# Patient Record
Sex: Male | Born: 1950 | Race: White | Hispanic: No | Smoking: Former smoker
Health system: Southern US, Community
[De-identification: ages and names within clinical notes are randomized; demographics above are authoritative.]

## PROBLEM LIST (undated history)

## (undated) DIAGNOSIS — D494 Neoplasm of unspecified behavior of bladder: Secondary | ICD-10-CM

## (undated) DIAGNOSIS — K219 Gastro-esophageal reflux disease without esophagitis: Secondary | ICD-10-CM

## (undated) DIAGNOSIS — Z974 Presence of external hearing-aid: Secondary | ICD-10-CM

## (undated) DIAGNOSIS — K824 Cholesterolosis of gallbladder: Secondary | ICD-10-CM

## (undated) DIAGNOSIS — R001 Bradycardia, unspecified: Secondary | ICD-10-CM

## (undated) DIAGNOSIS — Z972 Presence of dental prosthetic device (complete) (partial): Secondary | ICD-10-CM

## (undated) DIAGNOSIS — F1911 Other psychoactive substance abuse, in remission: Secondary | ICD-10-CM

## (undated) DIAGNOSIS — M1990 Unspecified osteoarthritis, unspecified site: Secondary | ICD-10-CM

## (undated) DIAGNOSIS — J449 Chronic obstructive pulmonary disease, unspecified: Secondary | ICD-10-CM

## (undated) DIAGNOSIS — I340 Nonrheumatic mitral (valve) insufficiency: Secondary | ICD-10-CM

## (undated) DIAGNOSIS — Z5189 Encounter for other specified aftercare: Secondary | ICD-10-CM

## (undated) DIAGNOSIS — R319 Hematuria, unspecified: Secondary | ICD-10-CM

## (undated) DIAGNOSIS — D649 Anemia, unspecified: Secondary | ICD-10-CM

## (undated) DIAGNOSIS — R011 Cardiac murmur, unspecified: Secondary | ICD-10-CM

## (undated) DIAGNOSIS — Z7901 Long term (current) use of anticoagulants: Secondary | ICD-10-CM

## (undated) DIAGNOSIS — I499 Cardiac arrhythmia, unspecified: Secondary | ICD-10-CM

## (undated) DIAGNOSIS — M810 Age-related osteoporosis without current pathological fracture: Secondary | ICD-10-CM

## (undated) HISTORY — PX: BROW LIFT AND BLEPHAROPLASTY: SHX1271

## (undated) HISTORY — PX: CATARACT EXTRACTION: SUR2

## (undated) HISTORY — PX: HERNIA REPAIR: SHX51

## (undated) HISTORY — PX: INGUINAL HERNIA REPAIR: SUR1180

## (undated) HISTORY — PX: MANDIBLE RECONSTRUCTION: SHX431

## (undated) HISTORY — PX: APPENDECTOMY: SHX54

## (undated) HISTORY — PX: MIDDLE EAR SURGERY: SHX713

## (undated) HISTORY — PX: HEMORROIDECTOMY: SUR656

## (undated) HISTORY — PX: UMBILICAL HERNIA REPAIR: SHX196

---

## 2002-05-16 ENCOUNTER — Emergency Department (HOSPITAL_COMMUNITY): Admission: EM | Admit: 2002-05-16 | Discharge: 2002-05-16 | Payer: Self-pay | Admitting: *Deleted

## 2007-10-10 HISTORY — PX: HEMORROIDECTOMY: SUR656

## 2008-08-19 ENCOUNTER — Ambulatory Visit: Payer: Self-pay | Admitting: Cardiology

## 2008-11-11 ENCOUNTER — Encounter (INDEPENDENT_AMBULATORY_CARE_PROVIDER_SITE_OTHER): Payer: Self-pay | Admitting: General Surgery

## 2008-11-11 ENCOUNTER — Observation Stay (HOSPITAL_COMMUNITY): Admission: RE | Admit: 2008-11-11 | Discharge: 2008-11-12 | Payer: Self-pay | Admitting: General Surgery

## 2010-07-28 ENCOUNTER — Emergency Department (HOSPITAL_COMMUNITY): Admission: EM | Admit: 2010-07-28 | Discharge: 2010-07-28 | Payer: Self-pay | Admitting: Emergency Medicine

## 2011-01-24 LAB — CBC
HCT: 42.2 % (ref 39.0–52.0)
Hemoglobin: 13.8 g/dL (ref 13.0–17.0)
MCHC: 32.7 g/dL (ref 30.0–36.0)
RDW: 21.9 % — ABNORMAL HIGH (ref 11.5–15.5)

## 2011-01-24 LAB — BASIC METABOLIC PANEL
CO2: 32 mEq/L (ref 19–32)
Calcium: 9.4 mg/dL (ref 8.4–10.5)
Glucose, Bld: 98 mg/dL (ref 70–99)
Potassium: 4.1 mEq/L (ref 3.5–5.1)
Sodium: 141 mEq/L (ref 135–145)

## 2011-01-24 LAB — DIFFERENTIAL
Basophils Absolute: 0 10*3/uL (ref 0.0–0.1)
Basophils Relative: 1 % (ref 0–1)
Eosinophils Relative: 4 % (ref 0–5)
Monocytes Absolute: 0.5 10*3/uL (ref 0.1–1.0)

## 2011-02-21 NOTE — Op Note (Signed)
NAMEJAYDIEN, Kenneth [name]                 ACCOUNT NO.:  [id number]   MEDICAL RECORD NO.:  [id number]          PATIENT TYPE:  OIB   LOCATION:  A309                          FACILITY:  [facility]   PHYSICIAN:  Barbaraann Barthel, M.D. DATE OF BIRTH:  July 31, 1951   DATE OF PROCEDURE:  DATE OF DISCHARGE:                               OPERATIVE REPORT   SURGEON:  Barbaraann Barthel, MD   PREOPERATIVE DIAGNOSIS:  Prolapsed internal hemorrhoids and external  hemorrhoids new (left lateral and right posterior bundles).   POSTOPERATIVE DIAGNOSIS:  Prolapsed internal hemorrhoids and external  hemorrhoids new (left lateral and right posterior bundles).   PROCEDURE:  Excision of prolapsed internal and external hemorrhoids  (left lateral and right posterior bundles).   SPECIMEN:  Hemorrhoids.   WOUND CLASSIFICATION:  Clean contaminated.   NOTE:  This is a 60 year old white male who [facility] because of recurring prolapsing and bleeding internal  hemorrhoids.  We had planned for elective surgery with him discussing  the complications not limited to but including bleeding, infection, and  recurrence.  We did discuss the possibility of some minor anal stricture  which hopefully will be avoided with proper maneuvers after surgery.  Informed consent was obtained.  It should be stated that this patient  had undergone a colonoscopy in November 2009, so we did not perform any  rigid proctoscopy.   GROSS OPERATIVE FINDINGS:  Those consistent with prolapsed internal and  external hemorrhoids located the left lateral and the right posterior  bundle area.   TECHNIQUE:  The patient was placed in the jackknife prone position after  the adequate administration of spinal anesthesia.  His perianal area was  prepped with Betadine solution and draped in the usual manner after the  buttocks were separated somewhat with wide tape separating them.  We  then excised the area around the left lateral  bundle and elevated this  and put a Buie clamp across this and removed the bundle with the cautery  device and sharply with the scalpel.  This was oversewn over the Buie  clamp from internal to external and then the clamp was removed and then  this was oversewn with a locking suture from external to the internal  direction.  After suturing and checking for hemostasis, attention was  then turned to the right posterior bundle where exactly the same  procedure was taken care of where the bundle was removed in likewise  manner, oversewing this over a oblique lamp after removing it in a  double fashion.  We checked for hemostasis.  The wound was irrigated  with saline solution and dilute Betadine  solution.  I used a perianal block with 10 mL of 0.5% Sensorcaine for  postoperative comfort and then placed a viscous Xylocaine dressing over  the wound.  The patient will be admitted for observation.  Estimated  blood loss was minimal.      Barbaraann Barthel, M.D.  Electronically Signed     WB/MEDQ  D:  11/11/2008  T:  11/11/2008  Job:  [id number]   cc:  [facility]

## 2011-08-30 ENCOUNTER — Encounter (HOSPITAL_COMMUNITY): Payer: Self-pay | Admitting: Pharmacy Technician

## 2011-09-05 NOTE — Consult Note (Signed)
NAMEVIAN, Kenneth [name] NO.:  [id number]  MEDICAL RECORD NO.:  [id number]  LOCATION:                                 FACILITY:  PHYSICIAN:  Barbaraann Barthel, M.D. DATE OF BIRTH:  1950/10/10  DATE OF CONSULTATION:  09/04/2011 DATE OF DISCHARGE:                                CONSULTATION   DIAGNOSIS:  Right inguinal hernia, recurrent.  HISTORY OF PRESENT MEDICAL ILLNESS:  The patient stated that he had repair previously in [facility] by Dr. Cleotis Nipper in the 1980s. Whether or not this was done by with a mesh prosthesis,  we are not certain and currently trying to obtain old medical records.  At any rate, the patient recently underwent some straining while moving some heavy material and noticed a lump in his groin and clinically, he has a mass in his right groin, consistent with a recurrence.  He was seen first at the [facility] who referred him to [place].  PHYSICAL EXAMINATION:  VITAL SIGNS:  He is 6 feet 2 inches, weighs 170 pounds.  His temperature is 97.6, his pulse rate is 46 and regular, respirations 12, blood pressure 130/82. HEENT:  Head is normocephalic.  Eyes, extraocular movements were intact. Pupils were round and reactive to light and accommodation.  There was no conjunctival pallor or scleral injection.  The sclerae has a normal tincture.  The patient has dental prosthesis and he has had a past history of a jaw reconstruction with bone grafts.  He has no cervical adenopathy and no thyromegaly and as stated, no bruits are auscultated. CHEST:  Clear to both anterior and posterior auscultation. HEART:  Regular rhythm. ABDOMEN:  Soft.  The patient has a recurrent right inguinal hernia. RECTAL:  The stool is guaiac negative.  There is a hard prostate on palpation. EXTREMITIES:  Within normal limits.  REVIEW OF SYSTEMS:  NEUROLOGIC:  No migraines, no seizures.  ENDOCRINE: No history of diabetes or thyroid disease.  CARDIOPULMONARY:   No history of chest pain or cardiac problems.  MUSCULOSKELETAL:  History of previous jaw reconstruction and he had previously broke both arms.  GI: No history of hepatitis, constipation, diarrhea, bright red rectal bleeding, or black tarry stools.  No history of inflammatory bowel disease or irritable bowel syndrome.  No history of unexplained weight loss.  He had a colonoscopy done in 2009.  GU:  No history of frequency or dysuria.  No history of kidney stones and as stated, he has rather firm, nodular prostate on examination.  We are obtaining PSA level on this.  PAST SURGICAL HISTORY:  Includes right inguinal hernia repair in 1980s, an umbilical hernia repair in 1983, jaw reconstruction in 2009, hemorrhoidectomy as well in 2009, and he had an appendectomy in 1964.  MEDICATIONS:  He is currently on no medications.  ALLERGIES:  He has no known allergies.  LABORATORY DATA:  Pending.  We will plan for surgery on him on elective basis and after which, he has returned to the [facility] for his medical followup after I finished following him perioperatively.     Barbaraann Barthel, M.D.     WB/MEDQ  D:  09/04/2011  T:  09/04/2011  Job:  [id number]  cc:   [facility]

## 2011-09-08 ENCOUNTER — Encounter (HOSPITAL_COMMUNITY): Payer: Self-pay

## 2011-09-08 ENCOUNTER — Encounter (HOSPITAL_COMMUNITY)
Admission: RE | Admit: 2011-09-08 | Discharge: 2011-09-08 | Disposition: A | Discharge status: Home / Self Care | Payer: Self-pay | Source: Ambulatory Visit | Attending: General Surgery | Admitting: General Surgery

## 2011-09-08 ENCOUNTER — Other Ambulatory Visit: Payer: Self-pay

## 2011-09-08 MED ORDER — CHLORHEXIDINE GLUCONATE 4 % EX LIQD: Freq: Once | CUTANEOUS | Status: DC

## 2011-09-08 NOTE — Patient Instructions (Signed)
20 Kenneth [name]  09/08/2011  Your procedure is scheduled on:  09/11/11  Report to [facility] at 0920 AM.  Call this number if you have problems the morning of surgery: [phone]   Remember:   Do not eat food:After Midnight.  May have clear liquids:until Midnight .  Clear liquids include soda, tea, black coffee, apple or grape juice, broth.  Take these medicines the morning of surgery with A SIP OF WATER: none   Do not wear jewelry, make-up or nail polish.  Do not wear lotions, powders, or perfumes. You may wear deodorant.  Do not shave 48 hours prior to surgery.  Do not bring valuables to the hospital.  Contacts, dentures or bridgework may not be worn into surgery.  Leave suitcase in the car. After surgery it may be brought to your room.  For patients admitted to the hospital, checkout time is 11:00 AM the day of discharge.   Patients discharged the day of surgery will not be allowed to drive home.  Name and phone number of your driver: driver  Special Instructions: CHG Shower Use Special Wash: 1/2 bottle night before surgery and 1/2 bottle morning of surgery.   Please read over the following fact sheets that you were given: MRSA Information, Surgical Site Infection Prevention, Anesthesia Post-op Instructions and Care and Recovery After Surgery   Inguinal Hernia, Adult  Care After Refer to this sheet in the next few weeks. These discharge instructions provide you with general information on caring for yourself after you leave the hospital. Your caregiver may also give you specific instructions. Your treatment has been planned according to the most current medical practices available, but unavoidable complications sometimes occur. If you have any problems or questions after discharge, please call your caregiver. HOME CARE INSTRUCTIONS  Put ice on the operative site.   Put ice in a plastic bag.   Place a towel between your skin and the bag.   Leave the ice on for 15 to 20 minutes  at a time, 3 to 4 times a day while awake.   Change bandages (dressings) as directed.   Keep the wound dry and clean. The wound may be washed gently with soap and water. Gently blot or dab the wound dry. It is okay to take showers 24 to 48 hours after surgery. Do not take baths, use swimming pools, or use hot tubs for 10 days, or as directed by your caregiver.   Only take over-the-counter or prescription medicines for pain, discomfort, or fever as directed by your caregiver.   Continue your normal diet as directed.   Do not lift anything more than 10 pounds or play contact sports for 3 weeks, or as directed.  SEEK MEDICAL CARE IF:  There is redness, swelling, or increasing pain in the wound.   There is fluid (pus) coming from the wound.   There is drainage from a wound lasting longer than 1 day.   You have an oral temperature above 102 F (38.9 C).   You notice a bad smell coming from the wound or dressing.   The wound breaks open after the stitches (sutures) have been removed.   You notice increasing pain in the shoulders (shoulder strap areas).   You develop dizzy episodes or fainting while standing.   You feel sick to your stomach (nauseous) or throw up (vomit).  SEEK IMMEDIATE MEDICAL CARE IF:  You develop a rash.   You have difficulty breathing.   You develop a  reaction or have side effects to medicines you were given.  MAKE SURE YOU:   Understand these instructions.   Will watch your condition.   Will get help right away if you are not doing well or get worse.  Document Released: 10/26/2006 Document Revised: 06/07/2011 Document Reviewed: 08/25/2009 [facility] Patient Information 2012 [place], [place].

## 2011-09-11 ENCOUNTER — Ambulatory Visit (HOSPITAL_COMMUNITY): Payer: Self-pay | Admitting: Anesthesiology

## 2011-09-11 ENCOUNTER — Other Ambulatory Visit: Payer: Self-pay | Admitting: General Surgery

## 2011-09-11 ENCOUNTER — Encounter (HOSPITAL_COMMUNITY): Payer: Self-pay | Admitting: *Deleted

## 2011-09-11 ENCOUNTER — Observation Stay (HOSPITAL_COMMUNITY)
Admission: RE | Admit: 2011-09-11 | Discharge: 2011-09-12 | Disposition: A | Discharge status: Home / Self Care | Payer: Self-pay | Source: Ambulatory Visit | Attending: General Surgery | Admitting: General Surgery

## 2011-09-11 ENCOUNTER — Encounter (HOSPITAL_COMMUNITY)
Admission: RE | Disposition: A | Discharge status: Home / Self Care | Payer: Self-pay | Source: Ambulatory Visit | Attending: General Surgery

## 2011-09-11 ENCOUNTER — Encounter (HOSPITAL_COMMUNITY): Payer: Self-pay | Admitting: Anesthesiology

## 2011-09-11 DIAGNOSIS — I498 Other specified cardiac arrhythmias: Secondary | ICD-10-CM | POA: Insufficient documentation

## 2011-09-11 DIAGNOSIS — K4090 Unilateral inguinal hernia, without obstruction or gangrene, not specified as recurrent: Secondary | ICD-10-CM

## 2011-09-11 DIAGNOSIS — R001 Bradycardia, unspecified: Secondary | ICD-10-CM

## 2011-09-11 DIAGNOSIS — K4091 Unilateral inguinal hernia, without obstruction or gangrene, recurrent: Principal | ICD-10-CM | POA: Insufficient documentation

## 2011-09-11 HISTORY — DX: Cardiac arrhythmia, unspecified: I49.9

## 2011-09-11 HISTORY — DX: Encounter for other specified aftercare: Z51.89

## 2011-09-11 HISTORY — DX: Anemia, unspecified: D64.9

## 2011-09-11 HISTORY — PX: INGUINAL HERNIA REPAIR: SHX194

## 2011-09-11 SURGERY — REPAIR, HERNIA, INGUINAL, ADULT
Anesthesia: Spinal | Site: Abdomen | Laterality: Right | Wound class: Clean

## 2011-09-11 MED ORDER — MIDAZOLAM HCL 2 MG/2ML IJ SOLN
INTRAMUSCULAR | Status: AC
  Filled 2011-09-11: qty 2

## 2011-09-11 MED ORDER — SODIUM CHLORIDE 0.9 % IR SOLN
Status: DC | PRN
  Administered 2011-09-11: 1000 mL

## 2011-09-11 MED ORDER — BUPIVACAINE HCL (PF) 0.5 % IJ SOLN
INTRAMUSCULAR | Status: AC
  Filled 2011-09-11: qty 30

## 2011-09-11 MED ORDER — BUPIVACAINE HCL 0.75 % IJ SOLN
INTRAMUSCULAR | Status: DC | PRN
  Administered 2011-09-11: 11 mg via INTRATHECAL

## 2011-09-11 MED ORDER — MIDAZOLAM HCL 2 MG/2ML IJ SOLN
1.0000 mg | INTRAMUSCULAR | Status: DC | PRN
  Administered 2011-09-11 (×2): 2 mg via INTRAVENOUS

## 2011-09-11 MED ORDER — ACETAMINOPHEN 325 MG PO TABS: 650.0000 mg | ORAL_TABLET | Freq: Four times a day (QID) | ORAL | Status: DC | PRN

## 2011-09-11 MED ORDER — ONDANSETRON HCL 4 MG/2ML IJ SOLN: 4.0000 mg | Freq: Once | INTRAMUSCULAR | Status: DC | PRN

## 2011-09-11 MED ORDER — ACETAMINOPHEN 650 MG RE SUPP: 650.0000 mg | Freq: Four times a day (QID) | RECTAL | Status: DC | PRN

## 2011-09-11 MED ORDER — EPHEDRINE SULFATE 50 MG/ML IJ SOLN
INTRAMUSCULAR | Status: DC | PRN
  Administered 2011-09-11: 10 mg via INTRAVENOUS

## 2011-09-11 MED ORDER — CEFAZOLIN SODIUM 1-5 GM-% IV SOLN: 1.0000 g | INTRAVENOUS | Status: DC

## 2011-09-11 MED ORDER — MORPHINE SULFATE 4 MG/ML IJ SOLN
1.0000 mg | INTRAMUSCULAR | Status: DC | PRN
  Administered 2011-09-11: 1 mg via INTRAVENOUS
  Filled 2011-09-11: qty 1

## 2011-09-11 MED ORDER — BUPIVACAINE HCL (PF) 0.5 % IJ SOLN
INTRAMUSCULAR | Status: DC | PRN
  Administered 2011-09-11: 10 mL

## 2011-09-11 MED ORDER — CEFAZOLIN SODIUM 1-5 GM-% IV SOLN
INTRAVENOUS | Status: AC
  Filled 2011-09-11: qty 50

## 2011-09-11 MED ORDER — FENTANYL CITRATE 0.05 MG/ML IJ SOLN
INTRAMUSCULAR | Status: AC
  Filled 2011-09-11: qty 2

## 2011-09-11 MED ORDER — EPHEDRINE SULFATE 50 MG/ML IJ SOLN
INTRAMUSCULAR | Status: AC
  Filled 2011-09-11: qty 1

## 2011-09-11 MED ORDER — MIDAZOLAM HCL 5 MG/5ML IJ SOLN
INTRAMUSCULAR | Status: DC | PRN
  Administered 2011-09-11: 2 mg via INTRAVENOUS

## 2011-09-11 MED ORDER — DOCUSATE SODIUM 100 MG PO CAPS
100.0000 mg | ORAL_CAPSULE | Freq: Two times a day (BID) | ORAL | Status: DC
  Administered 2011-09-11 – 2011-09-12 (×2): 100 mg via ORAL
  Filled 2011-09-11 (×2): qty 1

## 2011-09-11 MED ORDER — CEFAZOLIN SODIUM 1-5 GM-% IV SOLN
INTRAVENOUS | Status: DC | PRN
  Administered 2011-09-11: 1 g via INTRAVENOUS

## 2011-09-11 MED ORDER — POTASSIUM CHLORIDE IN NACL 20-0.9 MEQ/L-% IV SOLN
INTRAVENOUS | Status: DC
  Administered 2011-09-11 – 2011-09-12 (×2): via INTRAVENOUS

## 2011-09-11 MED ORDER — FENTANYL CITRATE 0.05 MG/ML IJ SOLN: 25.0000 ug | INTRAMUSCULAR | Status: DC | PRN

## 2011-09-11 MED ORDER — LACTATED RINGERS IV SOLN
INTRAVENOUS | Status: DC
  Administered 2011-09-11: 10:00:00 via INTRAVENOUS

## 2011-09-11 MED ORDER — FENTANYL CITRATE 0.05 MG/ML IJ SOLN
INTRAMUSCULAR | Status: DC | PRN
  Administered 2011-09-11: 20 ug via INTRATHECAL
  Administered 2011-09-11: 50 ug via INTRAVENOUS

## 2011-09-11 MED ORDER — GLYCOPYRROLATE 0.2 MG/ML IJ SOLN
INTRAMUSCULAR | Status: DC | PRN
  Administered 2011-09-11: 0.2 mg via INTRAVENOUS

## 2011-09-11 MED ORDER — STERILE WATER FOR IRRIGATION IR SOLN
Status: DC | PRN
  Administered 2011-09-11: 2000 mL

## 2011-09-11 MED ORDER — BACITRACIN 50000 UNITS IM SOLR
INTRAMUSCULAR | Status: AC
  Filled 2011-09-11: qty 50000

## 2011-09-11 MED ORDER — PROPOFOL 10 MG/ML IV EMUL
INTRAVENOUS | Status: DC | PRN
  Administered 2011-09-11: 25 ug/kg/min via INTRAVENOUS

## 2011-09-11 MED ORDER — GLYCOPYRROLATE 0.2 MG/ML IJ SOLN
INTRAMUSCULAR | Status: AC
  Filled 2011-09-11: qty 1

## 2011-09-11 MED ORDER — BUPIVACAINE IN DEXTROSE 0.75-8.25 % IT SOLN
INTRATHECAL | Status: AC
  Filled 2011-09-11: qty 2

## 2011-09-11 MED ORDER — ONDANSETRON HCL 4 MG/2ML IJ SOLN: 4.0000 mg | Freq: Four times a day (QID) | INTRAMUSCULAR | Status: DC | PRN

## 2011-09-11 SURGICAL SUPPLY — 48 items
ATTRACTOMAT 16X20 MAGNETIC DRP (DRAPES) ×2 IMPLANT
BAG HAMPER (MISCELLANEOUS) ×2 IMPLANT
CLOTH BEACON ORANGE TIMEOUT ST (SAFETY) ×2 IMPLANT
COVER LIGHT HANDLE STERIS (MISCELLANEOUS) ×4 IMPLANT
DECANTER SPIKE VIAL GLASS SM (MISCELLANEOUS) ×2 IMPLANT
DRAIN PENROSE 12X.25 LTX STRL (MISCELLANEOUS) ×2 IMPLANT
DRESSING ALLEVYN BORDER HEEL (GAUZE/BANDAGES/DRESSINGS) ×2 IMPLANT
DRSG TEGADERM 2-3/8X2-3/4 SM (GAUZE/BANDAGES/DRESSINGS) ×2 IMPLANT
DRSG TEGADERM 4X4.75 (GAUZE/BANDAGES/DRESSINGS) ×2 IMPLANT
ELECT REM PT RETURN 9FT ADLT (ELECTROSURGICAL) ×2
ELECTRODE REM PT RTRN 9FT ADLT (ELECTROSURGICAL) ×1 IMPLANT
FORMALIN 10 PREFIL 120ML (MISCELLANEOUS) ×2 IMPLANT
GLOVE ECLIPSE 6.5 STRL STRAW (GLOVE) ×2 IMPLANT
GLOVE ECLIPSE 7.0 STRL STRAW (GLOVE) ×2 IMPLANT
GLOVE EXAM NITRILE MD LF STRL (GLOVE) ×2 IMPLANT
GLOVE INDICATOR 7.0 STRL GRN (GLOVE) ×2 IMPLANT
GLOVE INDICATOR 7.5 STRL GRN (GLOVE) ×2 IMPLANT
GLOVE SKINSENSE NS SZ7.0 (GLOVE) ×1
GLOVE SKINSENSE STRL SZ7.0 (GLOVE) ×1 IMPLANT
GOWN STRL REIN XL XLG (GOWN DISPOSABLE) ×6 IMPLANT
INST SET MINOR GENERAL (KITS) ×2 IMPLANT
KIT ROOM TURNOVER APOR (KITS) ×2 IMPLANT
MANIFOLD NEPTUNE II (INSTRUMENTS) ×2 IMPLANT
NEEDLE HYPO 25X1 1.5 SAFETY (NEEDLE) ×2 IMPLANT
NS IRRIG 1000ML POUR BTL (IV SOLUTION) ×2 IMPLANT
PACK MINOR (CUSTOM PROCEDURE TRAY) ×2 IMPLANT
PAD ARMBOARD 7.5X6 YLW CONV (MISCELLANEOUS) ×2 IMPLANT
PENCIL HANDSWITCHING (ELECTRODE) ×2 IMPLANT
SET BASIN LINEN APH (SET/KITS/TRAYS/PACK) ×2 IMPLANT
SOL PREP PROV IODINE SCRUB 4OZ (MISCELLANEOUS) ×2 IMPLANT
SPONGE GAUZE 4X4 12PLY (GAUZE/BANDAGES/DRESSINGS) ×2 IMPLANT
SPONGE INTESTINAL PEANUT (DISPOSABLE) ×2 IMPLANT
SPONGE LAP 18X18 X RAY DECT (DISPOSABLE) ×2 IMPLANT
STAPLER VISISTAT 35W (STAPLE) ×2 IMPLANT
SUT NOVA NAB GS-22 2 2-0 T-19 (SUTURE) IMPLANT
SUT NUROLON NAB CT 2 2-0 18IN (SUTURE) IMPLANT
SUT PROLENE 0 CT 1 CR/8 (SUTURE) IMPLANT
SUT SILK 2 0 (SUTURE) ×2
SUT SILK 2-0 18XBRD TIE 12 (SUTURE) ×1 IMPLANT
SUT VIC AB 0 CT1 27 (SUTURE) ×2
SUT VIC AB 0 CT1 27XBRD ANTBC (SUTURE) ×1 IMPLANT
SUT VIC AB 3-0 SH 27 (SUTURE) ×2
SUT VIC AB 3-0 SH 27X BRD (SUTURE) ×1 IMPLANT
SUT VICRYL AB 3 0 TIES (SUTURE) ×2 IMPLANT
SYR BULB IRRIGATION 50ML (SYRINGE) ×2 IMPLANT
SYR CONTROL 10ML LL (SYRINGE) ×2 IMPLANT
TRAY FOLEY CATH 14FR (SET/KITS/TRAYS/PACK) ×2 IMPLANT
WATER STERILE IRR 1000ML POUR (IV SOLUTION) ×4 IMPLANT

## 2011-09-11 NOTE — Anesthesia Preprocedure Evaluation (Signed)
Anesthesia Evaluation  Patient identified by MRN, date of birth, ID band Patient awake    Reviewed: Allergy & Precautions, H&P , NPO status , Patient's Chart, lab work & pertinent test results  History of Anesthesia Complications Negative for: history of anesthetic complications  Airway Mallampati: I      Dental  (+) Edentulous Upper and Partial Lower   Pulmonary neg pulmonary ROS, former smoker clear to auscultation        Cardiovascular neg cardio ROS Regular Bradycardia    Neuro/Psych    GI/Hepatic (+)     substance abuse (none for many years.)  alcohol use,   Endo/Other    Renal/GU      Musculoskeletal   Abdominal   Peds  Hematology   Anesthesia Other Findings   Reproductive/Obstetrics                           Anesthesia Physical Anesthesia Plan  ASA: II  Anesthesia Plan: Spinal   Post-op Pain Management:    Induction:   Airway Management Planned: Nasal Cannula  Additional Equipment:   Intra-op Plan:   Post-operative Plan:   Informed Consent: I have reviewed the patients History and Physical, chart, labs and discussed the procedure including the risks, benefits and alternatives for the proposed anesthesia with the patient or authorized representative who has indicated his/her understanding and acceptance.     Plan Discussed with:   Anesthesia Plan Comments:         Anesthesia Quick Evaluation

## 2011-09-11 NOTE — Anesthesia Procedure Notes (Signed)
Spinal  Patient location during procedure: OR Start time: 09/11/2011 12:03 PM Staffing CRNA/Resident: Glynn Octave Preanesthetic Checklist Completed: patient identified, site marked, surgical consent, pre-op evaluation, timeout performed, IV checked, risks and benefits discussed and monitors and equipment checked Spinal Block Patient position: sitting Prep: Betadine Patient monitoring: heart rate, cardiac monitor, continuous pulse ox and blood pressure Approach: midline Location: L3-4 Injection technique: single-shot Needle Needle type: Spinocan  Needle gauge: 22 G Needle length: 9 cm Assessment Sensory level: T8 Additional Notes 1203:  Marcaine .75%, 11mg , with fentanyl injected.  CSF aspiration pre and post injection.   Tray #[id number],  Exp. Date 05/2012

## 2011-09-11 NOTE — Progress Notes (Signed)
Pt continues to demonstrate sinus bradycardia with rate of 32-38. Dr. Malvin Johns notified. Order to be call per MD for cardiac consult.

## 2011-09-11 NOTE — Transfer of Care (Signed)
Immediate Anesthesia Transfer of Care Note  Patient: Kenneth [name]  Procedure(s) Performed:  HERNIA REPAIR INGUINAL ADULT - Recurrent Right Inguinal Hernia Repair  Patient Location: PACU  Anesthesia Type: Spinal  Level of Consciousness: awake, alert  and oriented  Airway & Oxygen Therapy: Patient Spontanous Breathing  Post-op Assessment: Report given to PACU RN  Post vital signs: Reviewed and stable  Complications: No apparent anesthesia complications

## 2011-09-11 NOTE — Op Note (Signed)
NAMEJOACHIM, CARTON NO.:  [id number]  MEDICAL RECORD NO.:  [id number]  LOCATION:  A337                          FACILITY:  [facility]  PHYSICIAN:  Barbaraann Barthel, M.D. DATE OF BIRTH:  Aug 25, 1951  DATE OF PROCEDURE: DATE OF DISCHARGE:                              OPERATIVE REPORT   PREOPERATIVE DIAGNOSIS:  Recurrent right inguinal hernia.  POSTOPERATIVE DIAGNOSIS:  Recurrent right inguinal hernia.  PROCEDURE:  Right inguinal herniorrhaphy without the use of mesh.  NOTE:  This is a 60 year old white male who had a right inguinal hernia repair by Dr. Cleotis Nipper in the 1980s.  He felt a mass in his right groin increased in size and become more discomforting to him.  He was referred by the [facility].  We discussed complications of repair including bleeding, infection, and recurrence.  Informed consent was obtained.  SPECIMEN:  Right inguinal hernia sac.  WOUND CLASSIFICATION:  Clean.  GROSS OPERATIVE FINDINGS:  The patient had an indirect inguinal hernia with a nonincarcerated defect noted.  TECHNIQUE:  The patient was placed in a supine position after the adequate administration of spinal anesthesia.  He was prepped with Betadine solution and draped in usual manner.  Prior to this, a Foley catheter was aseptically inserted.  After prepping and draping, an incision was carried out over the previous incision through skin, subcutaneous tissue down to the external oblique which was opened.  We found the hernia without any problem. There did not appear to be any direct defect in the abdomen and the floor appeared intact.  We then performed a high ligation with 2-0 Bralon sutures of the hernia sac and removed the redundant portion.  I repaired the external oblique over the hernia sac.  The cord structures were left intact.  There was some rather spermatic veins, but these were left intact without any injury and after the high ligation, this was sent as a  specimen.  The wound was then irrigated with normal saline solution and the subcutaneous layer was approximated with 3-0 Polysorb and the skin was approximated with stapling device.  Just after closing the fascia layer, about 10 mL of 0.5% Sensorcaine were used for postoperative comfort.  After closing the wound with a stapling device, a sterile OpSite dressing was applied and the Foley was left intact until he is ambulating.  There were no complications.  The patient received approximately 800 mL of crystalloids intraoperatively and no drains were placed.     Barbaraann Barthel, M.D.     WB/MEDQ  D:  09/11/2011  T:  09/11/2011  Job:  [id number]  cc:   Dr. Emelda Fear

## 2011-09-11 NOTE — Anesthesia Postprocedure Evaluation (Signed)
  Anesthesia Post-op Note  Patient: Kenneth [name]  Procedure(s) Performed:  HERNIA REPAIR INGUINAL ADULT - Recurrent Right Inguinal Hernia Repair  Patient Location: PACU  Anesthesia Type: Spinal  Level of Consciousness: awake, alert  and oriented  Airway and Oxygen Therapy: Patient Spontanous Breathing  Post-op Pain: none  Post-op Assessment: Post-op Vital signs reviewed, Patient's Cardiovascular Status Stable, Respiratory Function Stable and No signs of Nausea or vomiting  Post-op Vital Signs: Reviewed and stable  Complications: No apparent anesthesia complications

## 2011-09-11 NOTE — Progress Notes (Signed)
Dr Orson Gear office was notified of new consult.  Dr.  Malvin Johns also said he attempted to call.  Dr. Malvin Johns attempted after hours but there was not an oncall number available.  I told the patient that the Dr. Eulah Citizen most likely see him on tomorrow.

## 2011-09-11 NOTE — Progress Notes (Signed)
Pt has some concerns about seeing the Cardiologist.  He is worried about making a new bill.  He states that he [facility] the [facility] and they said they would pay for what they sent him to the hospital for, and that Dr. Malvin Johns was nice enough to do the surgery but he can not afford to see the Cardiologist.  He says he would rather wait until he can call the [facility] in the am.  He says if the [facility] will not cover in the hospital, then he would not like to see them while admitted, but maybe follow up with them if the [facility] will pay.   I have paged Dr. Malvin Johns and will pass on to the next nurse the patients concerns.  I will ask her to have the am nurse notify Dr. Malvin Johns in the am. Also I will place a sticky note.

## 2011-09-11 NOTE — Progress Notes (Signed)
Dr. Jayme Cloud aware of the Bradycardia with rate of 32-38 sinus. BP stable. Pt asymptomic Dr. Malvin Johns aware Telemetry ordered.

## 2011-09-11 NOTE — Progress Notes (Signed)
Post OP Check  Pt feels good, no chest pain but has persistent bradycardia.  I have asked cardiology to see.  Pre op he had rates around 50's.  Wound clean and dry.   No headache from spinal.  Will D/C foley in AM.   Filed Vitals:   09/11/11 1620  BP: 160/90  Pulse: 34  Temp: 97.1 F (36.2 C)  Resp: 24

## 2011-09-11 NOTE — Brief Op Note (Signed)
09/11/2011  1:12 PM  PATIENT:  Kenneth [name]  60 y.o. male  PRE-OPERATIVE DIAGNOSIS:  Recurrent Right inguinal hernia  POST-OPERATIVE DIAGNOSIS:  Recurrent Right inguinal hernia  PROCEDURE:  Procedure(s): HERNIA REPAIR INGUINAL ADULT  SURGEON:  Surgeon(s): Marlane Hatcher  PHYSICIAN ASSISTANT:   ASSISTANTS: none   ANESTHESIA:   spinal  EBL:  Total I/O In: 900 [I.V.:900] Out: 310 [Urine:300; Blood:10]  BLOOD ADMINISTERED:none  DRAINS: none   LOCAL MEDICATIONS USED:  MARCAINE 10CC  SPECIMEN:  Source of Specimen:  Right Ing. Hernia sac  DISPOSITION OF SPECIMEN:  PATHOLOGY  COUNTS:  YES  TOURNIQUET:  * No tourniquets in log *  DICTATION: .Other Dictation: Dictation Number OR dict. # [id number]  PLAN OF CARE: Admit for overnight observation  PATIENT DISPOSITION:  PACU - hemodynamically stable.   Delay start of Pharmacological VTE agent (>24hrs) due to surgical blood loss or risk of bleeding:  {YES/NO/NOT APPLICABLE:20182

## 2011-09-11 NOTE — Progress Notes (Signed)
49 yr. Old W. Male with recurrent RIH for elective repair.  Procedure and risks explained and informed consent obtained.  Labs reviewed and operative site marked.  Filed Vitals:   09/11/11 0939  BP: 149/85  Pulse: 49  Temp: 98.1 F (36.7 C)  Resp: 24   H&P dict.# [id number].  No clinical changes since H&P.

## 2011-09-12 ENCOUNTER — Encounter: Payer: Self-pay | Admitting: Adult Health

## 2011-09-12 ENCOUNTER — Encounter (HOSPITAL_COMMUNITY): Payer: Self-pay | Admitting: Adult Health

## 2011-09-12 DIAGNOSIS — R001 Bradycardia, unspecified: Secondary | ICD-10-CM

## 2011-09-12 DIAGNOSIS — I498 Other specified cardiac arrhythmias: Secondary | ICD-10-CM

## 2011-09-12 DIAGNOSIS — K4090 Unilateral inguinal hernia, without obstruction or gangrene, not specified as recurrent: Secondary | ICD-10-CM

## 2011-09-12 MED ORDER — DSS 100 MG PO CAPS: 100.0000 mg | ORAL_CAPSULE | Freq: Two times a day (BID) | ORAL | Status: AC

## 2011-09-12 NOTE — Progress Notes (Signed)
CARE MANAGEMENT NOTE 09/12/2011  Patient:  Kenneth [name],Kenneth [name]   Account Number:  [id number]  Date Initiated:  09/12/2011  Documentation initiated by:  Damoney Julia  Subjective/Objective Assessment:   Pt admitted after R Inguinal hernia repair. PTA lived at home with family.  After surgery pt became bradycardia and cardiac consult ordered. Pt is self pay and requested assistance from the [facility].     Action/Plan:   Per Kristi at [facility], patient is 100% [facility] and [place] usually does these consults. Pt advised, RN advised   Anticipated DC Date:  09/12/2011   Anticipated DC Plan:  HOME/SELF CARE  In-house referral  Financial Counselor      DC Planning Services  CM consult      Choice offered to / List presented to:             Status of service:  Completed, signed off Medicare Important Message given?   (If response is "NO", the following Medicare IM given date fields will be blank) Date Medicare IM given:   Date Additional Medicare IM given:    Discharge Disposition:  HOME/SELF CARE  Per UR Regulation:    Comments:  09/12/11 09:30 Makoa Satz RN BSN CM [facility] discharged with no HH needs identified.  

## 2011-09-12 NOTE — Addendum Note (Signed)
Addendum  created 09/12/11 7829 by Despina Hidden   Modules edited:Notes Section

## 2011-09-12 NOTE — Progress Notes (Addendum)
POD 1  Wound clean and redressed.  Min. Discomfort.  Voiding well without foley.  Pt. Has seen cardiology about bradycardia and they will follow up.  Filed Vitals:   09/12/11 1033  BP: 165/89  Pulse: 57  Temp: 98.1 F (36.7 C)  Resp: 20   Discharge and follow up arranged.  Home today after cardiac clearance.  Discharge dict. # [id number]

## 2011-09-12 NOTE — Progress Notes (Signed)
CARE MANAGEMENT NOTE 09/12/2011  Patient:  Kenneth [name], Kenneth [name]   Account Number:  [id number]  Date Initiated:  09/12/2011  Documentation initiated by:  Rosemary Holms  Subjective/Objective Assessment:   Pt admitted after R Inguinal hernia repair. PTA lived at home with family.  After surgery pt became bradycardia and cardiac consult ordered. Pt is self pay and requested assistance from the [facility]     Action/Plan:   Per Silva Bandy at [facility], patient is 100% [facility] and [place] usually does these consults. Pt advised, RN advised   Anticipated DC Date:  09/12/2011   Anticipated DC Plan:  HOME/SELF CARE  In-house referral  Financial Counselor      DC Planning Services  CM consult      Choice offered to / List presented to:             Status of service:  In process, will continue to follow Medicare Important Message given?   (If response is "NO", the following Medicare IM given date fields will be blank) Date Medicare IM given:   Date Additional Medicare IM given:    Discharge Disposition:    Per UR Regulation:    Comments:  09/12/11 09:30 Kanaya Gunnarson Le[facility]ord [facility] RN BSN CM
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
## 2011-09-12 NOTE — Anesthesia Postprocedure Evaluation (Signed)
  Anesthesia Post-op Note  Patient: Kenneth [name]  Procedure(s) Performed:  HERNIA REPAIR INGUINAL ADULT - Recurrent Right Inguinal Hernia Repair  Patient Location: room 337  Anesthesia Type: Spinal  Level of Consciousness: awake, alert , oriented and patient cooperative  Airway and Oxygen Therapy: Patient Spontanous Breathing  Post-op Pain: none  Post-op Assessment: Post-op Vital signs reviewed, Patient's Cardiovascular Status Stable, Respiratory Function Stable, Patent Airway, No signs of Nausea or vomiting, Adequate PO intake and Pain level controlled  Post-op Vital Signs: Reviewed and stable  Complications: No apparent anesthesia complications

## 2011-09-12 NOTE — Progress Notes (Signed)
CSW received referral regarding pt's request to be seen at the [facility].  CM notified.  No other CSW needs reported.  CSW to sign off.  Kenneth [name]

## 2011-09-12 NOTE — Progress Notes (Signed)
D/c instructions reviewed with patient.  Verbalized understanding. Pt dc'd to home with friend.  Sch[place]witz, Candelaria Stagers 09/12/2011

## 2011-09-12 NOTE — Progress Notes (Signed)
UR Chart Review Completed  

## 2011-09-13 NOTE — Discharge Summary (Signed)
Kenneth [name], Kenneth [name] NO.:  [id number]  MEDICAL RECORD NO.:  [id number]  LOCATION:  A337                          FACILITY:  [facility]  PHYSICIAN:  Barbaraann Barthel, M.D. DATE OF BIRTH:  1951/06/29  DATE OF ADMISSION:  09/11/2011 DATE OF DISCHARGE:  12/04/2012LH                              DISCHARGE SUMMARY   DIAGNOSIS:  Right recurrent inguinal hernia.  PROCEDURE:  On September 11, 2011, repair of right inguinal recurrent hernia with high ligation and without the use of mesh.  SECONDARY DIAGNOSIS:  Bradycardia, asymptomatic.  CONSULTATIONS:  [facility].  NOTE:  This is a 60 year old white male who had recurrent right inguinal hernia.  This was repaired by Dr. Cleotis Nipper in the 1980s.  I obtained old records and he did not have any repair with mesh at that time.  He was taken to surgery after being cleared by the clinic and we did notice that he had heart rates in the mid 50s in my office.  However electrocardiogram was normal and he did not have any episodes of chest pain or any cardiac symptoms.  He was taken to surgery via the outpatient department and his recurrent right inguinal hernia was repaired without problem.  His hospital course was completely uneventful.  He was discharged on the 1st postoperative day.  I did want him to be seen by Cardiology because he did have episodes of his heart rate going into the 30s, however he remained asymptomatic.  Dr. Dietrich Pates saw him and arrangements will be made to follow up with this issue as an outpatient as per their choice. He will be seen by the Cardiology Clinic on the 20th of December at 11:40 a.m. and other plans are being made for an abdominal sonogram.  For his laboratory data, please consult chart.  He is discharged, feeling much better.  At the time of discharge, his wound was clean. His dressings were changed.  He had no sign of inflammation.  He was voiding without dysuria.  He had no leg pain or  shortness of breath and as stated, had no symptoms for his bradycardia.  At the time of discharge, his blood pressure was 165/89, his heart rate was 57, his respirations were 20 and regular and he was afebrile at 98.1 degrees.  We will follow up with him perioperatively after which he is to return to the [facility].     Barbaraann Barthel, M.D.     WB/MEDQ  D:  09/12/2011  T:  09/12/2011  Job:  [id number]  cc:   Gerrit Friends. Dietrich Pates, MD, [facility] [address] [place], [place] [address]  [facility]

## 2011-09-14 ENCOUNTER — Encounter (HOSPITAL_COMMUNITY): Payer: Self-pay | Admitting: General Surgery

## 2011-09-19 ENCOUNTER — Other Ambulatory Visit: Payer: Self-pay | Admitting: Obstetrics and Gynecology

## 2011-09-19 DIAGNOSIS — K469 Unspecified abdominal hernia without obstruction or gangrene: Secondary | ICD-10-CM

## 2011-09-25 ENCOUNTER — Ambulatory Visit (HOSPITAL_COMMUNITY)
Admission: RE | Admit: 2011-09-25 | Discharge: 2011-09-25 | Disposition: A | Discharge status: Home / Self Care | Payer: Self-pay | Source: Ambulatory Visit | Attending: Obstetrics and Gynecology | Admitting: Obstetrics and Gynecology

## 2011-09-25 DIAGNOSIS — K469 Unspecified abdominal hernia without obstruction or gangrene: Secondary | ICD-10-CM | POA: Insufficient documentation

## 2011-09-25 DIAGNOSIS — K824 Cholesterolosis of gallbladder: Secondary | ICD-10-CM | POA: Insufficient documentation

## 2011-09-28 ENCOUNTER — Ambulatory Visit (INDEPENDENT_AMBULATORY_CARE_PROVIDER_SITE_OTHER): Payer: Self-pay | Admitting: Adult Health

## 2011-09-28 ENCOUNTER — Encounter: Payer: Self-pay | Admitting: Adult Health

## 2011-09-28 VITALS — BP 129/75 | HR 43 | Ht 71.5 in | Wt 185.0 lb

## 2011-09-28 DIAGNOSIS — I498 Other specified cardiac arrhythmias: Secondary | ICD-10-CM

## 2011-09-28 DIAGNOSIS — R001 Bradycardia, unspecified: Secondary | ICD-10-CM

## 2011-09-28 NOTE — Assessment & Plan Note (Signed)
He is essentially asymptomatic but is complaining now of more fatigue.  TSH was checked last month and found to be 1.077. He is not on any rate reducing medications, in fact, he is not on any medications.  Abdominal ultrasound is reassuring for AAA, as this was not found with family history of same.  Will place a 21 day heart monitor to evaluate for pauses, and significant bradycardia which may require pacemaker. He had a stress test at [facility] in 2009 which was found to be normal. Can consider repeating this should he become symptomatic.
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
## 2011-09-28 NOTE — Patient Instructions (Signed)
Your physician has recommended that you wear an event monitor. Event monitors are medical devices that record the heart's electrical activity. Doctors most often [place] these monitors to diagnose arrhythmias. Arrhythmias are problems with the speed or rhythm of the heartbeat. The monitor is a small, portable device. You can wear one while you do your normal daily activities. This is usually used to diagnose what is causing palpitations/syncope (passing out).  Your physician recommends that you schedule a follow-up appointment in: 1 month

## 2011-10-17 ENCOUNTER — Telehealth: Payer: Self-pay | Admitting: Adult Health

## 2011-10-17 NOTE — Telephone Encounter (Signed)
**Note De-Identified Kadin Bera Obfuscation** Dan with [employer] states that they have been trying to reach pt. without success and asked that we have pt. call [phone] so they can send out event monitor. Pt. advised, he stated he would call them today./LV

## 2011-10-17 NOTE — Telephone Encounter (Signed)
PT STATES THAT WE ORDERED A EVENT MONITOR FOR HIM AT LAST OFFICE VISIT ABOUT THREE WEEKS AGO AND HE HAS NOT RECEIVED IT YET.

## 2011-10-19 ENCOUNTER — Telehealth: Payer: Self-pay

## 2011-10-19 ENCOUNTER — Telehealth: Payer: Self-pay | Admitting: Adult Health

## 2011-10-19 NOTE — Telephone Encounter (Signed)
Dawn, [employer] representative, states that they have an assistance program that helps people without insurance and that they will contact pt. With details. No answer and no way to leave message on pt's home phone. Neysa Bonito is advised and states that she will try to notify pt.

## 2011-10-19 NOTE — Telephone Encounter (Signed)
Kenneth [name] wanted to know if there was any other tests that we could do on patient due to [employer] costing $800.  Patient does not have insurance. / tg

## 2011-10-30 ENCOUNTER — Ambulatory Visit (HOSPITAL_COMMUNITY)
Admission: RE | Admit: 2011-10-30 | Discharge: 2011-10-30 | Disposition: A | Discharge status: Home / Self Care | Payer: Self-pay | Source: Ambulatory Visit | Attending: Adult Health | Admitting: Adult Health

## 2011-10-30 ENCOUNTER — Encounter: Payer: Self-pay | Admitting: Adult Health

## 2011-10-30 ENCOUNTER — Other Ambulatory Visit: Payer: Self-pay

## 2011-10-30 DIAGNOSIS — I498 Other specified cardiac arrhythmias: Secondary | ICD-10-CM | POA: Insufficient documentation

## 2011-10-30 DIAGNOSIS — R001 Bradycardia, unspecified: Secondary | ICD-10-CM

## 2011-10-30 NOTE — Progress Notes (Signed)
*  PRELIMINARY RESULTS* Echocardiogram 48H Holter monitor has been performed.  Kenneth [name] 10/30/2011, 1:31 PM

## 2011-11-06 ENCOUNTER — Ambulatory Visit (INDEPENDENT_AMBULATORY_CARE_PROVIDER_SITE_OTHER): Payer: Self-pay | Admitting: Adult Health

## 2011-11-06 ENCOUNTER — Encounter: Payer: Self-pay | Admitting: Adult Health

## 2011-11-06 VITALS — BP 122/70 | HR 52 | Ht 71.5 in | Wt 181.0 lb

## 2011-11-06 DIAGNOSIS — I498 Other specified cardiac arrhythmias: Secondary | ICD-10-CM

## 2011-11-06 DIAGNOSIS — R001 Bradycardia, unspecified: Secondary | ICD-10-CM

## 2011-11-06 NOTE — Patient Instructions (Signed)
Your physician recommends that you schedule a follow-up appointment in: As needed  

## 2011-11-06 NOTE — Assessment & Plan Note (Signed)
Holter monitor read by Dr. Dietrich Pates "NSR SB, ST. Max 133 (sinus), Frequent PVC's, occasional PAC's. 10 beat SVT. No symptoms"  There was no indication for Pacemaker placement. We will watch and wait. He will be seen on PRN basis.
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
## 2011-11-07 NOTE — Procedures (Signed)
[name], Kenneth                 ACCOUNT NO.:  [id number]  MEDICAL RECORD NO.:  [id number]  LOCATION:  CARDIOPU                      FACILITY:  [facility]  PHYSICIAN:  Gerrit Friends. Dietrich Pates, MD, FACCDATE OF BIRTH:  [birth year]  DATE OF PROCEDURE:  10/30/2011 DATE OF DISCHARGE:  10/30/2011                               HOLTER MONITOR   CLINICAL DATA:  A 61 year old gentleman with bradycardia. 1. Continuous electrocardiographic recording was maintained for 47     hours and 37 minutes during which the predominant rhythm was normal     sinus and sinus bradycardia with some sinus tachycardia.  An IVCD     was present throughout.  Peak heart rate was 133, while the minimal     heart rate recorded was 39 BPM. 2. Frequent ventricular ectopics were recorded, occurring at an     average rate of 150 per hour.  Occasional PVC pairs were also     present. 3. Much less frequent premature supraventricular ectopic activity was     identified, occurring at an average rate of 13 per hour.  A single     episode of supraventricular tachycardia was identified lasting 13     beats and with a cycle length of 440 msec. 4. A complete diary of activity was returned, but no symptoms were     reported. 5. No significant ST-segment elevation or depression was seen.     Gerrit Friends. Dietrich Pates, MD, [facility]     RMR/MEDQ  D:  11/06/2011  T:  11/07/2011  Job:  [id number]

## 2013-01-28 NOTE — Progress Notes (Signed)
Patient ID: Kenneth [name], male   DOB: March 25, 1951, 62 y.o.   MRN: [id number] Cancelled

## 2014-05-15 ENCOUNTER — Emergency Department (HOSPITAL_COMMUNITY)
Admission: EM | Admit: 2014-05-15 | Discharge: 2014-05-15 | Disposition: A | Discharge status: Home / Self Care | Payer: Self-pay | Attending: Emergency Medicine | Admitting: Emergency Medicine

## 2014-05-15 ENCOUNTER — Encounter (HOSPITAL_COMMUNITY): Payer: Self-pay | Admitting: Emergency Medicine

## 2014-05-15 DIAGNOSIS — T1490XA Injury, unspecified, initial encounter: Secondary | ICD-10-CM

## 2014-05-15 DIAGNOSIS — Z8679 Personal history of other diseases of the circulatory system: Secondary | ICD-10-CM | POA: Insufficient documentation

## 2014-05-15 DIAGNOSIS — S4980XA Other specified injuries of shoulder and upper arm, unspecified arm, initial encounter: Secondary | ICD-10-CM | POA: Insufficient documentation

## 2014-05-15 DIAGNOSIS — Z87891 Personal history of nicotine dependence: Secondary | ICD-10-CM | POA: Insufficient documentation

## 2014-05-15 DIAGNOSIS — X500XXA Overexertion from strenuous movement or load, initial encounter: Secondary | ICD-10-CM | POA: Insufficient documentation

## 2014-05-15 DIAGNOSIS — Z792 Long term (current) use of antibiotics: Secondary | ICD-10-CM | POA: Insufficient documentation

## 2014-05-15 DIAGNOSIS — Z862 Personal history of diseases of the blood and blood-forming organs and certain disorders involving the immune mechanism: Secondary | ICD-10-CM | POA: Insufficient documentation

## 2014-05-15 DIAGNOSIS — S40022A Contusion of left upper arm, initial encounter: Secondary | ICD-10-CM

## 2014-05-15 DIAGNOSIS — S46909A Unspecified injury of unspecified muscle, fascia and tendon at shoulder and upper arm level, unspecified arm, initial encounter: Secondary | ICD-10-CM | POA: Insufficient documentation

## 2014-05-15 DIAGNOSIS — Y9389 Activity, other specified: Secondary | ICD-10-CM | POA: Insufficient documentation

## 2014-05-15 DIAGNOSIS — S40029A Contusion of unspecified upper arm, initial encounter: Secondary | ICD-10-CM | POA: Insufficient documentation

## 2014-05-15 DIAGNOSIS — Z791 Long term (current) use of non-steroidal anti-inflammatories (NSAID): Secondary | ICD-10-CM | POA: Insufficient documentation

## 2014-05-15 DIAGNOSIS — Y929 Unspecified place or not applicable: Secondary | ICD-10-CM | POA: Insufficient documentation

## 2014-05-15 HISTORY — DX: Bradycardia, unspecified: R00.1

## 2014-05-15 NOTE — Discharge Instructions (Signed)
The bruise will gradually resolve over the next 2-3 weeks. See, your doctor, or return here if needed for problems

## 2014-05-15 NOTE — ED Notes (Signed)
[facility]  Arm.  Received antibiotics IM and po Last checked on 8/5 Pt concerned because of discoloration of lt  Upper arm.  Pt says decreased strength of lt arm since 8/3  .
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
## 2015-01-21 ENCOUNTER — Other Ambulatory Visit (INDEPENDENT_AMBULATORY_CARE_PROVIDER_SITE_OTHER): Payer: Self-pay | Admitting: Otolaryngology

## 2015-01-21 DIAGNOSIS — H9193 Unspecified hearing loss, bilateral: Secondary | ICD-10-CM

## 2015-01-26 ENCOUNTER — Ambulatory Visit (HOSPITAL_COMMUNITY)
Admission: RE | Admit: 2015-01-26 | Discharge: 2015-01-26 | Disposition: A | Discharge status: Home / Self Care | Payer: Medicare Other | Source: Ambulatory Visit | Attending: Otolaryngology | Admitting: Otolaryngology

## 2015-01-26 DIAGNOSIS — R42 Dizziness and giddiness: Secondary | ICD-10-CM | POA: Insufficient documentation

## 2015-01-26 DIAGNOSIS — H9193 Unspecified hearing loss, bilateral: Secondary | ICD-10-CM

## 2015-01-26 DIAGNOSIS — H9201 Otalgia, right ear: Secondary | ICD-10-CM | POA: Diagnosis not present

## 2015-01-26 LAB — POCT I-STAT CREATININE: CREATININE: 1 mg/dL (ref 0.50–1.35)

## 2015-01-26 MED ORDER — IOHEXOL 300 MG/ML  SOLN
75.0000 mL | Freq: Once | INTRAMUSCULAR | Status: AC | PRN
Start: 2015-01-26 — End: 2015-01-26
  Administered 2015-01-26: 75 mL via INTRAVENOUS

## 2015-02-19 ENCOUNTER — Other Ambulatory Visit (INDEPENDENT_AMBULATORY_CARE_PROVIDER_SITE_OTHER): Payer: Self-pay | Admitting: Otolaryngology

## 2015-02-19 HISTORY — PX: MIDDLE EAR SURGERY: SHX713

## 2015-03-11 ENCOUNTER — Ambulatory Visit (INDEPENDENT_AMBULATORY_CARE_PROVIDER_SITE_OTHER): Payer: Medicare Other | Admitting: Otolaryngology

## 2015-04-01 ENCOUNTER — Ambulatory Visit (INDEPENDENT_AMBULATORY_CARE_PROVIDER_SITE_OTHER): Payer: Medicare Other | Admitting: Otolaryngology

## 2015-06-10 ENCOUNTER — Ambulatory Visit (INDEPENDENT_AMBULATORY_CARE_PROVIDER_SITE_OTHER): Payer: Medicare Other | Admitting: Otolaryngology

## 2015-06-10 DIAGNOSIS — H7011 Chronic mastoiditis, right ear: Secondary | ICD-10-CM

## 2015-06-24 ENCOUNTER — Ambulatory Visit (INDEPENDENT_AMBULATORY_CARE_PROVIDER_SITE_OTHER): Payer: Medicare Other | Admitting: Otolaryngology

## 2015-06-24 DIAGNOSIS — H7011 Chronic mastoiditis, right ear: Secondary | ICD-10-CM

## 2015-09-16 ENCOUNTER — Ambulatory Visit (INDEPENDENT_AMBULATORY_CARE_PROVIDER_SITE_OTHER): Payer: Medicare Other | Admitting: Otolaryngology

## 2015-09-16 DIAGNOSIS — H95121 Granulation of postmastoidectomy cavity, right ear: Secondary | ICD-10-CM | POA: Diagnosis not present

## 2015-09-30 ENCOUNTER — Ambulatory Visit (INDEPENDENT_AMBULATORY_CARE_PROVIDER_SITE_OTHER): Payer: Medicare Other | Admitting: Otolaryngology

## 2015-09-30 DIAGNOSIS — H95121 Granulation of postmastoidectomy cavity, right ear: Secondary | ICD-10-CM | POA: Diagnosis not present

## 2015-10-10 HISTORY — PX: UMBILICAL HERNIA REPAIR: SHX196

## 2015-10-27 DIAGNOSIS — H903 Sensorineural hearing loss, bilateral: Secondary | ICD-10-CM | POA: Diagnosis not present

## 2015-10-27 DIAGNOSIS — H95121 Granulation of postmastoidectomy cavity, right ear: Secondary | ICD-10-CM | POA: Diagnosis not present

## 2015-10-27 DIAGNOSIS — R42 Dizziness and giddiness: Secondary | ICD-10-CM | POA: Diagnosis not present

## 2015-11-29 DIAGNOSIS — M25512 Pain in left shoulder: Secondary | ICD-10-CM | POA: Diagnosis not present

## 2015-12-02 DIAGNOSIS — M25512 Pain in left shoulder: Secondary | ICD-10-CM | POA: Diagnosis not present

## 2015-12-02 DIAGNOSIS — S46112A Strain of muscle, fascia and tendon of long head of biceps, left arm, initial encounter: Secondary | ICD-10-CM | POA: Diagnosis not present

## 2015-12-16 ENCOUNTER — Ambulatory Visit (INDEPENDENT_AMBULATORY_CARE_PROVIDER_SITE_OTHER): Payer: Medicare Other | Admitting: Otolaryngology

## 2015-12-16 DIAGNOSIS — H95123 Granulation of postmastoidectomy cavity, bilateral ears: Secondary | ICD-10-CM

## 2015-12-16 DIAGNOSIS — R42 Dizziness and giddiness: Secondary | ICD-10-CM

## 2016-01-07 DIAGNOSIS — Z299 Encounter for prophylactic measures, unspecified: Secondary | ICD-10-CM | POA: Diagnosis not present

## 2016-01-07 DIAGNOSIS — R5383 Other fatigue: Secondary | ICD-10-CM | POA: Diagnosis not present

## 2016-01-07 DIAGNOSIS — Z0000 Encounter for general adult medical examination without abnormal findings: Secondary | ICD-10-CM | POA: Diagnosis not present

## 2016-01-07 DIAGNOSIS — Z7189 Other specified counseling: Secondary | ICD-10-CM | POA: Diagnosis not present

## 2016-01-07 DIAGNOSIS — Z1389 Encounter for screening for other disorder: Secondary | ICD-10-CM | POA: Diagnosis not present

## 2016-01-07 DIAGNOSIS — E7800 Pure hypercholesterolemia, unspecified: Secondary | ICD-10-CM | POA: Diagnosis not present

## 2016-01-07 DIAGNOSIS — Z79899 Other long term (current) drug therapy: Secondary | ICD-10-CM | POA: Diagnosis not present

## 2016-01-07 DIAGNOSIS — Z1211 Encounter for screening for malignant neoplasm of colon: Secondary | ICD-10-CM | POA: Diagnosis not present

## 2016-01-07 DIAGNOSIS — Z125 Encounter for screening for malignant neoplasm of prostate: Secondary | ICD-10-CM | POA: Diagnosis not present

## 2016-01-07 DIAGNOSIS — Z6825 Body mass index (BMI) 25.0-25.9, adult: Secondary | ICD-10-CM | POA: Diagnosis not present

## 2016-01-18 DIAGNOSIS — M7542 Impingement syndrome of left shoulder: Secondary | ICD-10-CM | POA: Diagnosis not present

## 2016-01-18 DIAGNOSIS — M25512 Pain in left shoulder: Secondary | ICD-10-CM | POA: Diagnosis not present

## 2016-01-18 DIAGNOSIS — S46112A Strain of muscle, fascia and tendon of long head of biceps, left arm, initial encounter: Secondary | ICD-10-CM | POA: Diagnosis not present

## 2016-03-08 DIAGNOSIS — M75102 Unspecified rotator cuff tear or rupture of left shoulder, not specified as traumatic: Secondary | ICD-10-CM | POA: Diagnosis not present

## 2016-03-14 DIAGNOSIS — S46112A Strain of muscle, fascia and tendon of long head of biceps, left arm, initial encounter: Secondary | ICD-10-CM | POA: Diagnosis not present

## 2016-03-14 DIAGNOSIS — M19012 Primary osteoarthritis, left shoulder: Secondary | ICD-10-CM | POA: Diagnosis not present

## 2016-03-14 DIAGNOSIS — M75122 Complete rotator cuff tear or rupture of left shoulder, not specified as traumatic: Secondary | ICD-10-CM | POA: Diagnosis not present

## 2016-03-14 DIAGNOSIS — M75102 Unspecified rotator cuff tear or rupture of left shoulder, not specified as traumatic: Secondary | ICD-10-CM | POA: Diagnosis not present

## 2016-03-16 ENCOUNTER — Ambulatory Visit (INDEPENDENT_AMBULATORY_CARE_PROVIDER_SITE_OTHER): Payer: Medicare Other | Admitting: Otolaryngology

## 2016-03-16 DIAGNOSIS — H9071 Mixed conductive and sensorineural hearing loss, unilateral, right ear, with unrestricted hearing on the contralateral side: Secondary | ICD-10-CM | POA: Diagnosis not present

## 2016-03-16 DIAGNOSIS — H95121 Granulation of postmastoidectomy cavity, right ear: Secondary | ICD-10-CM | POA: Diagnosis not present

## 2016-03-16 DIAGNOSIS — H7201 Central perforation of tympanic membrane, right ear: Secondary | ICD-10-CM

## 2016-03-16 DIAGNOSIS — M75102 Unspecified rotator cuff tear or rupture of left shoulder, not specified as traumatic: Secondary | ICD-10-CM | POA: Diagnosis not present

## 2016-03-16 DIAGNOSIS — S46112A Strain of muscle, fascia and tendon of long head of biceps, left arm, initial encounter: Secondary | ICD-10-CM | POA: Diagnosis not present

## 2016-03-16 DIAGNOSIS — M25512 Pain in left shoulder: Secondary | ICD-10-CM | POA: Diagnosis not present

## 2016-03-17 ENCOUNTER — Other Ambulatory Visit: Payer: Self-pay | Admitting: Otolaryngology

## 2016-03-31 DIAGNOSIS — S46212A Strain of muscle, fascia and tendon of other parts of biceps, left arm, initial encounter: Secondary | ICD-10-CM | POA: Diagnosis not present

## 2016-03-31 DIAGNOSIS — M7542 Impingement syndrome of left shoulder: Secondary | ICD-10-CM | POA: Diagnosis not present

## 2016-03-31 DIAGNOSIS — M19012 Primary osteoarthritis, left shoulder: Secondary | ICD-10-CM | POA: Diagnosis not present

## 2016-03-31 DIAGNOSIS — X58XXXA Exposure to other specified factors, initial encounter: Secondary | ICD-10-CM | POA: Diagnosis not present

## 2016-03-31 DIAGNOSIS — Z79899 Other long term (current) drug therapy: Secondary | ICD-10-CM | POA: Diagnosis not present

## 2016-03-31 DIAGNOSIS — Z87891 Personal history of nicotine dependence: Secondary | ICD-10-CM | POA: Diagnosis not present

## 2016-03-31 DIAGNOSIS — G8918 Other acute postprocedural pain: Secondary | ICD-10-CM | POA: Diagnosis not present

## 2016-03-31 DIAGNOSIS — M75122 Complete rotator cuff tear or rupture of left shoulder, not specified as traumatic: Secondary | ICD-10-CM | POA: Diagnosis not present

## 2016-03-31 DIAGNOSIS — Z809 Family history of malignant neoplasm, unspecified: Secondary | ICD-10-CM | POA: Diagnosis not present

## 2016-03-31 DIAGNOSIS — M75102 Unspecified rotator cuff tear or rupture of left shoulder, not specified as traumatic: Secondary | ICD-10-CM | POA: Diagnosis not present

## 2016-03-31 DIAGNOSIS — K219 Gastro-esophageal reflux disease without esophagitis: Secondary | ICD-10-CM | POA: Diagnosis not present

## 2016-03-31 HISTORY — PX: ROTATOR CUFF REPAIR: SHX139

## 2016-04-03 DIAGNOSIS — Z809 Family history of malignant neoplasm, unspecified: Secondary | ICD-10-CM | POA: Diagnosis not present

## 2016-04-03 DIAGNOSIS — M75102 Unspecified rotator cuff tear or rupture of left shoulder, not specified as traumatic: Secondary | ICD-10-CM | POA: Diagnosis not present

## 2016-04-03 DIAGNOSIS — M19012 Primary osteoarthritis, left shoulder: Secondary | ICD-10-CM | POA: Diagnosis not present

## 2016-04-03 DIAGNOSIS — S46212A Strain of muscle, fascia and tendon of other parts of biceps, left arm, initial encounter: Secondary | ICD-10-CM | POA: Diagnosis not present

## 2016-04-03 DIAGNOSIS — Z79899 Other long term (current) drug therapy: Secondary | ICD-10-CM | POA: Diagnosis not present

## 2016-04-03 DIAGNOSIS — M7542 Impingement syndrome of left shoulder: Secondary | ICD-10-CM | POA: Diagnosis not present

## 2016-04-10 DIAGNOSIS — M25512 Pain in left shoulder: Secondary | ICD-10-CM | POA: Diagnosis not present

## 2016-04-10 DIAGNOSIS — Z299 Encounter for prophylactic measures, unspecified: Secondary | ICD-10-CM | POA: Diagnosis not present

## 2016-04-10 DIAGNOSIS — Z9889 Other specified postprocedural states: Secondary | ICD-10-CM | POA: Diagnosis not present

## 2016-04-10 DIAGNOSIS — M75102 Unspecified rotator cuff tear or rupture of left shoulder, not specified as traumatic: Secondary | ICD-10-CM | POA: Diagnosis not present

## 2016-04-10 DIAGNOSIS — M810 Age-related osteoporosis without current pathological fracture: Secondary | ICD-10-CM | POA: Diagnosis not present

## 2016-04-14 DIAGNOSIS — Z9889 Other specified postprocedural states: Secondary | ICD-10-CM | POA: Diagnosis not present

## 2016-04-14 DIAGNOSIS — M25512 Pain in left shoulder: Secondary | ICD-10-CM | POA: Diagnosis not present

## 2016-04-17 DIAGNOSIS — Z9889 Other specified postprocedural states: Secondary | ICD-10-CM | POA: Diagnosis not present

## 2016-04-17 DIAGNOSIS — M25512 Pain in left shoulder: Secondary | ICD-10-CM | POA: Diagnosis not present

## 2016-04-18 ENCOUNTER — Encounter (HOSPITAL_BASED_OUTPATIENT_CLINIC_OR_DEPARTMENT_OTHER): Payer: Self-pay | Admitting: *Deleted

## 2016-04-19 DIAGNOSIS — Z9889 Other specified postprocedural states: Secondary | ICD-10-CM | POA: Diagnosis not present

## 2016-04-19 DIAGNOSIS — M25512 Pain in left shoulder: Secondary | ICD-10-CM | POA: Diagnosis not present

## 2016-04-21 DIAGNOSIS — M25512 Pain in left shoulder: Secondary | ICD-10-CM | POA: Diagnosis not present

## 2016-04-21 DIAGNOSIS — Z9889 Other specified postprocedural states: Secondary | ICD-10-CM | POA: Diagnosis not present

## 2016-04-24 DIAGNOSIS — Z9889 Other specified postprocedural states: Secondary | ICD-10-CM | POA: Diagnosis not present

## 2016-04-24 DIAGNOSIS — M25512 Pain in left shoulder: Secondary | ICD-10-CM | POA: Diagnosis not present

## 2016-04-26 DIAGNOSIS — M25512 Pain in left shoulder: Secondary | ICD-10-CM | POA: Diagnosis not present

## 2016-04-26 DIAGNOSIS — Z9889 Other specified postprocedural states: Secondary | ICD-10-CM | POA: Diagnosis not present

## 2016-04-28 DIAGNOSIS — Z9889 Other specified postprocedural states: Secondary | ICD-10-CM | POA: Diagnosis not present

## 2016-04-28 DIAGNOSIS — M25512 Pain in left shoulder: Secondary | ICD-10-CM | POA: Diagnosis not present

## 2016-05-01 DIAGNOSIS — M25512 Pain in left shoulder: Secondary | ICD-10-CM | POA: Diagnosis not present

## 2016-05-01 DIAGNOSIS — Z9889 Other specified postprocedural states: Secondary | ICD-10-CM | POA: Diagnosis not present

## 2016-05-02 ENCOUNTER — Encounter (HOSPITAL_BASED_OUTPATIENT_CLINIC_OR_DEPARTMENT_OTHER)
Admission: RE | Disposition: A | Discharge status: Home / Self Care | Payer: Self-pay | Source: Ambulatory Visit | Attending: Otolaryngology

## 2016-05-02 ENCOUNTER — Ambulatory Visit (HOSPITAL_BASED_OUTPATIENT_CLINIC_OR_DEPARTMENT_OTHER): Payer: Medicare Other | Admitting: Anesthesiology

## 2016-05-02 ENCOUNTER — Ambulatory Visit (HOSPITAL_BASED_OUTPATIENT_CLINIC_OR_DEPARTMENT_OTHER)
Admission: RE | Admit: 2016-05-02 | Discharge: 2016-05-02 | Disposition: A | Discharge status: Home / Self Care | Payer: Medicare Other | Source: Ambulatory Visit | Attending: Otolaryngology | Admitting: Otolaryngology

## 2016-05-02 ENCOUNTER — Encounter (HOSPITAL_BASED_OUTPATIENT_CLINIC_OR_DEPARTMENT_OTHER): Payer: Self-pay | Admitting: *Deleted

## 2016-05-02 DIAGNOSIS — Z87891 Personal history of nicotine dependence: Secondary | ICD-10-CM | POA: Insufficient documentation

## 2016-05-02 DIAGNOSIS — H9011 Conductive hearing loss, unilateral, right ear, with unrestricted hearing on the contralateral side: Secondary | ICD-10-CM | POA: Diagnosis not present

## 2016-05-02 DIAGNOSIS — K219 Gastro-esophageal reflux disease without esophagitis: Secondary | ICD-10-CM | POA: Insufficient documentation

## 2016-05-02 DIAGNOSIS — H908 Mixed conductive and sensorineural hearing loss, unspecified: Secondary | ICD-10-CM | POA: Diagnosis not present

## 2016-05-02 DIAGNOSIS — H7291 Unspecified perforation of tympanic membrane, right ear: Secondary | ICD-10-CM | POA: Insufficient documentation

## 2016-05-02 DIAGNOSIS — H7201 Central perforation of tympanic membrane, right ear: Secondary | ICD-10-CM | POA: Diagnosis not present

## 2016-05-02 HISTORY — DX: Gastro-esophageal reflux disease without esophagitis: K21.9

## 2016-05-02 HISTORY — DX: Unspecified osteoarthritis, unspecified site: M19.90

## 2016-05-02 HISTORY — PX: TYMPANOPLASTY: SHX33

## 2016-05-02 SURGERY — TYMPANOPLASTY
Anesthesia: General | Site: Ear | Laterality: Right

## 2016-05-02 MED ORDER — ONDANSETRON HCL 4 MG/2ML IJ SOLN
INTRAMUSCULAR | Status: DC | PRN
  Administered 2016-05-02: 4 mg via INTRAVENOUS

## 2016-05-02 MED ORDER — OXYCODONE HCL 5 MG/5ML PO SOLN: 5.0000 mg | Freq: Once | ORAL | Status: DC | PRN

## 2016-05-02 MED ORDER — SCOPOLAMINE 1 MG/3DAYS TD PT72: 1.0000 | MEDICATED_PATCH | Freq: Once | TRANSDERMAL | Status: DC | PRN

## 2016-05-02 MED ORDER — LIDOCAINE-EPINEPHRINE 1 %-1:100000 IJ SOLN
INTRAMUSCULAR | Status: AC
  Filled 2016-05-02: qty 1

## 2016-05-02 MED ORDER — CIPROFLOXACIN-FLUOCINOLONE PF 0.3-0.025 % OT SOLN
OTIC | Status: AC
  Filled 2016-05-02: qty 0.25

## 2016-05-02 MED ORDER — OXYCODONE HCL 5 MG PO TABS: 5.0000 mg | ORAL_TABLET | Freq: Once | ORAL | Status: DC | PRN

## 2016-05-02 MED ORDER — LIDOCAINE HCL (CARDIAC) 20 MG/ML IV SOLN
INTRAVENOUS | Status: DC | PRN
  Administered 2016-05-02: 100 mg via INTRAVENOUS

## 2016-05-02 MED ORDER — SUCCINYLCHOLINE CHLORIDE 200 MG/10ML IV SOSY
PREFILLED_SYRINGE | INTRAVENOUS | Status: AC
  Filled 2016-05-02: qty 10

## 2016-05-02 MED ORDER — MIDAZOLAM HCL 2 MG/2ML IJ SOLN
1.0000 mg | INTRAMUSCULAR | Status: DC | PRN
  Administered 2016-05-02: 2 mg via INTRAVENOUS

## 2016-05-02 MED ORDER — DEXAMETHASONE SODIUM PHOSPHATE 4 MG/ML IJ SOLN
INTRAMUSCULAR | Status: DC | PRN
  Administered 2016-05-02: 10 mg via INTRAVENOUS

## 2016-05-02 MED ORDER — HYDROMORPHONE HCL 1 MG/ML IJ SOLN
INTRAMUSCULAR | Status: AC
  Filled 2016-05-02: qty 1

## 2016-05-02 MED ORDER — GLYCOPYRROLATE 0.2 MG/ML IJ SOLN: 0.2000 mg | Freq: Once | INTRAMUSCULAR | Status: DC | PRN

## 2016-05-02 MED ORDER — ONDANSETRON HCL 4 MG/2ML IJ SOLN
INTRAMUSCULAR | Status: AC
  Filled 2016-05-02: qty 2

## 2016-05-02 MED ORDER — BACITRACIN ZINC 500 UNIT/GM EX OINT
TOPICAL_OINTMENT | CUTANEOUS | Status: AC
  Filled 2016-05-02: qty 0.9

## 2016-05-02 MED ORDER — PROPOFOL 10 MG/ML IV BOLUS
INTRAVENOUS | Status: DC | PRN
  Administered 2016-05-02: 200 mg via INTRAVENOUS

## 2016-05-02 MED ORDER — EPINEPHRINE HCL 1 MG/ML IJ SOLN
INTRAMUSCULAR | Status: DC | PRN
Start: 2016-05-02 — End: 2016-05-02
  Administered 2016-05-02: .3 mL

## 2016-05-02 MED ORDER — CIPROFLOXACIN-FLUOCINOLONE PF 0.3-0.025 % OT SOLN
OTIC | Status: DC | PRN
  Administered 2016-05-02: .5 mL via OTIC

## 2016-05-02 MED ORDER — OXYCODONE-ACETAMINOPHEN 5-325 MG PO TABS
1.0000 | ORAL_TABLET | ORAL | 0 refills | Status: DC | PRN
Start: 2016-05-02 — End: 2017-02-22

## 2016-05-02 MED ORDER — FENTANYL CITRATE (PF) 100 MCG/2ML IJ SOLN
INTRAMUSCULAR | Status: AC
  Filled 2016-05-02: qty 2

## 2016-05-02 MED ORDER — HYDROMORPHONE HCL 1 MG/ML IJ SOLN
0.2500 mg | INTRAMUSCULAR | Status: DC | PRN
  Administered 2016-05-02 (×2): 0.5 mg via INTRAVENOUS

## 2016-05-02 MED ORDER — LACTATED RINGERS IV SOLN
INTRAVENOUS | Status: DC
  Administered 2016-05-02: 10 mL/h via INTRAVENOUS

## 2016-05-02 MED ORDER — MIDAZOLAM HCL 2 MG/2ML IJ SOLN
INTRAMUSCULAR | Status: AC
  Filled 2016-05-02: qty 2

## 2016-05-02 MED ORDER — LIDOCAINE-EPINEPHRINE 1 %-1:100000 IJ SOLN
INTRAMUSCULAR | Status: DC | PRN
  Administered 2016-05-02: 2 mL

## 2016-05-02 MED ORDER — FENTANYL CITRATE (PF) 100 MCG/2ML IJ SOLN
50.0000 ug | INTRAMUSCULAR | Status: DC | PRN
  Administered 2016-05-02: 100 ug via INTRAVENOUS

## 2016-05-02 MED ORDER — MEPERIDINE HCL 25 MG/ML IJ SOLN: 6.2500 mg | INTRAMUSCULAR | Status: DC | PRN

## 2016-05-02 MED ORDER — LIDOCAINE 2% (20 MG/ML) 5 ML SYRINGE
INTRAMUSCULAR | Status: AC
  Filled 2016-05-02: qty 5

## 2016-05-02 MED ORDER — CEFAZOLIN SODIUM-DEXTROSE 2-3 GM-% IV SOLR
INTRAVENOUS | Status: DC | PRN
  Administered 2016-05-02: 2 g via INTRAVENOUS

## 2016-05-02 MED ORDER — BACITRACIN ZINC 500 UNIT/GM EX OINT
TOPICAL_OINTMENT | CUTANEOUS | Status: DC | PRN
  Administered 2016-05-02: 1 via TOPICAL

## 2016-05-02 MED ORDER — DEXAMETHASONE SODIUM PHOSPHATE 10 MG/ML IJ SOLN
INTRAMUSCULAR | Status: AC
Start: 2016-05-02 — End: 2016-05-02
  Filled 2016-05-02: qty 1

## 2016-05-02 MED ORDER — OXYMETAZOLINE HCL 0.05 % NA SOLN
NASAL | Status: AC
  Filled 2016-05-02: qty 15

## 2016-05-02 SURGICAL SUPPLY — 48 items
BALL CTTN LRG ABS STRL LF (GAUZE/BANDAGES/DRESSINGS) ×1
BLADE CLIPPER SURG (BLADE) IMPLANT
BLADE NEEDLE 3 SS STRL (BLADE) IMPLANT
BLADE NEEDLE 3MM SS STRL (BLADE)
CANISTER SUCT 1200ML W/VALVE (MISCELLANEOUS) ×3 IMPLANT
CORDS BIPOLAR (ELECTRODE) IMPLANT
COTTONBALL LRG STERILE PKG (GAUZE/BANDAGES/DRESSINGS) ×3 IMPLANT
DECANTER SPIKE VIAL GLASS SM (MISCELLANEOUS) IMPLANT
DEPRESSOR TONGUE BLADE STERILE (MISCELLANEOUS) ×3 IMPLANT
DRAPE MICROSCOPE WILD 40.5X102 (DRAPES) ×3 IMPLANT
DRAPE SURG 17X23 STRL (DRAPES) ×3 IMPLANT
DRSG GLASSCOCK MASTOID ADT (GAUZE/BANDAGES/DRESSINGS) IMPLANT
DRSG GLASSCOCK MASTOID PED (GAUZE/BANDAGES/DRESSINGS) IMPLANT
ELECT COATED BLADE 2.86 ST (ELECTRODE) ×3 IMPLANT
ELECT REM PT RETURN 9FT ADLT (ELECTROSURGICAL) ×3
ELECTRODE REM PT RTRN 9FT ADLT (ELECTROSURGICAL) ×1 IMPLANT
GAUZE SPONGE 4X4 12PLY STRL (GAUZE/BANDAGES/DRESSINGS) IMPLANT
GLOVE BIO SURGEON STRL SZ7.5 (GLOVE) ×3 IMPLANT
GLOVE EXAM NITRILE EXT CUFF MD (GLOVE) ×3 IMPLANT
GLOVE SURG SS PI 7.0 STRL IVOR (GLOVE) ×3 IMPLANT
GOWN STRL REUS W/ TWL LRG LVL3 (GOWN DISPOSABLE) ×2 IMPLANT
GOWN STRL REUS W/TWL LRG LVL3 (GOWN DISPOSABLE) ×6
IV CATH AUTO 14GX1.75 SAFE ORG (IV SOLUTION) ×3 IMPLANT
IV NS 500ML (IV SOLUTION)
IV NS 500ML BAXH (IV SOLUTION) IMPLANT
IV SET EXT 30 76VOL 4 MALE LL (IV SETS) ×3 IMPLANT
LIQUID BAND (GAUZE/BANDAGES/DRESSINGS) ×3 IMPLANT
NDL SAFETY ECLIPSE 18X1.5 (NEEDLE) ×1 IMPLANT
NEEDLE HYPO 18GX1.5 SHARP (NEEDLE) ×3
NEEDLE HYPO 25X1 1.5 SAFETY (NEEDLE) ×3 IMPLANT
NS IRRIG 1000ML POUR BTL (IV SOLUTION) ×3 IMPLANT
PACK BASIN DAY SURGERY FS (CUSTOM PROCEDURE TRAY) ×3 IMPLANT
PACK ENT DAY SURGERY (CUSTOM PROCEDURE TRAY) ×3 IMPLANT
PENCIL BUTTON HOLSTER BLD 10FT (ELECTRODE) ×3 IMPLANT
SLEEVE SCD COMPRESS KNEE MED (MISCELLANEOUS) ×3 IMPLANT
SPONGE GAUZE 4X4 12PLY STER LF (GAUZE/BANDAGES/DRESSINGS) IMPLANT
SPONGE SURGIFOAM ABS GEL 12-7 (HEMOSTASIS) ×3 IMPLANT
SUT VIC AB 3-0 SH 27 (SUTURE)
SUT VIC AB 3-0 SH 27X BRD (SUTURE) IMPLANT
SUT VIC AB 4-0 P-3 18XBRD (SUTURE) IMPLANT
SUT VIC AB 4-0 P3 18 (SUTURE)
SUT VICRYL 4-0 PS2 18IN ABS (SUTURE) ×3 IMPLANT
SYR 3ML 18GX1 1/2 (SYRINGE) ×3 IMPLANT
SYR 5ML LL (SYRINGE) ×2 IMPLANT
SYR BULB 3OZ (MISCELLANEOUS) IMPLANT
TOWEL OR 17X24 6PK STRL BLUE (TOWEL DISPOSABLE) ×3 IMPLANT
TRAY DSU PREP LF (CUSTOM PROCEDURE TRAY) ×3 IMPLANT
TUBING IRRIGATION (MISCELLANEOUS) IMPLANT

## 2016-05-02 NOTE — H&P (Signed)
Cc: Right ear hearing loss, ossicular erosion  HPI: The patient is a 65 year old male who returns today complaining of difficulty with his right ear hearing.  The patient has a history of right ear cholesteatoma, status post right canal wall down tympanomastoidectomy surgery in 02/2015.  His ossicles were noted to be eroded intraoperatively.  Since the surgery, he has been experiencing significant right ear conductive hearing loss.  The patient was last seen 3 months ago.  At that time, his right mastoid cavity was debrided.  No recurrent cholesteatoma was noted. The patient returns today complaining of clogging sensation in his right ear.  He is interested in possible revision surgery with reconstruction of his middle ear ossicles.  Currently he denies any significant otalgia or otorrhea.  No other ENT, GI, or respiratory issue noted since the last visit.   Exam General: Communicates without difficulty, well nourished, no acute distress. Head: Normocephalic, no evidence injury, no tenderness, facial buttresses intact without stepoff. Eyes: PERRL, EOMI. No scleral icterus, conjunctivae clear. Neuro: CN II exam reveals vision grossly intact. No nystagmus at any point of gaze. Ears: Auricles well formed without lesions. A moderate amount of debris is noted within the right mastoid cavity. There is no evidence of recurrent cholesteatoma or acute infection. Under the operating microscope, the cavity is debrided. Nose: External evaluation reveals normal support and skin without lesions. Dorsum is intact. Anterior rhinoscopy reveals healthy pink mucosa over anterior aspect of inferior turbinates and intact septum. No purulence noted. Oral:  Oral cavity and oropharynx are intact, symmetric, without erythema or edema. Mucosa is moist without lesions. Neck: Full range of motion without pain. There is no significant lymphadenopathy. No masses palpable. Thyroid bed within normal limits to palpation. Parotid glands and  submandibular glands equal bilaterally without mass. Trachea is midline. Neuro:  CN 2-12 grossly intact. Gait normal.   Procedure: Debridement of the right mastoid cavity.  Anesthesia: None.  Description: The patient is placed supine on the operating table. Under the operating microscope, the right ear is examined. A large mastoid bowl is noted. A moderate amount of squamous debris is noted within the mastoid bowl. Under the operating microscope, the squamous debris and granulation tissue are extensively and carefully removed with a combination of suction catheters, cerumen curette, and alligator forceps. After the debridement procedure, the mastoid bowl is noted to be healthy with no evidence of infection or cholesteatoma. The patient tolerated the procedure well.  AUDIOMETRIC TESTING:  Shows bilateral high-frequency sensorineural hearing loss, with conductive hearing loss noted on the right side. The speech reception threshold is 70dB AD and 30dB AS. The discrimination score is 72% AD and 84% AS.   Assessment 1.  A moderate amount of debris is noted within the right mastoid cavity.  No acute infection or recurrent cholesteatoma is noted today.  2.  Stable right ear mixed hearing loss, with significant conductive component.   Plan  1.  Otomicroscopy with debridement of his right mastoid cavity.  2.  The physical exam findings and the hearing test results are reviewed with the patient.  3.  The patient is a candidate to undergo revision tympanoplasty with ossicular chain reconstruction, in hopes of improving his right ear hearing.  The risks, benefits, alternatives and details of the procedure are reviewed with the patient.   4.  The patient would like to proceed with the procedure.

## 2016-05-02 NOTE — Anesthesia Postprocedure Evaluation (Signed)
Anesthesia Post Note  Patient: Kenneth [name]  Procedure(s) Performed: Procedure(s) (LRB): TYMPANOPLASTY (Right)  Patient location during evaluation: PACU Anesthesia Type: General Level of consciousness: awake and alert Pain management: pain level controlled Vital Signs Assessment: post-procedure vital signs reviewed and stable Respiratory status: spontaneous breathing, nonlabored ventilation and respiratory function stable Cardiovascular status: blood pressure returned to baseline and stable Postop Assessment: no signs of nausea or vomiting Anesthetic complications: no    Last Vitals:  Vitals:   05/02/16 1230 05/02/16 1245  BP: (!) 152/98 (!) 154/118  Pulse: 85 75  Resp: 11 13  Temp:      Last Pain:  Vitals:   05/02/16 1230  TempSrc:   PainSc: 4                  Karn Derk A

## 2016-05-02 NOTE — Transfer of Care (Signed)
Immediate Anesthesia Transfer of Care Note  Patient: Kenneth [name]  Procedure(s) Performed: Procedure(s): TYMPANOPLASTY OSSICULAR CHAIN RECONSTRUCTION RIGHT EAR (Right)  Patient Location: PACU  Anesthesia Type:General  Level of Consciousness: awake and alert   Airway & Oxygen Therapy: Patient Spontanous Breathing and Patient connected to face mask oxygen  Post-op Assessment: Report given to RN and Post -op Vital signs reviewed and stable  Post vital signs: Reviewed and stable  Last Vitals:  Vitals:   05/02/16 0836 05/02/16 1154  BP: (!) 144/103 (!) 137/94  Pulse:  79  Resp:    Temp:      Last Pain:  Vitals:   05/02/16 0826  TempSrc: Oral  PainSc:       Patients Stated Pain Goal: 2 ([id number])  Complications: No apparent anesthesia complications

## 2016-05-02 NOTE — Op Note (Signed)
DATE OF PROCEDURE: 05/02/2016  OPERATIVE REPORT   SURGEON: Leta Baptist, MD  PREOPERATIVE DIAGNOSIS:  1. Right tympanic membrane perforation. 2. Right ossicular erosion  POSTOPERATIVE DIAGNOSIS:  1. Right tympanic membrane perforation. 2. Right ossicular erosion  PROCEDURES PERFORMED: 1. Right transcanal tympanoplasty. 2. Right postauricular temporalis fascia graft.  ANESTHESIA: General laryngeal mask anesthesia.  COMPLICATIONS: None.  ESTIMATED BLOOD LOSS: Minimal.  INDICATION FOR PROCEDURE:  Kenneth [name] is a 65 y.o. male with a history of right ear cholesteatoma. He previously underwent right canal wall down tympanomastoidectomy surgery in May 2016. His ossicles were noted to be eroded intraoperatively. His malleus and incus were removed. He was subsequently noted to have a large right tympanic membrane perforation. He was also noted to have a significant conductive hearing loss component on the right side.  Based on the above findings, the decision was made for the patient to undergo the above-stated procedures. The risks, benefits, alternatives, and details of the procedures were discussed with the mother. Questions were invited and answered. Informed consent was obtained.  DESCRIPTION OF PROCEDURE: The patient was taken to the operating room and placed supine on the operating table. General laryngeal mask anesthesia was induced by the anesthesiologist. Under the operating microscope, the right mastoid bowl was cleaned of all cerumen. A 40% inferior neo-TM perforation was noted. A rim of fibrotic tissue was removed circumferentially from the perforation. A tympano-flap was elevated in the standard fashion. The stapes was noted to be in place. No recurrent cholesteatoma was noted.  Attention was then focused on obtaining the temporalis fascia graft. A separate postauricular incision was made. Incision was carried down to the level of the temporalis fascia. A 2  x 2 cm temporalis fascia graft was harvested in a standard fashion. Hemostasis was achieved with Bovie electrocautery. The surgical site was copiously irrigated. The incision was closed in layers with 4-0 Vicryl and Dermabond. A piece of tragal cartilage was then harvested in the standard fashion.  Under the operating microscope, the harvested graft was inserted via the ear canal beneath the neoTM. It was used to cover the TM perforation.The tragal cartilage was used to bridge the gap between the stapes and the new tympanum. Gelfoam was used to pack the middle ear and ear canal, sandwiching the neotympanum. Otovel ear drops were applied. Antibiotic ointment was applied to the ear canal. That concluded the procedure for the patient. The care of the patient was turned over to the anesthesiologist. The patient was awakened from anesthesia without difficulty. He was extubated and transferred to the recovery room in good condition.  OPERATIVE FINDINGS: A right TM perforation was noted. A piece of tragal cartilage was used to bridge the gap between the stapes and the new tympanum.  SPECIMEN: None.  FOLLOWUP CARE: The patient will be discharged home once he is awake and alert. He will follow up in my office in 1 week.

## 2016-05-02 NOTE — Anesthesia Procedure Notes (Signed)
Procedure Name: LMA Insertion Date/Time: 05/02/2016 10:30 AM Performed by: Lieutenant Diego Pre-anesthesia Checklist: Patient identified, Emergency Drugs available, Suction available and Patient being monitored Patient Re-evaluated:Patient Re-evaluated prior to inductionOxygen Delivery Method: Circle system utilized Preoxygenation: Pre-oxygenation with 100% oxygen Intubation Type: IV induction Ventilation: Mask ventilation without difficulty LMA: LMA inserted LMA Size: 5.0 Number of attempts: 1 Airway Equipment and Method: Bite block Placement Confirmation: positive ETCO2 Tube secured with: Tape Dental Injury: Teeth and Oropharynx as per pre-operative assessment

## 2016-05-02 NOTE — Anesthesia Preprocedure Evaluation (Signed)
Anesthesia Evaluation  Patient identified by MRN, date of birth, ID band Patient awake    Reviewed: Allergy & Precautions, NPO status , Patient's Chart, lab work & pertinent test results  Airway Mallampati: I  TM Distance: >3 FB Neck ROM: Full    Dental  (+) Teeth Intact, Dental Advisory Given   Pulmonary former smoker,    breath sounds clear to auscultation       Cardiovascular  Rhythm:Regular Rate:Normal     Neuro/Psych    GI/Hepatic GERD  ,  Endo/Other    Renal/GU      Musculoskeletal   Abdominal   Peds  Hematology   Anesthesia Other Findings   Reproductive/Obstetrics                             Anesthesia Physical Anesthesia Plan  ASA: I  Anesthesia Plan: General   Post-op Pain Management:    Induction: Intravenous  Airway Management Planned: LMA  Additional Equipment:   Intra-op Plan:   Post-operative Plan: Extubation in OR  Informed Consent: I have reviewed the patients History and Physical, chart, labs and discussed the procedure including the risks, benefits and alternatives for the proposed anesthesia with the patient or authorized representative who has indicated his/her understanding and acceptance.   Dental advisory given  Plan Discussed with: CRNA, Anesthesiologist and Surgeon  Anesthesia Plan Comments:         Anesthesia Quick Evaluation

## 2016-05-02 NOTE — Discharge Instructions (Addendum)
POSTOPERATIVE INSTRUCTIONS FOR PATIENTS HAVING A MYRINGOPLASTY AND TYMPANOPLASTY 1. Avoid undue fatigue or exposure to colds or upper respiratory infections if possible. 2. Do not blow your nose for approximately one week following surgery. Any accumulated secretions in the nose should be drawn back and expectorated through the mouth to avoid infecting the ear. If you sneeze, do so with your mouth open. Do not hold your nose to avoid sneezing. Do not play musical wind instruments for 3 weeks. 3. Wash your hands with soap and water before treating the ear. 4. A clean cloth moistened with warm water may be used to clean the outer ear as often as necessary for cleanliness and comfort. Do not allow water to enter the ear canal for at least three weeks. 5. You may shampoo your hair 48 hours following surgery, provided that water is not allowed to enter your ear canal. Water can be kept out of your ear canal by placing a cotton ball in the ear opening and applying Vaseline over the cotton to form a water tight seal. 6. If ear drops are to be instilled, position the head with the affected ear up during the instillation and remain in this position for five to ten minutes to facilitate the absorption of the drops. Then place a clean cotton ball in the ear for about an hour. 7. The ear should be exposed to the air as much as possible. A cotton ball should be placed in the ear canal during the day while combing the hair, during exposure to a dusty environment, and at night to prevent drainage onto your pillow. At first, the drainage may be red-brown to brown in color, but the brown drainage usually becomes clear and disappears within a week or two. If drainage increases, call our office, [phone]. 8. If your physician prescribes an antibiotic, fill the prescription promptly and take all of the medicine as directed until the entire supply is gone. 9. If any of the following should occur, contact your  physician: a. Persistent bleeding b. Persistent fever c. Purulent drainage (pus) from the ear or incision d. Increasing redness around the suture line e. Persistent pain or dizziness f. Facial weakness g. Rash around the ear or incision 10. Do not be overly concerned about your hearing until at least one month postoperatively. Your hearing may fluctuate as the ear heals. You may also experience some popping and cracking sounds in the ear for up to several weeks. It may sound like you are talking in a barrel or a tunnel. This is normal and should not cause concern. 11. You may notice a metallic taste in your mouth for several weeks after ear surgery. The taste will usually go away spontaneously. 12. Please ask your surgeon if any of the middle ear ossicles were replaced with metal parts. This may be important to know if you ever need to have a magnetic resonance imaging scan (MRI) in the future. 13. It is important for you to return for your scheduled appointments.      Post Anesthesia Home Care Instructions  Activity: Get plenty of rest for the remainder of the day. A responsible adult should stay with you for 24 hours following the procedure.  For the next 24 hours, DO NOT: -Drive a car -Paediatric nurse -Drink alcoholic beverages -Take any medication unless instructed by your physician -Make any legal decisions or sign important papers.  Meals: Start with liquid foods such as gelatin or soup. Progress to regular foods as  tolerated. Avoid greasy, spicy, heavy foods. If nausea and/or vomiting occur, drink only clear liquids until the nausea and/or vomiting subsides. Call your physician if vomiting continues.  Special Instructions/Symptoms: Your throat may feel dry or sore from the anesthesia or the breathing tube placed in your throat during surgery. If this causes discomfort, gargle with warm salt water. The discomfort should disappear within 24 hours.  If you had a scopolamine  patch placed behind your ear for the management of post- operative nausea and/or vomiting:  1. The medication in the patch is effective for 72 hours, after which it should be removed.  Wrap patch in a tissue and discard in the trash. Wash hands thoroughly with soap and water. 2. You may remove the patch earlier than 72 hours if you experience unpleasant side effects which may include dry mouth, dizziness or visual disturbances. 3. Avoid touching the patch. Wash your hands with soap and water after contact with the patch.

## 2016-05-03 DIAGNOSIS — I10 Essential (primary) hypertension: Secondary | ICD-10-CM | POA: Diagnosis not present

## 2016-05-03 DIAGNOSIS — E7800 Pure hypercholesterolemia, unspecified: Secondary | ICD-10-CM | POA: Diagnosis not present

## 2016-05-03 DIAGNOSIS — M810 Age-related osteoporosis without current pathological fracture: Secondary | ICD-10-CM | POA: Diagnosis not present

## 2016-05-05 ENCOUNTER — Encounter (HOSPITAL_BASED_OUTPATIENT_CLINIC_OR_DEPARTMENT_OTHER): Payer: Self-pay | Admitting: Otolaryngology

## 2016-05-09 DIAGNOSIS — M25512 Pain in left shoulder: Secondary | ICD-10-CM | POA: Diagnosis not present

## 2016-05-10 DIAGNOSIS — M25512 Pain in left shoulder: Secondary | ICD-10-CM | POA: Diagnosis not present

## 2016-05-11 ENCOUNTER — Ambulatory Visit (INDEPENDENT_AMBULATORY_CARE_PROVIDER_SITE_OTHER): Payer: Medicare Other | Admitting: Otolaryngology

## 2016-05-12 DIAGNOSIS — M25512 Pain in left shoulder: Secondary | ICD-10-CM | POA: Diagnosis not present

## 2016-05-15 DIAGNOSIS — M25512 Pain in left shoulder: Secondary | ICD-10-CM | POA: Diagnosis not present

## 2016-05-16 DIAGNOSIS — M818 Other osteoporosis without current pathological fracture: Secondary | ICD-10-CM | POA: Diagnosis not present

## 2016-05-16 DIAGNOSIS — J069 Acute upper respiratory infection, unspecified: Secondary | ICD-10-CM | POA: Diagnosis not present

## 2016-05-17 DIAGNOSIS — M25512 Pain in left shoulder: Secondary | ICD-10-CM | POA: Diagnosis not present

## 2016-05-19 DIAGNOSIS — M25512 Pain in left shoulder: Secondary | ICD-10-CM | POA: Diagnosis not present

## 2016-05-22 DIAGNOSIS — M25512 Pain in left shoulder: Secondary | ICD-10-CM | POA: Diagnosis not present

## 2016-05-24 DIAGNOSIS — M25512 Pain in left shoulder: Secondary | ICD-10-CM | POA: Diagnosis not present

## 2016-05-26 DIAGNOSIS — M25512 Pain in left shoulder: Secondary | ICD-10-CM | POA: Diagnosis not present

## 2016-05-29 DIAGNOSIS — M25512 Pain in left shoulder: Secondary | ICD-10-CM | POA: Diagnosis not present

## 2016-05-31 DIAGNOSIS — M25512 Pain in left shoulder: Secondary | ICD-10-CM | POA: Diagnosis not present

## 2016-06-02 DIAGNOSIS — M25512 Pain in left shoulder: Secondary | ICD-10-CM | POA: Diagnosis not present

## 2016-06-05 DIAGNOSIS — M25512 Pain in left shoulder: Secondary | ICD-10-CM | POA: Diagnosis not present

## 2016-06-07 DIAGNOSIS — M25512 Pain in left shoulder: Secondary | ICD-10-CM | POA: Diagnosis not present

## 2016-06-08 ENCOUNTER — Ambulatory Visit (INDEPENDENT_AMBULATORY_CARE_PROVIDER_SITE_OTHER): Payer: Medicare Other | Admitting: Otolaryngology

## 2016-06-08 DIAGNOSIS — J0101 Acute recurrent maxillary sinusitis: Secondary | ICD-10-CM | POA: Diagnosis not present

## 2016-06-09 DIAGNOSIS — I10 Essential (primary) hypertension: Secondary | ICD-10-CM | POA: Diagnosis not present

## 2016-06-09 DIAGNOSIS — J069 Acute upper respiratory infection, unspecified: Secondary | ICD-10-CM | POA: Diagnosis not present

## 2016-06-09 DIAGNOSIS — Z87891 Personal history of nicotine dependence: Secondary | ICD-10-CM | POA: Diagnosis not present

## 2016-06-09 DIAGNOSIS — M810 Age-related osteoporosis without current pathological fracture: Secondary | ICD-10-CM | POA: Diagnosis not present

## 2016-06-14 DIAGNOSIS — Z23 Encounter for immunization: Secondary | ICD-10-CM | POA: Diagnosis not present

## 2016-06-14 DIAGNOSIS — M25512 Pain in left shoulder: Secondary | ICD-10-CM | POA: Diagnosis not present

## 2016-06-16 DIAGNOSIS — M25512 Pain in left shoulder: Secondary | ICD-10-CM | POA: Diagnosis not present

## 2016-06-21 DIAGNOSIS — M25512 Pain in left shoulder: Secondary | ICD-10-CM | POA: Diagnosis not present

## 2016-06-22 DIAGNOSIS — M25512 Pain in left shoulder: Secondary | ICD-10-CM | POA: Diagnosis not present

## 2016-07-03 ENCOUNTER — Ambulatory Visit (INDEPENDENT_AMBULATORY_CARE_PROVIDER_SITE_OTHER): Payer: Medicare Other | Admitting: Otolaryngology

## 2016-07-03 DIAGNOSIS — J0101 Acute recurrent maxillary sinusitis: Secondary | ICD-10-CM

## 2016-07-03 DIAGNOSIS — J310 Chronic rhinitis: Secondary | ICD-10-CM

## 2016-08-14 ENCOUNTER — Ambulatory Visit (INDEPENDENT_AMBULATORY_CARE_PROVIDER_SITE_OTHER): Payer: Medicare Other | Admitting: Otolaryngology

## 2016-08-14 DIAGNOSIS — H7011 Chronic mastoiditis, right ear: Secondary | ICD-10-CM

## 2016-09-04 ENCOUNTER — Encounter (HOSPITAL_COMMUNITY): Payer: Self-pay | Admitting: Emergency Medicine

## 2016-09-04 ENCOUNTER — Emergency Department (HOSPITAL_COMMUNITY)
Admission: EM | Admit: 2016-09-04 | Discharge: 2016-09-05 | Disposition: A | Discharge status: Home / Self Care | Payer: Medicare Other | Attending: Emergency Medicine | Admitting: Emergency Medicine

## 2016-09-04 DIAGNOSIS — S8991XA Unspecified injury of right lower leg, initial encounter: Secondary | ICD-10-CM | POA: Diagnosis present

## 2016-09-04 DIAGNOSIS — S8011XA Contusion of right lower leg, initial encounter: Secondary | ICD-10-CM | POA: Insufficient documentation

## 2016-09-04 DIAGNOSIS — S9031XA Contusion of right foot, initial encounter: Secondary | ICD-10-CM | POA: Insufficient documentation

## 2016-09-04 DIAGNOSIS — Y999 Unspecified external cause status: Secondary | ICD-10-CM | POA: Insufficient documentation

## 2016-09-04 DIAGNOSIS — Y92410 Unspecified street and highway as the place of occurrence of the external cause: Secondary | ICD-10-CM | POA: Insufficient documentation

## 2016-09-04 DIAGNOSIS — Z87891 Personal history of nicotine dependence: Secondary | ICD-10-CM | POA: Diagnosis not present

## 2016-09-04 DIAGNOSIS — Y9355 Activity, bike riding: Secondary | ICD-10-CM | POA: Diagnosis not present

## 2016-09-04 DIAGNOSIS — M79671 Pain in right foot: Secondary | ICD-10-CM | POA: Diagnosis not present

## 2016-09-04 DIAGNOSIS — S99921A Unspecified injury of right foot, initial encounter: Secondary | ICD-10-CM | POA: Diagnosis not present
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
## 2016-09-05 ENCOUNTER — Emergency Department (HOSPITAL_COMMUNITY): Payer: Medicare Other

## 2016-09-05 DIAGNOSIS — S99921A Unspecified injury of right foot, initial encounter: Secondary | ICD-10-CM | POA: Diagnosis not present

## 2016-09-05 DIAGNOSIS — S8991XA Unspecified injury of right lower leg, initial encounter: Secondary | ICD-10-CM | POA: Diagnosis not present

## 2016-09-05 DIAGNOSIS — M79671 Pain in right foot: Secondary | ICD-10-CM | POA: Diagnosis not present

## 2016-09-05 DIAGNOSIS — S9031XA Contusion of right foot, initial encounter: Secondary | ICD-10-CM | POA: Diagnosis not present

## 2016-09-05 MED ORDER — NAPROXEN 500 MG PO TABS: 500.0000 mg | ORAL_TABLET | Freq: Two times a day (BID) | ORAL | 0 refills | Status: DC
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
## 2016-09-05 NOTE — Discharge Instructions (Signed)
Use ice and elevation for the next 2 days as much as comfortable to help with pain and ongoing swelling.  You may switch to using a heating pad 20 minutes several times daily starting on day 3.  Your xrays are negative for any bony injuries.

## 2016-09-25 DIAGNOSIS — M75122 Complete rotator cuff tear or rupture of left shoulder, not specified as traumatic: Secondary | ICD-10-CM | POA: Diagnosis not present

## 2016-10-09 DIAGNOSIS — I4891 Unspecified atrial fibrillation: Secondary | ICD-10-CM

## 2016-10-09 HISTORY — PX: CATARACT EXTRACTION W/ INTRAOCULAR LENS IMPLANT: SHX1309

## 2016-10-09 HISTORY — DX: Unspecified atrial fibrillation: I48.91

## 2016-10-13 DIAGNOSIS — K420 Umbilical hernia with obstruction, without gangrene: Secondary | ICD-10-CM | POA: Diagnosis not present

## 2016-10-13 DIAGNOSIS — Z299 Encounter for prophylactic measures, unspecified: Secondary | ICD-10-CM | POA: Diagnosis not present

## 2016-10-13 DIAGNOSIS — Z6826 Body mass index (BMI) 26.0-26.9, adult: Secondary | ICD-10-CM | POA: Diagnosis not present

## 2016-10-13 DIAGNOSIS — Z713 Dietary counseling and surveillance: Secondary | ICD-10-CM | POA: Diagnosis not present

## 2016-10-16 DIAGNOSIS — J069 Acute upper respiratory infection, unspecified: Secondary | ICD-10-CM | POA: Diagnosis not present

## 2016-10-16 DIAGNOSIS — K219 Gastro-esophageal reflux disease without esophagitis: Secondary | ICD-10-CM | POA: Diagnosis not present

## 2016-10-16 DIAGNOSIS — K429 Umbilical hernia without obstruction or gangrene: Secondary | ICD-10-CM | POA: Diagnosis not present

## 2016-10-16 DIAGNOSIS — I10 Essential (primary) hypertension: Secondary | ICD-10-CM | POA: Diagnosis not present

## 2016-10-16 DIAGNOSIS — Z6825 Body mass index (BMI) 25.0-25.9, adult: Secondary | ICD-10-CM | POA: Diagnosis not present

## 2016-11-03 DIAGNOSIS — Z79899 Other long term (current) drug therapy: Secondary | ICD-10-CM | POA: Diagnosis not present

## 2016-11-03 DIAGNOSIS — F419 Anxiety disorder, unspecified: Secondary | ICD-10-CM | POA: Diagnosis not present

## 2016-11-03 DIAGNOSIS — Z809 Family history of malignant neoplasm, unspecified: Secondary | ICD-10-CM | POA: Diagnosis not present

## 2016-11-03 DIAGNOSIS — K219 Gastro-esophageal reflux disease without esophagitis: Secondary | ICD-10-CM | POA: Diagnosis not present

## 2016-11-03 DIAGNOSIS — I10 Essential (primary) hypertension: Secondary | ICD-10-CM | POA: Diagnosis not present

## 2016-11-03 DIAGNOSIS — K429 Umbilical hernia without obstruction or gangrene: Secondary | ICD-10-CM | POA: Diagnosis not present

## 2016-11-03 DIAGNOSIS — Z87891 Personal history of nicotine dependence: Secondary | ICD-10-CM | POA: Diagnosis not present

## 2016-11-03 DIAGNOSIS — M810 Age-related osteoporosis without current pathological fracture: Secondary | ICD-10-CM | POA: Diagnosis not present

## 2016-11-03 DIAGNOSIS — K420 Umbilical hernia with obstruction, without gangrene: Secondary | ICD-10-CM | POA: Diagnosis not present

## 2016-11-13 ENCOUNTER — Ambulatory Visit (INDEPENDENT_AMBULATORY_CARE_PROVIDER_SITE_OTHER): Payer: Medicare Other | Admitting: Otolaryngology

## 2016-11-13 DIAGNOSIS — H7011 Chronic mastoiditis, right ear: Secondary | ICD-10-CM | POA: Diagnosis not present

## 2016-11-20 DIAGNOSIS — Z6825 Body mass index (BMI) 25.0-25.9, adult: Secondary | ICD-10-CM | POA: Diagnosis not present

## 2016-11-20 DIAGNOSIS — H539 Unspecified visual disturbance: Secondary | ICD-10-CM | POA: Diagnosis not present

## 2016-11-20 DIAGNOSIS — Z713 Dietary counseling and surveillance: Secondary | ICD-10-CM | POA: Diagnosis not present

## 2016-11-20 DIAGNOSIS — Z299 Encounter for prophylactic measures, unspecified: Secondary | ICD-10-CM | POA: Diagnosis not present

## 2017-01-01 DIAGNOSIS — H25013 Cortical age-related cataract, bilateral: Secondary | ICD-10-CM | POA: Diagnosis not present

## 2017-01-01 DIAGNOSIS — H2513 Age-related nuclear cataract, bilateral: Secondary | ICD-10-CM | POA: Diagnosis not present

## 2017-01-01 DIAGNOSIS — H43813 Vitreous degeneration, bilateral: Secondary | ICD-10-CM | POA: Diagnosis not present

## 2017-01-01 DIAGNOSIS — H35033 Hypertensive retinopathy, bilateral: Secondary | ICD-10-CM | POA: Diagnosis not present

## 2017-01-01 DIAGNOSIS — H2512 Age-related nuclear cataract, left eye: Secondary | ICD-10-CM | POA: Diagnosis not present

## 2017-01-15 DIAGNOSIS — K219 Gastro-esophageal reflux disease without esophagitis: Secondary | ICD-10-CM | POA: Diagnosis not present

## 2017-01-15 DIAGNOSIS — Z1211 Encounter for screening for malignant neoplasm of colon: Secondary | ICD-10-CM | POA: Diagnosis not present

## 2017-01-15 DIAGNOSIS — R5383 Other fatigue: Secondary | ICD-10-CM | POA: Diagnosis not present

## 2017-01-15 DIAGNOSIS — Z79899 Other long term (current) drug therapy: Secondary | ICD-10-CM | POA: Diagnosis not present

## 2017-01-15 DIAGNOSIS — N529 Male erectile dysfunction, unspecified: Secondary | ICD-10-CM | POA: Diagnosis not present

## 2017-01-15 DIAGNOSIS — Z299 Encounter for prophylactic measures, unspecified: Secondary | ICD-10-CM | POA: Diagnosis not present

## 2017-01-15 DIAGNOSIS — Z713 Dietary counseling and surveillance: Secondary | ICD-10-CM | POA: Diagnosis not present

## 2017-01-15 DIAGNOSIS — Z1389 Encounter for screening for other disorder: Secondary | ICD-10-CM | POA: Diagnosis not present

## 2017-01-15 DIAGNOSIS — E7800 Pure hypercholesterolemia, unspecified: Secondary | ICD-10-CM | POA: Diagnosis not present

## 2017-01-15 DIAGNOSIS — Z7189 Other specified counseling: Secondary | ICD-10-CM | POA: Diagnosis not present

## 2017-01-15 DIAGNOSIS — I10 Essential (primary) hypertension: Secondary | ICD-10-CM | POA: Diagnosis not present

## 2017-01-15 DIAGNOSIS — Z0000 Encounter for general adult medical examination without abnormal findings: Secondary | ICD-10-CM | POA: Diagnosis not present

## 2017-01-16 DIAGNOSIS — E7800 Pure hypercholesterolemia, unspecified: Secondary | ICD-10-CM | POA: Diagnosis not present

## 2017-01-16 DIAGNOSIS — Z125 Encounter for screening for malignant neoplasm of prostate: Secondary | ICD-10-CM | POA: Diagnosis not present

## 2017-01-16 DIAGNOSIS — R5383 Other fatigue: Secondary | ICD-10-CM | POA: Diagnosis not present

## 2017-01-16 DIAGNOSIS — Z79899 Other long term (current) drug therapy: Secondary | ICD-10-CM | POA: Diagnosis not present

## 2017-01-23 DIAGNOSIS — H2512 Age-related nuclear cataract, left eye: Secondary | ICD-10-CM | POA: Diagnosis not present

## 2017-01-23 DIAGNOSIS — H25812 Combined forms of age-related cataract, left eye: Secondary | ICD-10-CM | POA: Diagnosis not present

## 2017-01-24 DIAGNOSIS — I10 Essential (primary) hypertension: Secondary | ICD-10-CM | POA: Diagnosis not present

## 2017-01-24 DIAGNOSIS — I4891 Unspecified atrial fibrillation: Secondary | ICD-10-CM | POA: Diagnosis not present

## 2017-01-24 DIAGNOSIS — Z6826 Body mass index (BMI) 26.0-26.9, adult: Secondary | ICD-10-CM | POA: Diagnosis not present

## 2017-01-24 DIAGNOSIS — Z299 Encounter for prophylactic measures, unspecified: Secondary | ICD-10-CM | POA: Diagnosis not present

## 2017-01-24 DIAGNOSIS — E7800 Pure hypercholesterolemia, unspecified: Secondary | ICD-10-CM | POA: Diagnosis not present

## 2017-02-05 DIAGNOSIS — I480 Paroxysmal atrial fibrillation: Secondary | ICD-10-CM | POA: Diagnosis not present

## 2017-02-05 DIAGNOSIS — R0602 Shortness of breath: Secondary | ICD-10-CM | POA: Diagnosis not present

## 2017-02-12 DIAGNOSIS — H2511 Age-related nuclear cataract, right eye: Secondary | ICD-10-CM | POA: Diagnosis not present

## 2017-02-12 DIAGNOSIS — H25011 Cortical age-related cataract, right eye: Secondary | ICD-10-CM | POA: Diagnosis not present

## 2017-02-15 ENCOUNTER — Ambulatory Visit (INDEPENDENT_AMBULATORY_CARE_PROVIDER_SITE_OTHER): Payer: Medicare Other | Admitting: Otolaryngology

## 2017-02-15 DIAGNOSIS — H7011 Chronic mastoiditis, right ear: Secondary | ICD-10-CM | POA: Diagnosis not present

## 2017-02-20 DIAGNOSIS — H25811 Combined forms of age-related cataract, right eye: Secondary | ICD-10-CM | POA: Diagnosis not present

## 2017-02-20 DIAGNOSIS — H2511 Age-related nuclear cataract, right eye: Secondary | ICD-10-CM | POA: Diagnosis not present

## 2017-02-21 ENCOUNTER — Ambulatory Visit: Payer: Medicare Other | Admitting: Cardiology

## 2017-02-23 ENCOUNTER — Encounter: Payer: Self-pay | Admitting: Cardiology

## 2017-02-23 ENCOUNTER — Ambulatory Visit (INDEPENDENT_AMBULATORY_CARE_PROVIDER_SITE_OTHER): Payer: Medicare Other | Admitting: Cardiology

## 2017-02-23 VITALS — BP 146/70 | HR 86 | Ht 72.0 in | Wt 193.4 lb

## 2017-02-23 DIAGNOSIS — I38 Endocarditis, valve unspecified: Secondary | ICD-10-CM

## 2017-02-23 DIAGNOSIS — I4891 Unspecified atrial fibrillation: Secondary | ICD-10-CM | POA: Diagnosis not present

## 2017-02-23 DIAGNOSIS — R001 Bradycardia, unspecified: Secondary | ICD-10-CM

## 2017-02-23 NOTE — Patient Instructions (Signed)
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
## 2017-03-16 DIAGNOSIS — H579 Unspecified disorder of eye and adnexa: Secondary | ICD-10-CM | POA: Diagnosis not present

## 2017-03-16 DIAGNOSIS — H1820 Unspecified corneal edema: Secondary | ICD-10-CM | POA: Diagnosis not present

## 2017-03-16 DIAGNOSIS — H169 Unspecified keratitis: Secondary | ICD-10-CM | POA: Diagnosis not present

## 2017-03-19 DIAGNOSIS — H1820 Unspecified corneal edema: Secondary | ICD-10-CM | POA: Diagnosis not present

## 2017-03-19 DIAGNOSIS — H169 Unspecified keratitis: Secondary | ICD-10-CM | POA: Diagnosis not present

## 2017-03-19 DIAGNOSIS — H579 Unspecified disorder of eye and adnexa: Secondary | ICD-10-CM | POA: Diagnosis not present

## 2017-03-27 DIAGNOSIS — Z6825 Body mass index (BMI) 25.0-25.9, adult: Secondary | ICD-10-CM | POA: Diagnosis not present

## 2017-03-27 DIAGNOSIS — Z87891 Personal history of nicotine dependence: Secondary | ICD-10-CM | POA: Diagnosis not present

## 2017-03-27 DIAGNOSIS — J069 Acute upper respiratory infection, unspecified: Secondary | ICD-10-CM | POA: Diagnosis not present

## 2017-03-27 DIAGNOSIS — Z713 Dietary counseling and surveillance: Secondary | ICD-10-CM | POA: Diagnosis not present

## 2017-03-27 DIAGNOSIS — Z299 Encounter for prophylactic measures, unspecified: Secondary | ICD-10-CM | POA: Diagnosis not present

## 2017-04-03 DIAGNOSIS — J209 Acute bronchitis, unspecified: Secondary | ICD-10-CM | POA: Diagnosis not present

## 2017-04-03 DIAGNOSIS — K219 Gastro-esophageal reflux disease without esophagitis: Secondary | ICD-10-CM | POA: Diagnosis not present

## 2017-04-03 DIAGNOSIS — Z299 Encounter for prophylactic measures, unspecified: Secondary | ICD-10-CM | POA: Diagnosis not present

## 2017-04-03 DIAGNOSIS — R0602 Shortness of breath: Secondary | ICD-10-CM | POA: Diagnosis not present

## 2017-04-03 DIAGNOSIS — I4891 Unspecified atrial fibrillation: Secondary | ICD-10-CM | POA: Diagnosis not present

## 2017-04-03 DIAGNOSIS — R05 Cough: Secondary | ICD-10-CM | POA: Diagnosis not present

## 2017-04-03 DIAGNOSIS — R0989 Other specified symptoms and signs involving the circulatory and respiratory systems: Secondary | ICD-10-CM | POA: Diagnosis not present

## 2017-04-03 DIAGNOSIS — I517 Cardiomegaly: Secondary | ICD-10-CM | POA: Diagnosis not present

## 2017-04-03 DIAGNOSIS — Z6825 Body mass index (BMI) 25.0-25.9, adult: Secondary | ICD-10-CM | POA: Diagnosis not present

## 2017-04-03 DIAGNOSIS — J9811 Atelectasis: Secondary | ICD-10-CM | POA: Diagnosis not present

## 2017-04-12 DIAGNOSIS — J449 Chronic obstructive pulmonary disease, unspecified: Secondary | ICD-10-CM | POA: Diagnosis not present

## 2017-04-12 DIAGNOSIS — Z713 Dietary counseling and surveillance: Secondary | ICD-10-CM | POA: Diagnosis not present

## 2017-04-12 DIAGNOSIS — Z789 Other specified health status: Secondary | ICD-10-CM | POA: Diagnosis not present

## 2017-04-12 DIAGNOSIS — Z299 Encounter for prophylactic measures, unspecified: Secondary | ICD-10-CM | POA: Diagnosis not present

## 2017-04-12 DIAGNOSIS — Z6825 Body mass index (BMI) 25.0-25.9, adult: Secondary | ICD-10-CM | POA: Diagnosis not present

## 2017-04-12 DIAGNOSIS — R05 Cough: Secondary | ICD-10-CM | POA: Diagnosis not present

## 2017-04-16 DIAGNOSIS — R0989 Other specified symptoms and signs involving the circulatory and respiratory systems: Secondary | ICD-10-CM | POA: Diagnosis not present

## 2017-04-16 DIAGNOSIS — R062 Wheezing: Secondary | ICD-10-CM | POA: Diagnosis not present

## 2017-04-16 DIAGNOSIS — R05 Cough: Secondary | ICD-10-CM | POA: Diagnosis not present

## 2017-04-17 DIAGNOSIS — I10 Essential (primary) hypertension: Secondary | ICD-10-CM | POA: Diagnosis not present

## 2017-04-18 DIAGNOSIS — Z87891 Personal history of nicotine dependence: Secondary | ICD-10-CM | POA: Diagnosis not present

## 2017-04-18 DIAGNOSIS — I700 Atherosclerosis of aorta: Secondary | ICD-10-CM | POA: Diagnosis not present

## 2017-04-18 DIAGNOSIS — J439 Emphysema, unspecified: Secondary | ICD-10-CM | POA: Diagnosis not present

## 2017-04-19 DIAGNOSIS — Z299 Encounter for prophylactic measures, unspecified: Secondary | ICD-10-CM | POA: Diagnosis not present

## 2017-04-19 DIAGNOSIS — J449 Chronic obstructive pulmonary disease, unspecified: Secondary | ICD-10-CM | POA: Diagnosis not present

## 2017-04-19 DIAGNOSIS — I4891 Unspecified atrial fibrillation: Secondary | ICD-10-CM | POA: Diagnosis not present

## 2017-04-19 DIAGNOSIS — I10 Essential (primary) hypertension: Secondary | ICD-10-CM | POA: Diagnosis not present

## 2017-04-19 DIAGNOSIS — R252 Cramp and spasm: Secondary | ICD-10-CM | POA: Diagnosis not present

## 2017-04-19 DIAGNOSIS — Z6825 Body mass index (BMI) 25.0-25.9, adult: Secondary | ICD-10-CM | POA: Diagnosis not present

## 2017-05-07 DIAGNOSIS — M545 Low back pain: Secondary | ICD-10-CM | POA: Diagnosis not present

## 2017-05-07 DIAGNOSIS — N5089 Other specified disorders of the male genital organs: Secondary | ICD-10-CM | POA: Diagnosis not present

## 2017-05-07 DIAGNOSIS — I4891 Unspecified atrial fibrillation: Secondary | ICD-10-CM | POA: Diagnosis not present

## 2017-05-07 DIAGNOSIS — E7800 Pure hypercholesterolemia, unspecified: Secondary | ICD-10-CM | POA: Diagnosis not present

## 2017-05-07 DIAGNOSIS — Z6825 Body mass index (BMI) 25.0-25.9, adult: Secondary | ICD-10-CM | POA: Diagnosis not present

## 2017-05-07 DIAGNOSIS — I10 Essential (primary) hypertension: Secondary | ICD-10-CM | POA: Diagnosis not present

## 2017-05-07 DIAGNOSIS — K644 Residual hemorrhoidal skin tags: Secondary | ICD-10-CM | POA: Diagnosis not present

## 2017-05-07 DIAGNOSIS — J449 Chronic obstructive pulmonary disease, unspecified: Secondary | ICD-10-CM | POA: Diagnosis not present

## 2017-05-07 DIAGNOSIS — M810 Age-related osteoporosis without current pathological fracture: Secondary | ICD-10-CM | POA: Diagnosis not present

## 2017-05-07 DIAGNOSIS — Z299 Encounter for prophylactic measures, unspecified: Secondary | ICD-10-CM | POA: Diagnosis not present

## 2017-05-16 DIAGNOSIS — K642 Third degree hemorrhoids: Secondary | ICD-10-CM | POA: Diagnosis not present

## 2017-05-21 DIAGNOSIS — K642 Third degree hemorrhoids: Secondary | ICD-10-CM | POA: Diagnosis not present

## 2017-05-21 DIAGNOSIS — I4891 Unspecified atrial fibrillation: Secondary | ICD-10-CM | POA: Diagnosis not present

## 2017-05-21 DIAGNOSIS — J449 Chronic obstructive pulmonary disease, unspecified: Secondary | ICD-10-CM | POA: Diagnosis not present

## 2017-05-21 DIAGNOSIS — Z7902 Long term (current) use of antithrombotics/antiplatelets: Secondary | ICD-10-CM | POA: Diagnosis not present

## 2017-05-21 DIAGNOSIS — Z79899 Other long term (current) drug therapy: Secondary | ICD-10-CM | POA: Diagnosis not present

## 2017-05-21 DIAGNOSIS — F419 Anxiety disorder, unspecified: Secondary | ICD-10-CM | POA: Diagnosis not present

## 2017-05-21 DIAGNOSIS — I10 Essential (primary) hypertension: Secondary | ICD-10-CM | POA: Diagnosis not present

## 2017-05-21 DIAGNOSIS — K219 Gastro-esophageal reflux disease without esophagitis: Secondary | ICD-10-CM | POA: Diagnosis not present

## 2017-05-21 DIAGNOSIS — Z87891 Personal history of nicotine dependence: Secondary | ICD-10-CM | POA: Diagnosis not present

## 2017-05-21 DIAGNOSIS — Z809 Family history of malignant neoplasm, unspecified: Secondary | ICD-10-CM | POA: Diagnosis not present

## 2017-05-21 DIAGNOSIS — M810 Age-related osteoporosis without current pathological fracture: Secondary | ICD-10-CM | POA: Diagnosis not present

## 2017-05-22 DIAGNOSIS — I10 Essential (primary) hypertension: Secondary | ICD-10-CM | POA: Diagnosis not present

## 2017-05-22 DIAGNOSIS — K219 Gastro-esophageal reflux disease without esophagitis: Secondary | ICD-10-CM | POA: Diagnosis not present

## 2017-05-22 DIAGNOSIS — Z79899 Other long term (current) drug therapy: Secondary | ICD-10-CM | POA: Diagnosis not present

## 2017-05-22 DIAGNOSIS — Z7902 Long term (current) use of antithrombotics/antiplatelets: Secondary | ICD-10-CM | POA: Diagnosis not present

## 2017-05-22 DIAGNOSIS — K642 Third degree hemorrhoids: Secondary | ICD-10-CM | POA: Diagnosis not present

## 2017-05-22 DIAGNOSIS — F419 Anxiety disorder, unspecified: Secondary | ICD-10-CM | POA: Diagnosis not present

## 2017-05-22 DIAGNOSIS — K649 Unspecified hemorrhoids: Secondary | ICD-10-CM | POA: Diagnosis not present

## 2017-06-06 DIAGNOSIS — I4891 Unspecified atrial fibrillation: Secondary | ICD-10-CM | POA: Diagnosis not present

## 2017-06-06 DIAGNOSIS — K625 Hemorrhage of anus and rectum: Secondary | ICD-10-CM | POA: Diagnosis not present

## 2017-06-06 DIAGNOSIS — K649 Unspecified hemorrhoids: Secondary | ICD-10-CM | POA: Diagnosis not present

## 2017-06-06 DIAGNOSIS — T45515A Adverse effect of anticoagulants, initial encounter: Secondary | ICD-10-CM | POA: Diagnosis not present

## 2017-06-06 DIAGNOSIS — Z9889 Other specified postprocedural states: Secondary | ICD-10-CM | POA: Diagnosis not present

## 2017-06-06 DIAGNOSIS — D6832 Hemorrhagic disorder due to extrinsic circulating anticoagulants: Secondary | ICD-10-CM | POA: Diagnosis not present

## 2017-06-06 DIAGNOSIS — D62 Acute posthemorrhagic anemia: Secondary | ICD-10-CM | POA: Diagnosis not present

## 2017-06-07 DIAGNOSIS — I4891 Unspecified atrial fibrillation: Secondary | ICD-10-CM | POA: Diagnosis present

## 2017-06-07 DIAGNOSIS — K219 Gastro-esophageal reflux disease without esophagitis: Secondary | ICD-10-CM | POA: Diagnosis present

## 2017-06-07 DIAGNOSIS — K649 Unspecified hemorrhoids: Secondary | ICD-10-CM | POA: Diagnosis present

## 2017-06-07 DIAGNOSIS — Z79899 Other long term (current) drug therapy: Secondary | ICD-10-CM | POA: Diagnosis not present

## 2017-06-07 DIAGNOSIS — D62 Acute posthemorrhagic anemia: Secondary | ICD-10-CM | POA: Diagnosis present

## 2017-06-07 DIAGNOSIS — T45515A Adverse effect of anticoagulants, initial encounter: Secondary | ICD-10-CM | POA: Diagnosis present

## 2017-06-07 DIAGNOSIS — M810 Age-related osteoporosis without current pathological fracture: Secondary | ICD-10-CM | POA: Diagnosis present

## 2017-06-07 DIAGNOSIS — I10 Essential (primary) hypertension: Secondary | ICD-10-CM | POA: Diagnosis present

## 2017-06-07 DIAGNOSIS — Z87891 Personal history of nicotine dependence: Secondary | ICD-10-CM | POA: Diagnosis not present

## 2017-06-07 DIAGNOSIS — K625 Hemorrhage of anus and rectum: Secondary | ICD-10-CM | POA: Diagnosis not present

## 2017-06-07 DIAGNOSIS — Z808 Family history of malignant neoplasm of other organs or systems: Secondary | ICD-10-CM | POA: Diagnosis not present

## 2017-06-07 DIAGNOSIS — Z9889 Other specified postprocedural states: Secondary | ICD-10-CM | POA: Diagnosis not present

## 2017-06-07 DIAGNOSIS — E7800 Pure hypercholesterolemia, unspecified: Secondary | ICD-10-CM | POA: Diagnosis present

## 2017-06-07 DIAGNOSIS — J449 Chronic obstructive pulmonary disease, unspecified: Secondary | ICD-10-CM | POA: Diagnosis present

## 2017-06-07 DIAGNOSIS — D6832 Hemorrhagic disorder due to extrinsic circulating anticoagulants: Secondary | ICD-10-CM | POA: Diagnosis present

## 2017-06-14 ENCOUNTER — Ambulatory Visit (INDEPENDENT_AMBULATORY_CARE_PROVIDER_SITE_OTHER): Payer: Medicare Other | Admitting: Otolaryngology

## 2017-06-14 DIAGNOSIS — H7011 Chronic mastoiditis, right ear: Secondary | ICD-10-CM

## 2017-06-15 DIAGNOSIS — Z6825 Body mass index (BMI) 25.0-25.9, adult: Secondary | ICD-10-CM | POA: Diagnosis not present

## 2017-06-15 DIAGNOSIS — E7800 Pure hypercholesterolemia, unspecified: Secondary | ICD-10-CM | POA: Diagnosis not present

## 2017-06-15 DIAGNOSIS — I10 Essential (primary) hypertension: Secondary | ICD-10-CM | POA: Diagnosis not present

## 2017-06-15 DIAGNOSIS — I4891 Unspecified atrial fibrillation: Secondary | ICD-10-CM | POA: Diagnosis not present

## 2017-06-15 DIAGNOSIS — Z299 Encounter for prophylactic measures, unspecified: Secondary | ICD-10-CM | POA: Diagnosis not present

## 2017-06-15 DIAGNOSIS — K922 Gastrointestinal hemorrhage, unspecified: Secondary | ICD-10-CM | POA: Diagnosis not present

## 2017-06-15 DIAGNOSIS — J449 Chronic obstructive pulmonary disease, unspecified: Secondary | ICD-10-CM | POA: Diagnosis not present

## 2017-06-15 DIAGNOSIS — M810 Age-related osteoporosis without current pathological fracture: Secondary | ICD-10-CM | POA: Diagnosis not present

## 2017-06-26 ENCOUNTER — Encounter: Payer: Self-pay | Admitting: *Deleted

## 2017-06-26 ENCOUNTER — Ambulatory Visit (INDEPENDENT_AMBULATORY_CARE_PROVIDER_SITE_OTHER): Payer: Medicare Other | Admitting: Cardiology

## 2017-06-26 VITALS — BP 156/78 | HR 82 | Ht 72.0 in | Wt 192.0 lb

## 2017-06-26 DIAGNOSIS — R001 Bradycardia, unspecified: Secondary | ICD-10-CM | POA: Diagnosis not present

## 2017-06-26 DIAGNOSIS — I38 Endocarditis, valve unspecified: Secondary | ICD-10-CM | POA: Diagnosis not present

## 2017-06-26 DIAGNOSIS — I4891 Unspecified atrial fibrillation: Secondary | ICD-10-CM | POA: Diagnosis not present
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
## 2017-06-26 NOTE — Patient Instructions (Addendum)
Your physician wants you to follow-up in: [facility] will receive a reminder letter in the mail two months in advance. If you don't receive a letter, please call our office to schedule the follow-up appointment.  Your physician recommends that you continue on your current medications as directed. Please refer to the Current Medication list given to you today.  RE START ELIQUIS 5MG  TWICE DAILY  Thank you for choosing [facility]!!

## 2017-07-18 ENCOUNTER — Ambulatory Visit (HOSPITAL_COMMUNITY)
Admission: RE | Admit: 2017-07-18 | Discharge: 2017-07-18 | Disposition: A | Discharge status: Home / Self Care | Payer: Medicare Other | Source: Ambulatory Visit | Attending: Urology | Admitting: Urology

## 2017-07-18 ENCOUNTER — Ambulatory Visit (INDEPENDENT_AMBULATORY_CARE_PROVIDER_SITE_OTHER): Payer: Medicare Other | Admitting: Urology

## 2017-07-18 ENCOUNTER — Other Ambulatory Visit: Payer: Self-pay | Admitting: Urology

## 2017-07-18 DIAGNOSIS — N433 Hydrocele, unspecified: Secondary | ICD-10-CM | POA: Insufficient documentation

## 2017-07-18 DIAGNOSIS — N503 Cyst of epididymis: Secondary | ICD-10-CM | POA: Diagnosis not present

## 2017-07-26 ENCOUNTER — Other Ambulatory Visit: Payer: Self-pay | Admitting: Urology

## 2017-07-27 DIAGNOSIS — I4891 Unspecified atrial fibrillation: Secondary | ICD-10-CM | POA: Diagnosis not present

## 2017-07-27 DIAGNOSIS — J449 Chronic obstructive pulmonary disease, unspecified: Secondary | ICD-10-CM | POA: Diagnosis not present

## 2017-07-27 DIAGNOSIS — Z299 Encounter for prophylactic measures, unspecified: Secondary | ICD-10-CM | POA: Diagnosis not present

## 2017-07-27 DIAGNOSIS — Z6826 Body mass index (BMI) 26.0-26.9, adult: Secondary | ICD-10-CM | POA: Diagnosis not present

## 2017-07-27 DIAGNOSIS — Z713 Dietary counseling and surveillance: Secondary | ICD-10-CM | POA: Diagnosis not present

## 2017-08-06 NOTE — Patient Instructions (Signed)
Kenneth [name]  08/06/2017     @PREFPERIOPPHARMACY @   Your procedure is scheduled on  08/13/2017 .  Report to [facility] at  700  A.M.  Call this number if you have problems the morning of surgery:  [phone]   Remember:  Do not eat food or drink liquids after midnight.  Take these medicines the morning of surgery with A SIP OF WATER  prilosec.   Do not wear jewelry, make-up or nail polish.  Do not wear lotions, powders, or perfumes, or deoderant.  Do not shave 48 hours prior to surgery.  Men may shave face and neck.  Do not bring valuables to the hospital.  [facility] is not responsible for any belongings or valuables.  Contacts, dentures or bridgework may not be worn into surgery.  Leave your suitcase in the car.  After surgery it may be brought to your room.  For patients admitted to the hospital, discharge time will be determined by your treatment team.  Patients discharged the day of surgery will not be allowed to drive home.   Name and phone number of your driver:   family Special instructions:  None  Please read over the following fact sheets that you were given. Anesthesia Post-op Instructions and Care and Recovery After Surgery       Hydrocelectomy, Adult A hydrocelectomy is a surgical procedure to remove a collection of fluid (hydrocele) from the pouch that holds the testicles (scrotum). You may need to have a hydrocelectomy if a hydrocele is making your scrotum swell painfully. Tell a health care provider about:  Any allergies you have.  All medicines you are taking, including vitamins, herbs, eye drops, creams, and over-the-counter medicines.  Any problems you or family members have had with anesthetic medicines.  Any blood disorders you have.  Any surgeries you have had.  Any medical conditions you have. What are the risks? Generally this is a safe procedure. However, problems may occur, including:  Bleeding into the scrotum  (scrotal hematoma).  Damage to the testicle or the tube that carries sperm out of the testicle (vas deferens).  Infection.  Allergic reactions to medicines.  What happens before the procedure? Staying hydrated Follow instructions from your health care provider about hydration, which may include:  Up to 2 hours before the procedure - you may continue to drink clear liquids, such as water, clear fruit juice, black coffee, and plain tea.  Eating and drinking restrictions Follow instructions from your health care provider about eating and drinking, which may include:  8 hours before the procedure - stop eating heavy meals or foods such as meat, fried foods, or fatty foods.  6 hours before the procedure - stop eating light meals or foods, such as toast or cereal.  6 hours before the procedure - stop drinking milk or drinks that contain milk.  2 hours before the procedure - stop drinking clear liquids.  Medicines  Ask your health care provider about: ? Changing or stopping your regular medicines. This is especially important if you are taking diabetes medicines or blood thinners. ? Taking medicines such as aspirin and ibuprofen. These medicines can thin your blood. Do not take these medicines before your procedure if your health care provider instructs you not to.  You may be given antibiotic medicine to help prevent infection. General instructions  Plan to have someone take you home after the procedure. What happens during the procedure?  To reduce your risk of infection: ? Your health care team will wash or sanitize their hands. ? A germ-killing solution (antiseptic) will be used to wash your scrotum and the area around it. Hair may be removed from this area.  An IV tube will be inserted into one of your veins.  You will be given one or more of the following: ? A medicine to make you relax (sedative). ? A medicine to make you fall asleep (general anesthetic).  A small  incision will be made through the skin of your scrotum.  Your testicle and the hydrocele will be located, and the hydrocele sac will be opened with an incision.  The fluid will be drained from the hydrocele.  The hydrocele will be closed with absorbable stitches (sutures) to prevent fluid from building up again.  If you have a large hydrocele, a thin rubber drain may be placed to allow fluid to drain after the procedure.  The incision in your scrotum will be closed with absorbable sutures.  A bandage (dressing) will be placed over the incision. The procedure may vary among health care providers and hospitals. What happens after the procedure?  Your blood pressure, heart rate, breathing rate, and blood oxygen level will be monitored until the medicines you were given have worn off.  You will be given pain medicine as needed.  Do not drive for 24 hours if you were given a sedative.  You may have to wear an athletic support strap to hold the dressing in place and support your scrotum. This information is not intended to replace advice given to you by your health care provider. Make sure you discuss any questions you have with your health care provider. Document Released: 06/16/2015 Document Revised: 06/24/2016 Document Reviewed: 06/24/2016 Elsevier Interactive Patient Education  2018 [place], Adult, Care After This sheet gives you information about how to care for yourself after your procedure. Your health care provider may also give you more specific instructions. If you have problems or questions, contact your health care provider. What can I expect after the procedure? After your procedure, it is common to have mild discomfort, swelling, and bruising in the pouch that holds your testicles (scrotum). Follow these instructions at home: Bathing  Ask your health care provider when you can shower, take baths, or go swimming.  If you were told to wear an athletic  support strap, take it off when you shower or take a bath. Incision care  Follow instructions from your health care provider about how to take care of your incision. Make sure you: ? Wash your hands with soap and water before you change your bandage (dressing). If soap and water are not available, use hand sanitizer. ? Change your dressing as told by your health care provider. ? Leave stitches (sutures) in place.  Check your incision and scrotum every day for signs of infection. Check for: ? More redness, swelling, or pain. ? Blood or fluid. ? Warmth. ? Pus or a bad smell. Managing pain, stiffness, and swelling  If directed, apply ice to the injured area: ? Put ice in a plastic bag. ? Place a towel between your skin and the bag. ? Leave the ice on for 20 minutes, 2-3 times per day. Driving  Do not drive for 24 hours if you were given a sedative.  Do not drive or use heavy machinery while taking prescription pain medicine.  Ask your health care provider when it is safe to drive.  Activity  Do not do any activities that require great strength and energy (are vigorous) for as long as told by your health care provider.  Return to your normal activities as told by your health care provider. Ask your health care provider what activities are safe for you.  Do not lift anything that is heavier than 10 lb (4.5 kg) until your health care provider says that it is safe. General instructions  Take over-the-counter and prescription medicines only as told by your health care provider.  Keep all follow-up visits as told by your health care provider. This is important.  If you were given an athletic support strap, wear it as told by your health care provider.  If you had a drain put in during the procedure, you will need to return for a follow-up visit to have it removed. Contact a health care provider if:  Your pain is not controlled with medicine.  You have more redness or swelling  around your scrotum.  You have blood or fluid coming from your scrotum.  Your incision feels warm to the touch.  You have pus or a bad smell coming from your scrotum.  You have a fever. This information is not intended to replace advice given to you by your health care provider. Make sure you discuss any questions you have with your health care provider. Document Released: 06/16/2015 Document Revised: 06/24/2016 Document Reviewed: 06/24/2016 Elsevier Interactive Patient Education  2018 [place] Anesthesia, Adult General anesthesia is the use of medicines to make a person "go to sleep" (be unconscious) for a medical procedure. General anesthesia is often recommended when a procedure:  Is long.  Requires you to be still or in an unusual position.  Is major and can cause you to lose blood.  Is impossible to do without general anesthesia.  The medicines used for general anesthesia are called general anesthetics. In addition to making you sleep, the medicines:  Prevent pain.  Control your blood pressure.  Relax your muscles.  Tell a health care provider about:  Any allergies you have.  All medicines you are taking, including vitamins, herbs, eye drops, creams, and over-the-counter medicines.  Any problems you or family members have had with anesthetic medicines.  Types of anesthetics you have had in the past.  Any bleeding disorders you have.  Any surgeries you have had.  Any medical conditions you have.  Any history of heart or lung conditions, such as heart failure, sleep apnea, or chronic obstructive pulmonary disease (COPD).  Whether you are pregnant or may be pregnant.  Whether you use tobacco, alcohol, marijuana, or street drugs.  Any history of Armed forces logistics/support/administrative officer.  Any history of depression or anxiety. What are the risks? Generally, this is a safe procedure. However, problems may occur, including:  Allergic reaction to anesthetics.  Lung  and heart problems.  Inhaling food or liquids from your stomach into your lungs (aspiration).  Injury to nerves.  Waking up during your procedure and being unable to move (rare).  Extreme agitation or a state of mental confusion (delirium) when you wake up from the anesthetic.  Air in the bloodstream, which can lead to stroke.  These problems are more likely to develop if you are having a major surgery or if you have an advanced medical condition. You can prevent some of these complications by answering all of your health care provider's questions thoroughly and by following all pre-procedure instructions. General anesthesia can cause side effects, including:  Nausea or vomiting  A sore throat from the breathing tube.  Feeling cold or shivery.  Feeling tired, washed out, or achy.  Sleepiness or drowsiness.  Confusion or agitation.  What happens before the procedure? Staying hydrated Follow instructions from your health care provider about hydration, which may include:  Up to 2 hours before the procedure - you may continue to drink clear liquids, such as water, clear fruit juice, black coffee, and plain tea.  Eating and drinking restrictions Follow instructions from your health care provider about eating and drinking, which may include:  8 hours before the procedure - stop eating heavy meals or foods such as meat, fried foods, or fatty foods.  6 hours before the procedure - stop eating light meals or foods, such as toast or cereal.  6 hours before the procedure - stop drinking milk or drinks that contain milk.  2 hours before the procedure - stop drinking clear liquids.  Medicines  Ask your health care provider about: ? Changing or stopping your regular medicines. This is especially important if you are taking diabetes medicines or blood thinners. ? Taking medicines such as aspirin and ibuprofen. These medicines can thin your blood. Do not take these medicines before  your procedure if your health care provider instructs you not to. ? Taking new dietary supplements or medicines. Do not take these during the week before your procedure unless your health care provider approves them.  If you are told to take a medicine or to continue taking a medicine on the day of the procedure, take the medicine with sips of water. General instructions   Ask if you will be going home the same day, the following day, or after a longer hospital stay. ? Plan to have someone take you home. ? Plan to have someone stay with you for the first 24 hours after you leave the hospital or clinic.  For 3-6 weeks before the procedure, try not to use any tobacco products, such as cigarettes, chewing tobacco, and e-cigarettes.  You may brush your teeth on the morning of the procedure, but make sure to spit out the toothpaste. What happens during the procedure?  You will be given anesthetics through a mask and through an IV tube in one of your veins.  You may receive medicine to help you relax (sedative).  As soon as you are asleep, a breathing tube may be used to help you breathe.  An anesthesia specialist will stay with you throughout the procedure. He or she will help keep you comfortable and safe by continuing to give you medicines and adjusting the amount of medicine that you get. He or she will also watch your blood pressure, pulse, and oxygen levels to make sure that the anesthetics do not cause any problems.  If a breathing tube was used to help you breathe, it will be removed before you wake up. The procedure may vary among health care providers and hospitals. What happens after the procedure?  You will wake up, often slowly, after the procedure is complete, usually in a recovery area.  Your blood pressure, heart rate, breathing rate, and blood oxygen level will be monitored until the medicines you were given have worn off.  You may be given medicine to help you calm down if  you feel anxious or agitated.  If you will be going home the same day, your health care provider may check to make sure you can stand, drink, and urinate.  Your health care  providers will treat your pain and side effects before you go home.  Do not drive for 24 hours if you received a sedative.  You may: ? Feel nauseous and vomit. ? Have a sore throat. ? Have mental slowness. ? Feel cold or shivery. ? Feel sleepy. ? Feel tired. ? Feel sore or achy, even in parts of your body where you did not have surgery. This information is not intended to replace advice given to you by your health care provider. Make sure you discuss any questions you have with your health care provider. Document Released: 01/02/2008 Document Revised: 03/07/2016 Document Reviewed: 09/09/2015 Elsevier Interactive Patient Education  2018 [place] Anesthesia, Adult, Care After These instructions provide you with information about caring for yourself after your procedure. Your health care provider may also give you more specific instructions. Your treatment has been planned according to current medical practices, but problems sometimes occur. Call your health care provider if you have any problems or questions after your procedure. What can I expect after the procedure? After the procedure, it is common to have:  Vomiting.  A sore throat.  Mental slowness.  It is common to feel:  Nauseous.  Cold or shivery.  Sleepy.  Tired.  Sore or achy, even in parts of your body where you did not have surgery.  Follow these instructions at home: For at least 24 hours after the procedure:  Do not: ? Participate in activities where you could fall or become injured. ? Drive. ? Use heavy machinery. ? Drink alcohol. ? Take sleeping pills or medicines that cause drowsiness. ? Make important decisions or sign legal documents. ? Take care of children on your own.  Rest. Eating and drinking  If you  vomit, drink water, juice, or soup when you can drink without vomiting.  Drink enough fluid to keep your urine clear or pale yellow.  Make sure you have little or no nausea before eating solid foods.  Follow the diet recommended by your health care provider. General instructions  Have a responsible adult stay with you until you are awake and alert.  Return to your normal activities as told by your health care provider. Ask your health care provider what activities are safe for you.  Take over-the-counter and prescription medicines only as told by your health care provider.  If you smoke, do not smoke without supervision.  Keep all follow-up visits as told by your health care provider. This is important. Contact a health care provider if:  You continue to have nausea or vomiting at home, and medicines are not helpful.  You cannot drink fluids or start eating again.  You cannot urinate after 8-12 hours.  You develop a skin rash.  You have fever.  You have increasing redness at the site of your procedure. Get help right away if:  You have difficulty breathing.  You have chest pain.  You have unexpected bleeding.  You feel that you are having a life-threatening or urgent problem. This information is not intended to replace advice given to you by your health care provider. Make sure you discuss any questions you have with your health care provider. Document Released: 01/01/2001 Document Revised: 02/28/2016 Document Reviewed: 09/09/2015 Elsevier Interactive Patient Education  [employer].

## 2017-08-08 ENCOUNTER — Encounter (HOSPITAL_COMMUNITY): Payer: Self-pay

## 2017-08-08 ENCOUNTER — Encounter (HOSPITAL_COMMUNITY)
Admission: RE | Admit: 2017-08-08 | Discharge: 2017-08-08 | Disposition: A | Discharge status: Home / Self Care | Payer: Medicare Other | Source: Ambulatory Visit | Attending: Urology | Admitting: Urology

## 2017-08-08 DIAGNOSIS — Z01812 Encounter for preprocedural laboratory examination: Secondary | ICD-10-CM | POA: Diagnosis not present

## 2017-08-08 HISTORY — DX: Chronic obstructive pulmonary disease, unspecified: J44.9

## 2017-08-08 HISTORY — DX: Cardiac murmur, unspecified: R01.1

## 2017-08-08 LAB — BASIC METABOLIC PANEL
Anion gap: 9 (ref 5–15)
BUN: 9 mg/dL (ref 6–20)
CALCIUM: 9.7 mg/dL (ref 8.9–10.3)
CO2: 24 mmol/L (ref 22–32)
CREATININE: 0.84 mg/dL (ref 0.61–1.24)
Chloride: 106 mmol/L (ref 101–111)
GFR calc Af Amer: >60 mL/min (ref >60)
GFR calc non Af Amer: >60 mL/min (ref >60)
GLUCOSE: 94 mg/dL (ref 65–99)
Potassium: 4.2 mmol/L (ref 3.5–5.1)
Sodium: 139 mmol/L (ref 135–145)

## 2017-08-08 LAB — CBC WITH DIFFERENTIAL/PLATELET
BASOS ABS: 0 10*3/uL (ref 0.0–0.1)
Basophils Relative: 0 %
EOS PCT: 3 %
Eosinophils Absolute: 0.2 10*3/uL (ref 0.0–0.7)
HEMATOCRIT: 45.1 % (ref 39.0–52.0)
Hemoglobin: 14.7 g/dL (ref 13.0–17.0)
LYMPHS PCT: 43 %
Lymphs Abs: 2.4 10*3/uL (ref 0.7–4.0)
MCH: 29.2 pg (ref 26.0–34.0)
MCHC: 32.6 g/dL (ref 30.0–36.0)
MCV: 89.5 fL (ref 78.0–100.0)
Monocytes Absolute: 0.5 10*3/uL (ref 0.1–1.0)
Monocytes Relative: 10 %
Neutro Abs: 2.5 10*3/uL (ref 1.7–7.7)
Neutrophils Relative %: 44 %
Platelets: 166 10*3/uL (ref 150–400)
RBC: 5.04 MIL/uL (ref 4.22–5.81)
RDW: 13.2 % (ref 11.5–15.5)
WBC: 5.6 10*3/uL (ref 4.0–10.5)

## 2017-08-13 ENCOUNTER — Ambulatory Visit (HOSPITAL_COMMUNITY): Payer: Medicare Other | Admitting: Anesthesiology

## 2017-08-13 ENCOUNTER — Encounter (HOSPITAL_COMMUNITY): Payer: Self-pay | Admitting: *Deleted

## 2017-08-13 ENCOUNTER — Encounter (HOSPITAL_COMMUNITY)
Admission: RE | Disposition: A | Discharge status: Home / Self Care | Payer: Self-pay | Source: Ambulatory Visit | Attending: Urology

## 2017-08-13 ENCOUNTER — Ambulatory Visit (HOSPITAL_COMMUNITY)
Admission: RE | Admit: 2017-08-13 | Discharge: 2017-08-13 | Disposition: A | Discharge status: Home / Self Care | Payer: Medicare Other | Source: Ambulatory Visit | Attending: Urology | Admitting: Urology

## 2017-08-13 DIAGNOSIS — Z7983 Long term (current) use of bisphosphonates: Secondary | ICD-10-CM | POA: Diagnosis not present

## 2017-08-13 DIAGNOSIS — J449 Chronic obstructive pulmonary disease, unspecified: Secondary | ICD-10-CM | POA: Insufficient documentation

## 2017-08-13 DIAGNOSIS — N433 Hydrocele, unspecified: Secondary | ICD-10-CM | POA: Diagnosis not present

## 2017-08-13 DIAGNOSIS — Z87891 Personal history of nicotine dependence: Secondary | ICD-10-CM | POA: Insufficient documentation

## 2017-08-13 DIAGNOSIS — Z79899 Other long term (current) drug therapy: Secondary | ICD-10-CM | POA: Insufficient documentation

## 2017-08-13 DIAGNOSIS — I4510 Unspecified right bundle-branch block: Secondary | ICD-10-CM | POA: Insufficient documentation

## 2017-08-13 DIAGNOSIS — I4891 Unspecified atrial fibrillation: Secondary | ICD-10-CM | POA: Diagnosis not present

## 2017-08-13 DIAGNOSIS — K219 Gastro-esophageal reflux disease without esophagitis: Secondary | ICD-10-CM | POA: Diagnosis not present

## 2017-08-13 DIAGNOSIS — Z7901 Long term (current) use of anticoagulants: Secondary | ICD-10-CM | POA: Insufficient documentation

## 2017-08-13 HISTORY — PX: HYDROCELE EXCISION: SHX482

## 2017-08-13 SURGERY — HYDROCELECTOMY
Anesthesia: General | Site: Scrotum | Laterality: Right

## 2017-08-13 MED ORDER — MIDAZOLAM HCL 2 MG/2ML IJ SOLN
INTRAMUSCULAR | Status: AC
  Filled 2017-08-13: qty 2

## 2017-08-13 MED ORDER — 0.9 % SODIUM CHLORIDE (POUR BTL) OPTIME
TOPICAL | Status: DC | PRN
  Administered 2017-08-13: 1000 mL

## 2017-08-13 MED ORDER — LIDOCAINE HCL (CARDIAC) 20 MG/ML IV SOLN
INTRAVENOUS | Status: DC | PRN
  Administered 2017-08-13: 30 mg via INTRAVENOUS

## 2017-08-13 MED ORDER — OXYCODONE-ACETAMINOPHEN 5-325 MG PO TABS: 1.0000 | ORAL_TABLET | ORAL | 0 refills | Status: DC | PRN

## 2017-08-13 MED ORDER — CEFAZOLIN SODIUM-DEXTROSE 2-4 GM/100ML-% IV SOLN
2.0000 g | INTRAVENOUS | Status: AC
  Administered 2017-08-13: 2 g via INTRAVENOUS
  Filled 2017-08-13: qty 100

## 2017-08-13 MED ORDER — ONDANSETRON 4 MG PO TBDP
4.0000 mg | ORAL_TABLET | Freq: Once | ORAL | Status: AC
  Administered 2017-08-13: 4 mg via ORAL

## 2017-08-13 MED ORDER — BUPIVACAINE HCL (PF) 0.25 % IJ SOLN
INTRAMUSCULAR | Status: AC
  Filled 2017-08-13: qty 30

## 2017-08-13 MED ORDER — MIDAZOLAM HCL 2 MG/2ML IJ SOLN
1.0000 mg | Freq: Once | INTRAMUSCULAR | Status: AC | PRN
  Administered 2017-08-13: 2 mg via INTRAVENOUS

## 2017-08-13 MED ORDER — ONDANSETRON 4 MG PO TBDP
ORAL_TABLET | ORAL | Status: AC
  Filled 2017-08-13: qty 1

## 2017-08-13 MED ORDER — FENTANYL CITRATE (PF) 100 MCG/2ML IJ SOLN
INTRAMUSCULAR | Status: DC | PRN
  Administered 2017-08-13: 50 ug via INTRAVENOUS

## 2017-08-13 MED ORDER — BUPIVACAINE HCL (PF) 0.25 % IJ SOLN
INTRAMUSCULAR | Status: DC | PRN
  Administered 2017-08-13: 10 mL

## 2017-08-13 MED ORDER — LACTATED RINGERS IV SOLN
INTRAVENOUS | Status: DC
  Administered 2017-08-13: 1000 mL via INTRAVENOUS

## 2017-08-13 SURGICAL SUPPLY — 35 items
ADH SKN CLS APL DERMABOND .7 (GAUZE/BANDAGES/DRESSINGS) ×1
BAG HAMPER (MISCELLANEOUS) ×3 IMPLANT
BLADE SURG 15 STRL LF DISP TIS (BLADE) ×1 IMPLANT
BLADE SURG 15 STRL SS (BLADE) ×3
COVER LIGHT HANDLE STERIS (MISCELLANEOUS) ×4 IMPLANT
DERMABOND ADVANCED (GAUZE/BANDAGES/DRESSINGS) ×2
DERMABOND ADVANCED .7 DNX12 (GAUZE/BANDAGES/DRESSINGS) ×1 IMPLANT
ELECT NDL BLADE 2-5/6 (NEEDLE) ×1 IMPLANT
ELECT NEEDLE BLADE 2-5/6 (NEEDLE) ×3 IMPLANT
ELECT REM PT RETURN 9FT ADLT (ELECTROSURGICAL) ×3
ELECTRODE REM PT RTRN 9FT ADLT (ELECTROSURGICAL) ×1 IMPLANT
GAUZE SPONGE 4X4 12PLY STRL (GAUZE/BANDAGES/DRESSINGS) ×3 IMPLANT
GLOVE BIO SURGEON STRL SZ8 (GLOVE) ×3 IMPLANT
GLOVE BIOGEL PI IND STRL 7.0 (GLOVE) ×2 IMPLANT
GLOVE BIOGEL PI INDICATOR 7.0 (GLOVE) ×4
GLOVE ECLIPSE 6.5 STRL STRAW (GLOVE) ×3 IMPLANT
GOWN STRL REUS W/ TWL LRG LVL3 (GOWN DISPOSABLE) ×1 IMPLANT
GOWN STRL REUS W/ TWL XL LVL3 (GOWN DISPOSABLE) ×1 IMPLANT
GOWN STRL REUS W/TWL LRG LVL3 (GOWN DISPOSABLE) ×3
GOWN STRL REUS W/TWL XL LVL3 (GOWN DISPOSABLE) ×3
KIT ROOM TURNOVER APOR (KITS) ×3 IMPLANT
MANIFOLD NEPTUNE II (INSTRUMENTS) ×2 IMPLANT
NEEDLE HYPO 25X1 1.5 SAFETY (NEEDLE) ×3 IMPLANT
NS IRRIG 1000ML POUR BTL (IV SOLUTION) ×3 IMPLANT
PACK MINOR (CUSTOM PROCEDURE TRAY) ×3 IMPLANT
PAD ARMBOARD 7.5X6 YLW CONV (MISCELLANEOUS) ×3 IMPLANT
SET BASIN LINEN APH (SET/KITS/TRAYS/PACK) ×3 IMPLANT
SUPPORT SCROTAL LG STRP (MISCELLANEOUS) ×2 IMPLANT
SUPPORTER ATHLETIC LG (MISCELLANEOUS) ×1
SUT ETHILON 4 0 PS 2 18 (SUTURE) ×3 IMPLANT
SUT MNCRL AB 4-0 PS2 18 (SUTURE) ×6 IMPLANT
SUT VIC AB 3-0 SH 27 (SUTURE) ×3
SUT VIC AB 3-0 SH 27X BRD (SUTURE) IMPLANT
SYR CONTROL 10ML LL (SYRINGE) ×3 IMPLANT
YANKAUER SUCT 12FT TUBE ARGYLE (SUCTIONS) ×3 IMPLANT

## 2017-08-13 NOTE — Anesthesia Procedure Notes (Signed)
Procedure Name: LMA Insertion Date/Time: 08/13/2017 8:56 AM Performed by: Adams, Amy A, CRNA Pre-anesthesia Checklist: Patient identified, Timeout performed, Emergency Drugs available, Suction available and Patient being monitored Patient Re-evaluated:Patient Re-evaluated prior to induction Oxygen Delivery Method: Circle system utilized Preoxygenation: Pre-oxygenation with 100% oxygen Induction Type: IV induction Ventilation: Mask ventilation without difficulty LMA: LMA inserted LMA Size: 5.0 Number of attempts: 1 Tube secured with: Tape Dental Injury: Teeth and Oropharynx as per pre-operative assessment

## 2017-08-13 NOTE — Transfer of Care (Signed)
Immediate Anesthesia Transfer of Care Note  Patient: Kenneth [name]  Procedure(s) Performed: RIGHT HYDROCELECTOMY (Right Scrotum)  Patient Location: PACU  Anesthesia Type:General  Level of Consciousness: awake, alert , oriented and patient cooperative  Airway & Oxygen Therapy: Patient Spontanous Breathing and Patient connected to nasal cannula oxygen  Post-op Assessment: Report given to RN and Post -op Vital signs reviewed and stable  Post vital signs: Reviewed and stable  Last Vitals:  Vitals:   08/13/17 0835 08/13/17 0840  BP: (!) 133/92 130/90  Pulse:    Resp: 16 18  Temp:    SpO2: 96% 94%    Last Pain:  Vitals:   08/13/17 0757  TempSrc: Oral      Patients Stated Pain Goal: 7 (28/[id number]/62 [id number])  Complications: No apparent anesthesia complications

## 2017-08-13 NOTE — Discharge Instructions (Signed)
Hydrocelectomy, Adult, Care After This sheet gives you information about how to care for yourself after your procedure. Your health care provider may also give you more specific instructions. If you have problems or questions, contact your health care provider. What can I expect after the procedure? After your procedure, it is common to have mild discomfort, swelling, and bruising in the pouch that holds your testicles (scrotum). Follow these instructions at home: Bathing  Ask your health care provider when you can shower, take baths, or go swimming.  If you were told to wear an athletic support strap, take it off when you shower or take a bath. Incision care  Follow instructions from your health care provider about how to take care of your incision. Make sure you: ? Wash your hands with soap and water before you change your bandage (dressing). If soap and water are not available, use hand sanitizer. ? Change your dressing as told by your health care provider. ? Leave stitches (sutures) in place.  Check your incision and scrotum every day for signs of infection. Check for: ? More redness, swelling, or pain. ? Blood or fluid. ? Warmth. ? Pus or a bad smell. Managing pain, stiffness, and swelling  If directed, apply ice to the injured area: ? Put ice in a plastic bag. ? Place a towel between your skin and the bag. ? Leave the ice on for 20 minutes, 2-3 times per day. Driving  Do not drive for 24 hours if you were given a sedative.  Do not drive or use heavy machinery while taking prescription pain medicine.  Ask your health care provider when it is safe to drive. Activity  Do not do any activities that require great strength and energy (are vigorous) for as long as told by your health care provider.  Return to your normal activities as told by your health care provider. Ask your health care provider what activities are safe for you.  Do not lift anything that is heavier than 10  lb (4.5 kg) until your health care provider says that it is safe. General instructions  Take over-the-counter and prescription medicines only as told by your health care provider.  Keep all follow-up visits as told by your health care provider. This is important.  If you were given an athletic support strap, wear it as told by your health care provider.  If you had a drain put in during the procedure, you will need to return for a follow-up visit to have it removed. Contact a health care provider if:  Your pain is not controlled with medicine.  You have more redness or swelling around your scrotum.  You have blood or fluid coming from your scrotum.  Your incision feels warm to the touch.  You have pus or a bad smell coming from your scrotum.  You have a fever. This information is not intended to replace advice given to you by your health care provider. Make sure you discuss any questions you have with your health care provider. Document Released: 06/16/2015 Document Revised: 06/24/2016 Document Reviewed: 06/24/2016 Elsevier Interactive Patient Education  2018 [place] POST-ANESTHESIA  IMMEDIATELY FOLLOWING SURGERY:  Do not drive or operate machinery for the first twenty four hours after surgery.  Do not make any important decisions for twenty four hours after surgery or while taking narcotic pain medications or sedatives.  If you develop intractable nausea and vomiting or a severe headache please notify your doctor immediately.  FOLLOW-UP:  Please make an appointment with your surgeon as instructed. You do not need to follow up with anesthesia unless specifically instructed to do so.  WOUND CARE INSTRUCTIONS (if applicable):  Keep a dry clean dressing on the anesthesia/puncture wound site if there is drainage.  Once the wound has quit draining you may leave it open to air.  Generally you should leave the bandage intact for twenty four hours unless there  is drainage.  If the epidural site drains for more than 36-48 hours please call the anesthesia department.  QUESTIONS?:  Please feel free to call your physician or the hospital operator if you have any questions, and they will be happy to assist you.

## 2017-08-13 NOTE — Anesthesia Preprocedure Evaluation (Signed)
Anesthesia Evaluation  Patient identified by MRN, date of birth, ID band Patient awake    Airway Mallampati: I  TM Distance: >3 FB Neck ROM: Full    Dental  (+) Edentulous Lower, Edentulous Upper   Pulmonary COPD, former smoker,    Pulmonary exam normal breath sounds clear to auscultation       Cardiovascular Exercise Tolerance: Good + dysrhythmias (bradycardia hx) Atrial Fibrillation + Valvular Problems/Murmurs MR  Rhythm:Irregular Rate:Tachycardia  April 2018 EF 65-70% Moderate MR  ECG Sept 2018 Atrial fibrillation  -Right bundle branch block.   Neuro/Psych    GI/Hepatic GERD  Medicated and Controlled,  Endo/Other    Renal/GU      Musculoskeletal  (+) Arthritis ,   Abdominal Normal abdominal exam  (+)   Peds  Hematology  (+) anemia ,   Anesthesia Other Findings   Reproductive/Obstetrics                             Anesthesia Physical Anesthesia Plan  ASA: III  Anesthesia Plan: General   Post-op Pain Management:    Induction:   PONV Risk Score and Plan: 2  Airway Management Planned: LMA  Additional Equipment:   Intra-op Plan:   Post-operative Plan: Extubation in OR  Informed Consent: I have reviewed the patients History and Physical, chart, labs and discussed the procedure including the risks, benefits and alternatives for the proposed anesthesia with the patient or authorized representative who has indicated his/her understanding and acceptance.     Plan Discussed with: CRNA and Surgeon  Anesthesia Plan Comments:         Anesthesia Quick Evaluation

## 2017-08-13 NOTE — H&P (Signed)
Urology Admission H&P  Chief Complaint: right hydrocele  History of Present Illness: [name] is a 66yo with a right hydrocele that has been growing in size and causeing pain with difficulty urinating and ambulating  Past Medical History:  Diagnosis Date  . Anemia   . Arthritis    osteoporosis  . Blood transfusion   . Bradycardia   . COPD (chronic obstructive pulmonary disease) ([place])   . Dysrhythmia    had workup 2012 by dr Lattie Haw, see epic note  . GERD (gastroesophageal reflux disease)   . Heart murmur    Past Surgical History:  Procedure Laterality Date  . APPENDECTOMY    . CATARACT EXTRACTION Bilateral   . HEMORROIDECTOMY  2009   [name]  . HERNIA REPAIR     right and left inguinal hernia repairs  . MANDIBLE RECONSTRUCTION     from [facility]  . MIDDLE EAR SURGERY Right    Dr Benjamine Mola 02-19-15  . ROTATOR CUFF REPAIR Left 03-31-2016  . UMBILICAL HERNIA REPAIR      Home Medications:  Current Facility-Administered Medications  Medication Dose Route Frequency Provider Last Rate Last Dose  . ceFAZolin (ANCEF) IVPB 2g/100 mL premix  2 g Intravenous 30 min Pre-Op McKenzie, Candee Furbish, MD       Allergies: No Known Allergies  Family History  Problem Relation Age of Onset  . Aortic aneurysm Unknown        Deceased  . Cancer Unknown        Deceased  . Cardiomyopathy Unknown        Alive  . Anesthesia problems Neg Hx   . Hypotension Neg Hx   . Malignant hyperthermia Neg Hx   . Pseudochol deficiency Neg Hx    Social History:  reports that he quit smoking about 9 years ago. His smoking use included cigarettes. He has a 21.00 pack-year smoking history. He has quit using smokeless tobacco. He reports that he does not drink alcohol or use drugs.  Review of Systems  All other systems reviewed and are negative.   Physical Exam:  Vital signs in last 24 hours:   Physical Exam  Constitutional: He is oriented to person, place, and time. He appears well-developed and well-nourished.   HENT:  Head: Normocephalic and atraumatic.  Eyes: EOM are normal. Pupils are equal, round, and reactive to light.  Neck: Normal range of motion. No thyromegaly present.  Cardiovascular: Normal rate and regular rhythm.  Respiratory: Effort normal. No respiratory distress.  GI: Soft. He exhibits no distension.  Musculoskeletal: Normal range of motion. He exhibits no edema.  Neurological: He is alert and oriented to person, place, and time.  Skin: Skin is warm and dry.  Psychiatric: He has a normal mood and affect. His behavior is normal. Judgment and thought content normal.    Laboratory Data:  No results found for this or any previous visit (from the past 24 hour(s)). No results found for this or any previous visit (from the past 240 hour(s)). Creatinine: Recent Labs    08/08/17 1452  CREATININE 0.84   Baseline Creatinine: 0.8  Impression/Assessment:  66yo with a right hydrocele  Plan:  The risks/benefits/alternatives to right hydrocelectomy was explained to the patient and he understands and wishes to proceed with surgery  Nicolette Bang 08/13/2017, 7:47 AM

## 2017-08-13 NOTE — Anesthesia Postprocedure Evaluation (Signed)
Anesthesia Post Note  Patient: Kenneth [name]  Procedure(s) Performed: RIGHT HYDROCELECTOMY (Right Scrotum)  Patient location during evaluation: PACU Anesthesia Type: General Level of consciousness: awake and alert, oriented and patient cooperative Pain management: pain level controlled Vital Signs Assessment: post-procedure vital signs reviewed and stable Respiratory status: spontaneous breathing Cardiovascular status: stable Postop Assessment: no apparent nausea or vomiting Anesthetic complications: no     Last Vitals:  Vitals:   08/13/17 0840 08/13/17 0939  BP: 130/90 (!) 139/110  Pulse:    Resp: 18 18  Temp:  36.5 C  SpO2: 94% 96%    Last Pain:  Vitals:   08/13/17 0757  TempSrc: Oral                 Chue Berkovich A

## 2017-08-13 NOTE — Op Note (Signed)
Preoperative diagnosis: Right Hydrocele  Postoperative diagnosis: Same  Procedure: 1. Excision of right appendix testis 2. Right hydrocelectomy  Attending: Nicolette Bang, MD  Anesthesia: General  History of blood loss: Minimal  Antibiotics: ancef  Drains: none  Specimens:  none   Findings: 5cm hydrocele  Indications: Patient is a 66 year old male with a history of right hydrocele that was growing in size and causing him pain with walking.  We discussed the treatment options including observation versus excision after discussing treatment options he proceed with excision.   Procedure in detail: Prior to procedure consent was obtained.  Patient was brought to the operating room and a brief timeout was done to ensure correct patient, correct procedure, correct site.  General anesthesia was administered and patient was placed in supine position.  His genitalia was then prepped and draped in usual sterile fashion.  A 3 cm incision was made in the right hemiscrotum.  We dissected down to the tunica and then incised the tunica. A large hydrocele was encountered and was drained. We then excised the hydrocele sac and then over sewed the edge with 2-0 Vicryl in a running fashion. We then excised the right appendix testis. Hemostasis was then obtained with electrocautery. We then closed the defect in the epididymis with 3-0 vicryl in a running fashion. We then returned the testis to the left hemiscrotum and closed the overlying dartos with 3-0 vicryl in a running fashion. The skin was then closed with 4-0 monocryl in a running fashion. Dermabond was placed on the incision.  A dressing was then applied to the incision.  We then placed a scrotal fluff and this then concluded the procedure which was well tolerated by the patient.  Complications: None  Condition: Stable, extubated, transferred to PACU.  Plan: Patient is to be discharged home.  He is to follow up in 2 weeks for wound check.

## 2017-08-14 ENCOUNTER — Encounter (HOSPITAL_COMMUNITY): Payer: Self-pay | Admitting: Urology

## 2017-08-21 DIAGNOSIS — K7689 Other specified diseases of liver: Secondary | ICD-10-CM | POA: Diagnosis not present

## 2017-08-29 ENCOUNTER — Ambulatory Visit (INDEPENDENT_AMBULATORY_CARE_PROVIDER_SITE_OTHER): Payer: Medicare Other | Admitting: Urology

## 2017-08-29 DIAGNOSIS — N433 Hydrocele, unspecified: Secondary | ICD-10-CM

## 2017-09-26 DIAGNOSIS — Z961 Presence of intraocular lens: Secondary | ICD-10-CM | POA: Diagnosis not present

## 2017-09-26 DIAGNOSIS — H35372 Puckering of macula, left eye: Secondary | ICD-10-CM | POA: Diagnosis not present

## 2017-09-26 DIAGNOSIS — H35362 Drusen (degenerative) of macula, left eye: Secondary | ICD-10-CM | POA: Diagnosis not present

## 2017-09-26 DIAGNOSIS — H35033 Hypertensive retinopathy, bilateral: Secondary | ICD-10-CM | POA: Diagnosis not present

## 2017-10-08 ENCOUNTER — Ambulatory Visit (INDEPENDENT_AMBULATORY_CARE_PROVIDER_SITE_OTHER): Payer: Medicare Other | Admitting: Otolaryngology

## 2017-10-08 DIAGNOSIS — H7011 Chronic mastoiditis, right ear: Secondary | ICD-10-CM

## 2017-10-11 DIAGNOSIS — N433 Hydrocele, unspecified: Secondary | ICD-10-CM | POA: Diagnosis not present

## 2017-10-12 ENCOUNTER — Other Ambulatory Visit: Payer: Self-pay | Admitting: Urology

## 2017-10-12 DIAGNOSIS — N5089 Other specified disorders of the male genital organs: Secondary | ICD-10-CM

## 2017-10-16 DIAGNOSIS — Z87891 Personal history of nicotine dependence: Secondary | ICD-10-CM | POA: Diagnosis not present

## 2017-10-16 DIAGNOSIS — N433 Hydrocele, unspecified: Secondary | ICD-10-CM | POA: Diagnosis not present

## 2017-10-16 DIAGNOSIS — Z299 Encounter for prophylactic measures, unspecified: Secondary | ICD-10-CM | POA: Diagnosis not present

## 2017-10-16 DIAGNOSIS — Z6826 Body mass index (BMI) 26.0-26.9, adult: Secondary | ICD-10-CM | POA: Diagnosis not present

## 2017-10-16 DIAGNOSIS — J449 Chronic obstructive pulmonary disease, unspecified: Secondary | ICD-10-CM | POA: Diagnosis not present

## 2017-10-16 DIAGNOSIS — I4891 Unspecified atrial fibrillation: Secondary | ICD-10-CM | POA: Diagnosis not present

## 2017-10-16 DIAGNOSIS — Z2821 Immunization not carried out because of patient refusal: Secondary | ICD-10-CM | POA: Diagnosis not present

## 2017-10-22 ENCOUNTER — Ambulatory Visit (HOSPITAL_COMMUNITY)
Admission: RE | Admit: 2017-10-22 | Discharge: 2017-10-22 | Disposition: A | Discharge status: Home / Self Care | Payer: Medicare Other | Source: Ambulatory Visit | Attending: Urology | Admitting: Urology

## 2017-10-22 DIAGNOSIS — N433 Hydrocele, unspecified: Secondary | ICD-10-CM | POA: Diagnosis not present

## 2017-10-22 DIAGNOSIS — N5089 Other specified disorders of the male genital organs: Secondary | ICD-10-CM | POA: Diagnosis not present

## 2017-10-24 ENCOUNTER — Ambulatory Visit: Payer: Medicare Other | Admitting: Cardiology

## 2017-10-24 ENCOUNTER — Ambulatory Visit (INDEPENDENT_AMBULATORY_CARE_PROVIDER_SITE_OTHER): Payer: Medicare Other | Admitting: Urology

## 2017-10-24 DIAGNOSIS — N433 Hydrocele, unspecified: Secondary | ICD-10-CM | POA: Diagnosis not present

## 2017-10-29 ENCOUNTER — Other Ambulatory Visit: Payer: Self-pay | Admitting: Urology

## 2017-10-30 DIAGNOSIS — M542 Cervicalgia: Secondary | ICD-10-CM | POA: Diagnosis not present

## 2017-10-30 DIAGNOSIS — H9201 Otalgia, right ear: Secondary | ICD-10-CM | POA: Diagnosis not present

## 2017-10-30 DIAGNOSIS — I10 Essential (primary) hypertension: Secondary | ICD-10-CM | POA: Diagnosis not present

## 2017-10-30 DIAGNOSIS — I4891 Unspecified atrial fibrillation: Secondary | ICD-10-CM | POA: Diagnosis not present

## 2017-10-30 DIAGNOSIS — Z299 Encounter for prophylactic measures, unspecified: Secondary | ICD-10-CM | POA: Diagnosis not present

## 2017-10-30 DIAGNOSIS — Z6827 Body mass index (BMI) 27.0-27.9, adult: Secondary | ICD-10-CM | POA: Diagnosis not present

## 2017-11-05 NOTE — Patient Instructions (Signed)
Kenneth [name]  11/05/2017     @PREFPERIOPPHARMACY @   Your procedure is scheduled on  11/12/2017 .  Report to [facility] at  615  A.M.  Call this number if you have problems the morning of surgery:  [phone]   Remember:  Do not eat food or drink liquids after midnight.  Take these medicines the morning of surgery with A SIP OF WATER  prilosec.   Do not wear jewelry, make-up or nail polish.  Do not wear lotions, powders, or perfumes, or deodorant.  Do not shave 48 hours prior to surgery.  Men may shave face and neck.  Do not bring valuables to the hospital.  [facility] is not responsible for any belongings or valuables.  Contacts, dentures or bridgework may not be worn into surgery.  Leave your suitcase in the car.  After surgery it may be brought to your room.  For patients admitted to the hospital, discharge time will be determined by your treatment team.  Patients discharged the day of surgery will not be allowed to drive home.   Name and phone number of your driver:   family Special instructions:  None  Please read over the following fact sheets that you were given. Anesthesia Post-op Instructions and Care and Recovery After Surgery       Hydrocelectomy, Adult A hydrocelectomy is a surgical procedure to remove a collection of fluid (hydrocele) from the pouch that holds the testicles (scrotum). You may need to have a hydrocelectomy if a hydrocele is making your scrotum swell painfully. Tell a health care provider about:  Any allergies you have.  All medicines you are taking, including vitamins, herbs, eye drops, creams, and over-the-counter medicines.  Any problems you or family members have had with anesthetic medicines.  Any blood disorders you have.  Any surgeries you have had.  Any medical conditions you have. What are the risks? Generally this is a safe procedure. However, problems may occur, including:  Bleeding into the scrotum (scrotal  hematoma).  Damage to the testicle or the tube that carries sperm out of the testicle (vas deferens).  Infection.  Allergic reactions to medicines.  What happens before the procedure? Staying hydrated Follow instructions from your health care provider about hydration, which may include:  Up to 2 hours before the procedure - you may continue to drink clear liquids, such as water, clear fruit juice, black coffee, and plain tea.  Eating and drinking restrictions Follow instructions from your health care provider about eating and drinking, which may include:  8 hours before the procedure - stop eating heavy meals or foods such as meat, fried foods, or fatty foods.  6 hours before the procedure - stop eating light meals or foods, such as toast or cereal.  6 hours before the procedure - stop drinking milk or drinks that contain milk.  2 hours before the procedure - stop drinking clear liquids.  Medicines  Ask your health care provider about: ? Changing or stopping your regular medicines. This is especially important if you are taking diabetes medicines or blood thinners. ? Taking medicines such as aspirin and ibuprofen. These medicines can thin your blood. Do not take these medicines before your procedure if your health care provider instructs you not to.  You may be given antibiotic medicine to help prevent infection. General instructions  Plan to have someone take you home after the procedure. What happens during the procedure?  To  reduce your risk of infection: ? Your health care team will wash or sanitize their hands. ? A germ-killing solution (antiseptic) will be used to wash your scrotum and the area around it. Hair may be removed from this area.  An IV tube will be inserted into one of your veins.  You will be given one or more of the following: ? A medicine to make you relax (sedative). ? A medicine to make you fall asleep (general anesthetic).  A small incision will  be made through the skin of your scrotum.  Your testicle and the hydrocele will be located, and the hydrocele sac will be opened with an incision.  The fluid will be drained from the hydrocele.  The hydrocele will be closed with absorbable stitches (sutures) to prevent fluid from building up again.  If you have a large hydrocele, a thin rubber drain may be placed to allow fluid to drain after the procedure.  The incision in your scrotum will be closed with absorbable sutures.  A bandage (dressing) will be placed over the incision. The procedure may vary among health care providers and hospitals. What happens after the procedure?  Your blood pressure, heart rate, breathing rate, and blood oxygen level will be monitored until the medicines you were given have worn off.  You will be given pain medicine as needed.  Do not drive for 24 hours if you were given a sedative.  You may have to wear an athletic support strap to hold the dressing in place and support your scrotum. This information is not intended to replace advice given to you by your health care provider. Make sure you discuss any questions you have with your health care provider. Document Released: 06/16/2015 Document Revised: 06/24/2016 Document Reviewed: 06/24/2016 Elsevier Interactive Patient Education  2018 [place], Adult, Care After This sheet gives you information about how to care for yourself after your procedure. Your health care provider may also give you more specific instructions. If you have problems or questions, contact your health care provider. What can I expect after the procedure? After your procedure, it is common to have mild discomfort, swelling, and bruising in the pouch that holds your testicles (scrotum). Follow these instructions at home: Bathing  Ask your health care provider when you can shower, take baths, or go swimming.  If you were told to wear an athletic support strap,  take it off when you shower or take a bath. Incision care  Follow instructions from your health care provider about how to take care of your incision. Make sure you: ? Wash your hands with soap and water before you change your bandage (dressing). If soap and water are not available, use hand sanitizer. ? Change your dressing as told by your health care provider. ? Leave stitches (sutures) in place.  Check your incision and scrotum every day for signs of infection. Check for: ? More redness, swelling, or pain. ? Blood or fluid. ? Warmth. ? Pus or a bad smell. Managing pain, stiffness, and swelling  If directed, apply ice to the injured area: ? Put ice in a plastic bag. ? Place a towel between your skin and the bag. ? Leave the ice on for 20 minutes, 2-3 times per day. Driving  Do not drive for 24 hours if you were given a sedative.  Do not drive or use heavy machinery while taking prescription pain medicine.  Ask your health care provider when it is safe to drive. Activity  Do not do any activities that require great strength and energy (are vigorous) for as long as told by your health care provider.  Return to your normal activities as told by your health care provider. Ask your health care provider what activities are safe for you.  Do not lift anything that is heavier than 10 lb (4.5 kg) until your health care provider says that it is safe. General instructions  Take over-the-counter and prescription medicines only as told by your health care provider.  Keep all follow-up visits as told by your health care provider. This is important.  If you were given an athletic support strap, wear it as told by your health care provider.  If you had a drain put in during the procedure, you will need to return for a follow-up visit to have it removed. Contact a health care provider if:  Your pain is not controlled with medicine.  You have more redness or swelling around your  scrotum.  You have blood or fluid coming from your scrotum.  Your incision feels warm to the touch.  You have pus or a bad smell coming from your scrotum.  You have a fever. This information is not intended to replace advice given to you by your health care provider. Make sure you discuss any questions you have with your health care provider. Document Released: 06/16/2015 Document Revised: 06/24/2016 Document Reviewed: 06/24/2016 Elsevier Interactive Patient Education  2018 [place] Anesthesia, Adult General anesthesia is the use of medicines to make a person "go to sleep" (be unconscious) for a medical procedure. General anesthesia is often recommended when a procedure:  Is long.  Requires you to be still or in an unusual position.  Is major and can cause you to lose blood.  Is impossible to do without general anesthesia.  The medicines used for general anesthesia are called general anesthetics. In addition to making you sleep, the medicines:  Prevent pain.  Control your blood pressure.  Relax your muscles.  Tell a health care provider about:  Any allergies you have.  All medicines you are taking, including vitamins, herbs, eye drops, creams, and over-the-counter medicines.  Any problems you or family members have had with anesthetic medicines.  Types of anesthetics you have had in the past.  Any bleeding disorders you have.  Any surgeries you have had.  Any medical conditions you have.  Any history of heart or lung conditions, such as heart failure, sleep apnea, or chronic obstructive pulmonary disease (COPD).  Whether you are pregnant or may be pregnant.  Whether you use tobacco, alcohol, marijuana, or street drugs.  Any history of Armed forces logistics/support/administrative officer.  Any history of depression or anxiety. What are the risks? Generally, this is a safe procedure. However, problems may occur, including:  Allergic reaction to anesthetics.  Lung and heart  problems.  Inhaling food or liquids from your stomach into your lungs (aspiration).  Injury to nerves.  Waking up during your procedure and being unable to move (rare).  Extreme agitation or a state of mental confusion (delirium) when you wake up from the anesthetic.  Air in the bloodstream, which can lead to stroke.  These problems are more likely to develop if you are having a major surgery or if you have an advanced medical condition. You can prevent some of these complications by answering all of your health care provider's questions thoroughly and by following all pre-procedure instructions. General anesthesia can cause side effects, including:  Nausea or  vomiting  A sore throat from the breathing tube.  Feeling cold or shivery.  Feeling tired, washed out, or achy.  Sleepiness or drowsiness.  Confusion or agitation.  What happens before the procedure? Staying hydrated Follow instructions from your health care provider about hydration, which may include:  Up to 2 hours before the procedure - you may continue to drink clear liquids, such as water, clear fruit juice, black coffee, and plain tea.  Eating and drinking restrictions Follow instructions from your health care provider about eating and drinking, which may include:  8 hours before the procedure - stop eating heavy meals or foods such as meat, fried foods, or fatty foods.  6 hours before the procedure - stop eating light meals or foods, such as toast or cereal.  6 hours before the procedure - stop drinking milk or drinks that contain milk.  2 hours before the procedure - stop drinking clear liquids.  Medicines  Ask your health care provider about: ? Changing or stopping your regular medicines. This is especially important if you are taking diabetes medicines or blood thinners. ? Taking medicines such as aspirin and ibuprofen. These medicines can thin your blood. Do not take these medicines before your  procedure if your health care provider instructs you not to. ? Taking new dietary supplements or medicines. Do not take these during the week before your procedure unless your health care provider approves them.  If you are told to take a medicine or to continue taking a medicine on the day of the procedure, take the medicine with sips of water. General instructions   Ask if you will be going home the same day, the following day, or after a longer hospital stay. ? Plan to have someone take you home. ? Plan to have someone stay with you for the first 24 hours after you leave the hospital or clinic.  For 3-6 weeks before the procedure, try not to use any tobacco products, such as cigarettes, chewing tobacco, and e-cigarettes.  You may brush your teeth on the morning of the procedure, but make sure to spit out the toothpaste. What happens during the procedure?  You will be given anesthetics through a mask and through an IV tube in one of your veins.  You may receive medicine to help you relax (sedative).  As soon as you are asleep, a breathing tube may be used to help you breathe.  An anesthesia specialist will stay with you throughout the procedure. He or she will help keep you comfortable and safe by continuing to give you medicines and adjusting the amount of medicine that you get. He or she will also watch your blood pressure, pulse, and oxygen levels to make sure that the anesthetics do not cause any problems.  If a breathing tube was used to help you breathe, it will be removed before you wake up. The procedure may vary among health care providers and hospitals. What happens after the procedure?  You will wake up, often slowly, after the procedure is complete, usually in a recovery area.  Your blood pressure, heart rate, breathing rate, and blood oxygen level will be monitored until the medicines you were given have worn off.  You may be given medicine to help you calm down if you  feel anxious or agitated.  If you will be going home the same day, your health care provider may check to make sure you can stand, drink, and urinate.  Your health care providers will  treat your pain and side effects before you go home.  Do not drive for 24 hours if you received a sedative.  You may: ? Feel nauseous and vomit. ? Have a sore throat. ? Have mental slowness. ? Feel cold or shivery. ? Feel sleepy. ? Feel tired. ? Feel sore or achy, even in parts of your body where you did not have surgery. This information is not intended to replace advice given to you by your health care provider. Make sure you discuss any questions you have with your health care provider. Document Released: 01/02/2008 Document Revised: 03/07/2016 Document Reviewed: 09/09/2015 Elsevier Interactive Patient Education  2018 [place] Anesthesia, Adult, Care After These instructions provide you with information about caring for yourself after your procedure. Your health care provider may also give you more specific instructions. Your treatment has been planned according to current medical practices, but problems sometimes occur. Call your health care provider if you have any problems or questions after your procedure. What can I expect after the procedure? After the procedure, it is common to have:  Vomiting.  A sore throat.  Mental slowness.  It is common to feel:  Nauseous.  Cold or shivery.  Sleepy.  Tired.  Sore or achy, even in parts of your body where you did not have surgery.  Follow these instructions at home: For at least 24 hours after the procedure:  Do not: ? Participate in activities where you could fall or become injured. ? Drive. ? Use heavy machinery. ? Drink alcohol. ? Take sleeping pills or medicines that cause drowsiness. ? Make important decisions or sign legal documents. ? Take care of children on your own.  Rest. Eating and drinking  If you vomit,  drink water, juice, or soup when you can drink without vomiting.  Drink enough fluid to keep your urine clear or pale yellow.  Make sure you have little or no nausea before eating solid foods.  Follow the diet recommended by your health care provider. General instructions  Have a responsible adult stay with you until you are awake and alert.  Return to your normal activities as told by your health care provider. Ask your health care provider what activities are safe for you.  Take over-the-counter and prescription medicines only as told by your health care provider.  If you smoke, do not smoke without supervision.  Keep all follow-up visits as told by your health care provider. This is important. Contact a health care provider if:  You continue to have nausea or vomiting at home, and medicines are not helpful.  You cannot drink fluids or start eating again.  You cannot urinate after 8-12 hours.  You develop a skin rash.  You have fever.  You have increasing redness at the site of your procedure. Get help right away if:  You have difficulty breathing.  You have chest pain.  You have unexpected bleeding.  You feel that you are having a life-threatening or urgent problem. This information is not intended to replace advice given to you by your health care provider. Make sure you discuss any questions you have with your health care provider. Document Released: 01/01/2001 Document Revised: 02/28/2016 Document Reviewed: 09/09/2015 Elsevier Interactive Patient Education  [employer].

## 2017-11-07 ENCOUNTER — Encounter (HOSPITAL_COMMUNITY)
Admission: RE | Admit: 2017-11-07 | Discharge: 2017-11-07 | Disposition: A | Discharge status: Home / Self Care | Payer: Medicare Other | Source: Ambulatory Visit | Attending: Urology | Admitting: Urology

## 2017-11-07 ENCOUNTER — Encounter (HOSPITAL_COMMUNITY): Payer: Self-pay

## 2017-11-09 MED ORDER — DOXYCYCLINE HYCLATE 100 MG IV SOLR
100.0000 mg | INTRAVENOUS | Status: DC
  Filled 2017-11-09 (×2): qty 100

## 2017-11-12 ENCOUNTER — Ambulatory Visit (HOSPITAL_COMMUNITY)
Admission: RE | Admit: 2017-11-12 | Discharge: 2017-11-12 | Disposition: A | Discharge status: Home / Self Care | Payer: Medicare Other | Source: Ambulatory Visit | Attending: Urology | Admitting: Urology

## 2017-11-12 ENCOUNTER — Ambulatory Visit (HOSPITAL_COMMUNITY): Payer: Medicare Other | Admitting: Anesthesiology

## 2017-11-12 ENCOUNTER — Encounter (HOSPITAL_COMMUNITY): Payer: Self-pay | Admitting: *Deleted

## 2017-11-12 ENCOUNTER — Encounter (HOSPITAL_COMMUNITY)
Admission: RE | Disposition: A | Discharge status: Home / Self Care | Payer: Self-pay | Source: Ambulatory Visit | Attending: Urology

## 2017-11-12 DIAGNOSIS — I4891 Unspecified atrial fibrillation: Secondary | ICD-10-CM | POA: Diagnosis not present

## 2017-11-12 DIAGNOSIS — M1990 Unspecified osteoarthritis, unspecified site: Secondary | ICD-10-CM | POA: Diagnosis not present

## 2017-11-12 DIAGNOSIS — N433 Hydrocele, unspecified: Secondary | ICD-10-CM | POA: Insufficient documentation

## 2017-11-12 DIAGNOSIS — Z87891 Personal history of nicotine dependence: Secondary | ICD-10-CM | POA: Insufficient documentation

## 2017-11-12 DIAGNOSIS — M810 Age-related osteoporosis without current pathological fracture: Secondary | ICD-10-CM | POA: Insufficient documentation

## 2017-11-12 DIAGNOSIS — Z79899 Other long term (current) drug therapy: Secondary | ICD-10-CM | POA: Diagnosis not present

## 2017-11-12 DIAGNOSIS — K219 Gastro-esophageal reflux disease without esophagitis: Secondary | ICD-10-CM | POA: Insufficient documentation

## 2017-11-12 DIAGNOSIS — R011 Cardiac murmur, unspecified: Secondary | ICD-10-CM | POA: Diagnosis not present

## 2017-11-12 DIAGNOSIS — I4510 Unspecified right bundle-branch block: Secondary | ICD-10-CM | POA: Insufficient documentation

## 2017-11-12 DIAGNOSIS — J449 Chronic obstructive pulmonary disease, unspecified: Secondary | ICD-10-CM | POA: Insufficient documentation

## 2017-11-12 DIAGNOSIS — R030 Elevated blood-pressure reading, without diagnosis of hypertension: Secondary | ICD-10-CM | POA: Insufficient documentation

## 2017-11-12 HISTORY — PX: HYDROCELE EXCISION: SHX482

## 2017-11-12 SURGERY — HYDROCELECTOMY
Anesthesia: General | Laterality: Right

## 2017-11-12 MED ORDER — LACTATED RINGERS IV SOLN
INTRAVENOUS | Status: DC
  Administered 2017-11-12: 07:00:00 via INTRAVENOUS

## 2017-11-12 MED ORDER — CEFAZOLIN SODIUM-DEXTROSE 2-4 GM/100ML-% IV SOLN
2.0000 g | INTRAVENOUS | Status: AC
  Administered 2017-11-12: 2 g via INTRAVENOUS
  Filled 2017-11-12: qty 100

## 2017-11-12 MED ORDER — 0.9 % SODIUM CHLORIDE (POUR BTL) OPTIME
TOPICAL | Status: DC | PRN
  Administered 2017-11-12: 1000 mL

## 2017-11-12 MED ORDER — FENTANYL CITRATE (PF) 100 MCG/2ML IJ SOLN
INTRAMUSCULAR | Status: DC | PRN
  Administered 2017-11-12: 50 ug via INTRAVENOUS

## 2017-11-12 MED ORDER — FENTANYL CITRATE (PF) 100 MCG/2ML IJ SOLN
25.0000 ug | INTRAMUSCULAR | Status: DC | PRN
  Administered 2017-11-12: 50 ug via INTRAVENOUS
  Filled 2017-11-12: qty 2

## 2017-11-12 MED ORDER — BUPIVACAINE HCL (PF) 0.25 % IJ SOLN
INTRAMUSCULAR | Status: DC | PRN
  Administered 2017-11-12: 10 mL

## 2017-11-12 MED ORDER — PROPOFOL 10 MG/ML IV BOLUS
INTRAVENOUS | Status: DC | PRN
  Administered 2017-11-12: 20 mg via INTRAVENOUS
  Administered 2017-11-12: 150 mg via INTRAVENOUS

## 2017-11-12 MED ORDER — LABETALOL HCL 5 MG/ML IV SOLN
INTRAVENOUS | Status: AC
  Filled 2017-11-12: qty 4

## 2017-11-12 MED ORDER — FENTANYL CITRATE (PF) 100 MCG/2ML IJ SOLN
INTRAMUSCULAR | Status: AC
  Filled 2017-11-12: qty 4

## 2017-11-12 MED ORDER — MIDAZOLAM HCL 2 MG/2ML IJ SOLN
1.0000 mg | INTRAMUSCULAR | Status: AC
  Administered 2017-11-12: 2 mg via INTRAVENOUS

## 2017-11-12 MED ORDER — LIDOCAINE HCL (PF) 1 % IJ SOLN
INTRAMUSCULAR | Status: AC
  Filled 2017-11-12: qty 5

## 2017-11-12 MED ORDER — SODIUM CHLORIDE 0.9 % IJ SOLN
INTRAMUSCULAR | Status: AC
  Filled 2017-11-12: qty 10

## 2017-11-12 MED ORDER — NON FORMULARY
Status: DC | PRN
  Administered 2017-11-12: 100 mg

## 2017-11-12 MED ORDER — APIXABAN 5 MG PO TABS: 5.0000 mg | ORAL_TABLET | Freq: Two times a day (BID) | ORAL | 0 refills | Status: DC

## 2017-11-12 MED ORDER — METOPROLOL TARTRATE 5 MG/5ML IV SOLN
INTRAVENOUS | Status: AC
  Filled 2017-11-12: qty 5

## 2017-11-12 MED ORDER — METOPROLOL TARTRATE 5 MG/5ML IV SOLN
2.5000 mg | Freq: Once | INTRAVENOUS | Status: AC
  Administered 2017-11-12: 2.5 mg via INTRAVENOUS

## 2017-11-12 MED ORDER — MIDAZOLAM HCL 5 MG/5ML IJ SOLN
INTRAMUSCULAR | Status: DC | PRN
  Administered 2017-11-12: 2 mg via INTRAVENOUS

## 2017-11-12 MED ORDER — LIDOCAINE HCL 1 % IJ SOLN
INTRAMUSCULAR | Status: DC | PRN
  Administered 2017-11-12: 30 mg via INTRADERMAL

## 2017-11-12 MED ORDER — EPHEDRINE SULFATE 50 MG/ML IJ SOLN
INTRAMUSCULAR | Status: AC
  Filled 2017-11-12: qty 1

## 2017-11-12 MED ORDER — MIDAZOLAM HCL 2 MG/2ML IJ SOLN
INTRAMUSCULAR | Status: AC
  Filled 2017-11-12: qty 2

## 2017-11-12 MED ORDER — PHENYLEPHRINE 40 MCG/ML (10ML) SYRINGE FOR IV PUSH (FOR BLOOD PRESSURE SUPPORT)
PREFILLED_SYRINGE | INTRAVENOUS | Status: AC
  Filled 2017-11-12: qty 10

## 2017-11-12 MED ORDER — EPHEDRINE SULFATE 50 MG/ML IJ SOLN
INTRAMUSCULAR | Status: DC | PRN
  Administered 2017-11-12: 10 mg via INTRAVENOUS

## 2017-11-12 MED ORDER — LABETALOL HCL 5 MG/ML IV SOLN
10.0000 mg | INTRAVENOUS | Status: AC | PRN
  Administered 2017-11-12 (×2): 10 mg via INTRAVENOUS

## 2017-11-12 MED ORDER — BUPIVACAINE HCL (PF) 0.25 % IJ SOLN
INTRAMUSCULAR | Status: AC
  Filled 2017-11-12: qty 30

## 2017-11-12 MED ORDER — SUCCINYLCHOLINE CHLORIDE 20 MG/ML IJ SOLN
INTRAMUSCULAR | Status: AC
  Filled 2017-11-12: qty 1

## 2017-11-12 MED ORDER — PROPOFOL 10 MG/ML IV BOLUS
INTRAVENOUS | Status: AC
  Filled 2017-11-12: qty 20

## 2017-11-12 MED ORDER — OXYCODONE-ACETAMINOPHEN 5-325 MG PO TABS: 1.0000 | ORAL_TABLET | ORAL | 0 refills | Status: DC | PRN

## 2017-11-12 SURGICAL SUPPLY — 28 items
BAG HAMPER (MISCELLANEOUS) ×2 IMPLANT
BLADE SURG 15 STRL LF DISP TIS (BLADE) ×1 IMPLANT
BLADE SURG 15 STRL SS (BLADE) ×2
COVER LIGHT HANDLE STERIS (MISCELLANEOUS) ×4 IMPLANT
ELECT REM PT RETURN 9FT ADLT (ELECTROSURGICAL) ×2
ELECTRODE REM PT RTRN 9FT ADLT (ELECTROSURGICAL) ×1 IMPLANT
GAUZE SPONGE 4X4 12PLY STRL (GAUZE/BANDAGES/DRESSINGS) ×2 IMPLANT
GLOVE BIO SURGEON STRL SZ7 (GLOVE) ×2 IMPLANT
GLOVE BIO SURGEON STRL SZ8 (GLOVE) ×2 IMPLANT
GLOVE BIOGEL PI IND STRL 7.0 (GLOVE) ×2 IMPLANT
GLOVE BIOGEL PI INDICATOR 7.0 (GLOVE) ×2
GOWN STRL REUS W/ TWL LRG LVL3 (GOWN DISPOSABLE) ×1 IMPLANT
GOWN STRL REUS W/ TWL XL LVL3 (GOWN DISPOSABLE) ×1 IMPLANT
GOWN STRL REUS W/TWL LRG LVL3 (GOWN DISPOSABLE) ×2
GOWN STRL REUS W/TWL XL LVL3 (GOWN DISPOSABLE) ×2
KIT ROOM TURNOVER APOR (KITS) ×2 IMPLANT
MANIFOLD NEPTUNE II (INSTRUMENTS) ×2 IMPLANT
NEEDLE HYPO 25X1 1.5 SAFETY (NEEDLE) ×2 IMPLANT
NS IRRIG 1000ML POUR BTL (IV SOLUTION) ×2 IMPLANT
PACK MINOR (CUSTOM PROCEDURE TRAY) ×2 IMPLANT
PAD ARMBOARD 7.5X6 YLW CONV (MISCELLANEOUS) ×2 IMPLANT
SET BASIN LINEN APH (SET/KITS/TRAYS/PACK) ×2 IMPLANT
SUPPORT SCROTAL LG STRP (MISCELLANEOUS) ×2 IMPLANT
SUT MNCRL AB 4-0 PS2 18 (SUTURE) ×2 IMPLANT
SUT VIC AB 3-0 SH 27 (SUTURE) ×4
SUT VIC AB 3-0 SH 27X BRD (SUTURE) ×2 IMPLANT
SYR CONTROL 10ML LL (SYRINGE) ×2 IMPLANT
YANKAUER SUCT 12FT TUBE ARGYLE (SUCTIONS) ×2 IMPLANT
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
## 2017-11-12 NOTE — Discharge Instructions (Signed)
PATIENT INSTRUCTIONS POST-ANESTHESIA  IMMEDIATELY FOLLOWING SURGERY:  Do not drive or operate machinery for the first twenty four hours after surgery.  Do not make any important decisions for twenty four hours after surgery or while taking narcotic pain medications or sedatives.  If you develop intractable nausea and vomiting or a severe headache please notify your doctor immediately.  FOLLOW-UP:  Please make an appointment with your surgeon as instructed. You do not need to follow up with anesthesia unless specifically instructed to do so.  WOUND CARE INSTRUCTIONS (if applicable):  Keep a dry clean dressing on the anesthesia/puncture wound site if there is drainage.  Once the wound has quit draining you may leave it open to air.  Generally you should leave the bandage intact for twenty four hours unless there is drainage.  If the epidural site drains for more than 36-48 hours please call the anesthesia department.  QUESTIONS?:  Please feel free to call your physician or the hospital operator if you have any questions, and they will be happy to assist you.      PATIENT INSTRUCTIONS POST-ANESTHESIA  IMMEDIATELY FOLLOWING SURGERY:  Do not drive or operate machinery for the first twenty four hours after surgery.  Do not make any important decisions for twenty four hours after surgery or while taking narcotic pain medications or sedatives.  If you develop intractable nausea and vomiting or a severe headache please notify your doctor immediately.  FOLLOW-UP:  Please make an appointment with your surgeon as instructed. You do not need to follow up with anesthesia unless specifically instructed to do so.  WOUND CARE INSTRUCTIONS (if applicable):  Keep a dry clean dressing on the anesthesia/puncture wound site if there is drainage.  Once the wound has quit draining you may leave it open to air.  Generally you should leave the bandage intact for twenty four hours unless there is drainage.  If the epidural  site drains for more than 36-48 hours please call the anesthesia department.  QUESTIONS?:  Please feel free to call your physician or the hospital operator if you have any questions, and they will be happy to assist you.      Hydrocelectomy, Adult, Care After This sheet gives you information about how to care for yourself after your procedure. Your health care provider may also give you more specific instructions. If you have problems or questions, contact your health care provider. What can I expect after the procedure? After your procedure, it is common to have mild discomfort, swelling, and bruising in the pouch that holds your testicles (scrotum). Follow these instructions at home: Bathing  Ask your health care provider when you can shower, take baths, or go swimming.  If you were told to wear an athletic support strap, take it off when you shower or take a bath. Incision care  Follow instructions from your health care provider about how to take care of your incision. Make sure you: ? Wash your hands with soap and water before you change your bandage (dressing). If soap and water are not available, use hand sanitizer. ? Change your dressing as told by your health care provider. ? Leave stitches (sutures) in place.  Check your incision and scrotum every day for signs of infection. Check for: ? More redness, swelling, or pain. ? Blood or fluid. ? Warmth. ? Pus or a bad smell. Managing pain, stiffness, and swelling  If directed, apply ice to the injured area: ? Put ice in a plastic bag. ? Place  a towel between your skin and the bag. ? Leave the ice on for 20 minutes, 2-3 times per day. Driving  Do not drive for 24 hours if you were given a sedative.  Do not drive or use heavy machinery while taking prescription pain medicine.  Ask your health care provider when it is safe to drive. Activity  Do not do any activities that require great strength and energy (are vigorous) for  as long as told by your health care provider.  Return to your normal activities as told by your health care provider. Ask your health care provider what activities are safe for you.  Do not lift anything that is heavier than 10 lb (4.5 kg) until your health care provider says that it is safe. General instructions  Take over-the-counter and prescription medicines only as told by your health care provider.  Keep all follow-up visits as told by your health care provider. This is important.  If you were given an athletic support strap, wear it as told by your health care provider.  If you had a drain put in during the procedure, you will need to return for a follow-up visit to have it removed. Contact a health care provider if:  Your pain is not controlled with medicine.  You have more redness or swelling around your scrotum.  You have blood or fluid coming from your scrotum.  Your incision feels warm to the touch.  You have pus or a bad smell coming from your scrotum.  You have a fever. This information is not intended to replace advice given to you by your health care provider. Make sure you discuss any questions you have with your health care provider. Document Released: 06/16/2015 Document Revised: 06/24/2016 Document Reviewed: 06/24/2016 Elsevier Interactive Patient Education  [employer].

## 2017-11-12 NOTE — Op Note (Signed)
Preoperative diagnosis: recurrent Right Hydrocele  Postoperative diagnosis: Same  Procedure: 1.  Right hydrocelectomy  Attending: Nicolette Bang, MD  Anesthesia: General  History of blood loss: Minimal  Antibiotics: ancef  Drains: none  Specimens: 1. Right hydrocele sac   Findings: 5cm recurrent right hydrocele  Indications: Patient is a 67 year old male with a history of right hydrocele who underwent hydrocelectomy and the hydrocele recurred. It is growing in size and causing him pain with walking.  We discussed the treatment options including observation versus excision after discussing treatment options he proceed with excision.   Procedure in detail: Prior to procedure consent was obtained.  Patient was brought to the operating room and a brief timeout was done to ensure correct patient, correct procedure, correct site.  General anesthesia was administered and patient was placed in supine position.  His genitalia was then prepped and draped in usual sterile fashion.  A 3 cm incision was made in the right hemiscrotum.  We dissected down to the tunica and then incised the tunica. A large hydrocele was encountered and was drained. We then excised the hydrocele sac and then over sewed the edge with 3-0 Vicryl in a running fashion. We then excised the right appendix testis. Hemostasis was then obtained with electrocautery. We then closed the defect in the epididymis with 3-0 vicryl in a running fashion. We then returned the testis to the left hemiscrotum and closed the overlying dartos with 3-0 vicryl in a running fashion.We then placed 100mg  of doxycycline in the space around the testicle. The skin was then closed with 4-0 monocryl in a running fashion. Dermabond was placed on the incision.  A dressing was then applied to the incision.  We then placed a scrotal fluff and this then concluded the procedure which was well tolerated by the patient.  Complications: None  Condition: Stable,  extubated, transferred to PACU.  Plan: Patient is to be discharged home.  He is to follow up in 2 weeks for wound check.

## 2017-11-12 NOTE — Anesthesia Procedure Notes (Signed)
Procedure Name: LMA Insertion Date/Time: 11/12/2017 7:45 AM Performed by: Charmaine Downs, CRNA Pre-anesthesia Checklist: Patient identified, Patient being monitored, Emergency Drugs available, Timeout performed and Suction available Patient Re-evaluated:Patient Re-evaluated prior to induction Oxygen Delivery Method: Circle System Utilized Preoxygenation: Pre-oxygenation with 100% oxygen Induction Type: IV induction Ventilation: Mask ventilation without difficulty LMA: LMA inserted LMA Size: 5.0 Number of attempts: 1 Placement Confirmation: positive ETCO2 and breath sounds checked- equal and bilateral Tube secured with: Tape Dental Injury: Teeth and Oropharynx as per pre-operative assessment

## 2017-11-12 NOTE — Anesthesia Preprocedure Evaluation (Signed)
Anesthesia Evaluation  Patient identified by MRN, date of birth, ID band Patient awake    Reviewed: Allergy & Precautions, NPO status , Patient's Chart, lab work & pertinent test results  Airway Mallampati: I  TM Distance: >3 FB Neck ROM: Full    Dental  (+) Edentulous Upper, Partial Lower, Loose, Dental Advisory Given,    Pulmonary COPD, former smoker,    Pulmonary exam normal breath sounds clear to auscultation       Cardiovascular Exercise Tolerance: Good hypertension (borderline), + dysrhythmias (bradycardia hx) Atrial Fibrillation + Valvular Problems/Murmurs MR  Rhythm:Irregular Rate:Normal  April 2018 EF 65-70% Moderate MR  ECG Sept 2018 Atrial fibrillation  -Right bundle branch block.   Neuro/Psych    GI/Hepatic GERD  Medicated and Controlled,  Endo/Other    Renal/GU      Musculoskeletal  (+) Arthritis ,   Abdominal Normal abdominal exam  (+)   Peds  Hematology  (+) anemia ,   Anesthesia Other Findings   Reproductive/Obstetrics                             Anesthesia Physical Anesthesia Plan  ASA: III  Anesthesia Plan: General   Post-op Pain Management:    Induction: Intravenous  PONV Risk Score and Plan: 2  Airway Management Planned: LMA  Additional Equipment:   Intra-op Plan:   Post-operative Plan: Extubation in OR  Informed Consent: I have reviewed the patients History and Physical, chart, labs and discussed the procedure including the risks, benefits and alternatives for the proposed anesthesia with the patient or authorized representative who has indicated his/her understanding and acceptance.     Plan Discussed with: CRNA and Surgeon  Anesthesia Plan Comments:         Anesthesia Quick Evaluation

## 2017-11-12 NOTE — Transfer of Care (Signed)
Immediate Anesthesia Transfer of Care Note  Patient: Kenneth [name]  Procedure(s) Performed: RIGHT HYDROCELECTOMY (Right )  Patient Location: PACU  Anesthesia Type:General  Level of Consciousness: awake and patient cooperative  Airway & Oxygen Therapy: Patient Spontanous Breathing and Patient connected to face mask oxygen  Post-op Assessment: Report given to RN, Post -op Vital signs reviewed and stable and Patient moving all extremities  Post vital signs: Reviewed and stable  Last Vitals:  Vitals:   11/12/17 0730 11/12/17 0735  BP: 128/82   Pulse:    Resp: (!) 23 (!) 27  Temp:    SpO2: 96% 95%    Last Pain:  Vitals:   11/12/17 0657  TempSrc: Oral      Patients Stated Pain Goal: 5 (60/73/71 0626)  Complications: No apparent anesthesia complications

## 2017-11-12 NOTE — Anesthesia Postprocedure Evaluation (Signed)
Anesthesia Post Note  Patient: Kenneth [name]  Procedure(s) Performed: RIGHT HYDROCELECTOMY (Right )  Patient location during evaluation: PACU Anesthesia Type: General Level of consciousness: awake and alert Pain management: pain level controlled Vital Signs Assessment: post-procedure vital signs reviewed and stable Respiratory status: spontaneous breathing and nonlabored ventilation Cardiovascular status: blood pressure returned to baseline Postop Assessment: no apparent nausea or vomiting Anesthetic complications: no     Last Vitals:  Vitals:   11/12/17 0850 11/12/17 0900  BP: 140/85 (!) 135/111  Pulse: 77 90  Resp: (!) 21 13  Temp: (!) 36.4 C   SpO2: 98% 100%    Last Pain:  Vitals:   11/12/17 0657  TempSrc: Oral                 [name]

## 2017-11-13 MED FILL — Doxycycline Hyclate For Inj 100 MG: INTRAVENOUS | Qty: 100 | Status: AC

## 2017-11-14 ENCOUNTER — Encounter (HOSPITAL_COMMUNITY): Payer: Self-pay | Admitting: Urology

## 2017-11-14 DIAGNOSIS — Z6827 Body mass index (BMI) 27.0-27.9, adult: Secondary | ICD-10-CM | POA: Diagnosis not present

## 2017-11-14 DIAGNOSIS — Z299 Encounter for prophylactic measures, unspecified: Secondary | ICD-10-CM | POA: Diagnosis not present

## 2017-11-14 DIAGNOSIS — E7800 Pure hypercholesterolemia, unspecified: Secondary | ICD-10-CM | POA: Diagnosis not present

## 2017-11-14 DIAGNOSIS — J449 Chronic obstructive pulmonary disease, unspecified: Secondary | ICD-10-CM | POA: Diagnosis not present

## 2017-11-14 DIAGNOSIS — Z87891 Personal history of nicotine dependence: Secondary | ICD-10-CM | POA: Diagnosis not present

## 2017-11-14 DIAGNOSIS — I4891 Unspecified atrial fibrillation: Secondary | ICD-10-CM | POA: Diagnosis not present

## 2017-11-14 DIAGNOSIS — I10 Essential (primary) hypertension: Secondary | ICD-10-CM | POA: Diagnosis not present

## 2017-11-15 ENCOUNTER — Telehealth: Payer: Self-pay | Admitting: Cardiology

## 2017-11-15 ENCOUNTER — Ambulatory Visit (INDEPENDENT_AMBULATORY_CARE_PROVIDER_SITE_OTHER): Payer: Medicare Other | Admitting: Cardiology

## 2017-11-15 ENCOUNTER — Encounter: Payer: Self-pay | Admitting: Cardiology

## 2017-11-15 VITALS — BP 130/88 | HR 86 | Ht 72.0 in | Wt 199.0 lb

## 2017-11-15 DIAGNOSIS — I340 Nonrheumatic mitral (valve) insufficiency: Secondary | ICD-10-CM | POA: Diagnosis not present

## 2017-11-15 DIAGNOSIS — R030 Elevated blood-pressure reading, without diagnosis of hypertension: Secondary | ICD-10-CM | POA: Diagnosis not present

## 2017-11-15 DIAGNOSIS — I4891 Unspecified atrial fibrillation: Secondary | ICD-10-CM | POA: Diagnosis not present

## 2017-11-15 NOTE — Patient Instructions (Signed)
Your physician recommends that you schedule a follow-up appointment in: [place]  Your physician recommends that you continue on your current medications as directed. Please refer to the Current Medication list given to you today.  Your physician has requested that you have an echocardiogram. Echocardiography is a painless test that uses sound waves to create images of your heart. It provides your doctor with information about the size and shape of your heart and how well your heart's chambers and valves are working. This procedure takes approximately one hour. There are no restrictions for this procedure.   DASH Eating Plan DASH stands for "Dietary Approaches to Stop Hypertension." The DASH eating plan is a healthy eating plan that has been shown to reduce high blood pressure (hypertension). It may also reduce your risk for type 2 diabetes, heart disease, and stroke. The DASH eating plan may also help with weight loss. What are tips for following this plan? General guidelines  Avoid eating more than 2,300 mg (milligrams) of salt (sodium) a day. If you have hypertension, you may need to reduce your sodium intake to 1,500 mg a day.  Limit alcohol intake to no more than 1 drink a day for nonpregnant women and 2 drinks a day for men. One drink equals 12 oz of beer, 5 oz of wine, or 1 oz of hard liquor.  Work with your health care provider to maintain a healthy body weight or to lose weight. Ask what an ideal weight is for you.  Get at least 30 minutes of exercise that causes your heart to beat faster (aerobic exercise) most days of the week. Activities may include walking, swimming, or biking.  Work with your health care provider or diet and nutrition specialist (dietitian) to adjust your eating plan to your individual calorie needs. Reading food labels  Check food labels for the amount of sodium per serving. Choose foods with less than 5 percent of the Daily Value of sodium.  Generally, foods with less than 300 mg of sodium per serving fit into this eating plan.  To find whole grains, look for the word "whole" as the first word in the ingredient list. Shopping  Buy products labeled as "low-sodium" or "no salt added."  Buy fresh foods. Avoid canned foods and premade or frozen meals. Cooking  Avoid adding salt when cooking. Use salt-free seasonings or herbs instead of table salt or sea salt. Check with your health care provider or pharmacist before using salt substitutes.  Do not fry foods. Cook foods using healthy methods such as baking, boiling, grilling, and broiling instead.  Cook with heart-healthy oils, such as olive, canola, soybean, or sunflower oil. Meal planning   Eat a balanced diet that includes: ? 5 or more servings of fruits and vegetables each day. At each meal, try to fill half of your plate with fruits and vegetables. ? Up to 6-8 servings of whole grains each day. ? Less than 6 oz of lean meat, poultry, or fish each day. A 3-oz serving of meat is about the same size as a deck of cards. One egg equals 1 oz. ? 2 servings of low-fat dairy each day. ? A serving of nuts, seeds, or beans 5 times each week. ? Heart-healthy fats. Healthy fats called Omega-3 fatty acids are found in foods such as flaxseeds and coldwater fish, like sardines, salmon, and mackerel.  Limit how much you eat of the following: ? Canned or prepackaged foods. ? Food that is high  in trans fat, such as fried foods. ? Food that is high in saturated fat, such as fatty meat. ? Sweets, desserts, sugary drinks, and other foods with added sugar. ? Full-fat dairy products.  Do not salt foods before eating.  Try to eat at least 2 vegetarian meals each week.  Eat more home-cooked food and less restaurant, buffet, and fast food.  When eating at a restaurant, ask that your food be prepared with less salt or no salt, if possible. What foods are recommended? The items listed may  not be a complete list. Talk with your dietitian about what dietary choices are best for you. Grains Whole-grain or whole-wheat bread. Whole-grain or whole-wheat pasta. Brown rice. Kenneth [name]. Bulgur. Whole-grain and low-sodium cereals. Pita bread. Low-fat, low-sodium crackers. Whole-wheat flour tortillas. Vegetables Fresh or frozen vegetables (raw, steamed, roasted, or grilled). Low-sodium or reduced-sodium tomato and vegetable juice. Low-sodium or reduced-sodium tomato sauce and tomato paste. Low-sodium or reduced-sodium canned vegetables. Fruits All fresh, dried, or frozen fruit. Canned fruit in natural juice (without added sugar). Meat and other protein foods Skinless chicken or [place]. Ground chicken or [place]. Pork with fat trimmed off. Fish and seafood. Egg whites. Dried beans, peas, or lentils. Unsalted nuts, nut butters, and seeds. Unsalted canned beans. Lean cuts of beef with fat trimmed off. Low-sodium, lean deli meat. Dairy Low-fat (1%) or fat-free (skim) milk. Fat-free, low-fat, or reduced-fat cheeses. Nonfat, low-sodium ricotta or cottage cheese. Low-fat or nonfat yogurt. Low-fat, low-sodium cheese. Fats and oils Soft margarine without trans fats. Vegetable oil. Low-fat, reduced-fat, or light mayonnaise and salad dressings (reduced-sodium). Canola, safflower, olive, soybean, and sunflower oils. Avocado. Seasoning and other foods Herbs. Spices. Seasoning mixes without salt. Unsalted popcorn and pretzels. Fat-free sweets. What foods are not recommended? The items listed may not be a complete list. Talk with your dietitian about what dietary choices are best for you. Grains Baked goods made with fat, such as croissants, muffins, or some breads. Dry pasta or rice meal packs. Vegetables Creamed or fried vegetables. Vegetables in a cheese sauce. Regular canned vegetables (not low-sodium or reduced-sodium). Regular canned tomato sauce and paste (not low-sodium or reduced-sodium).  Regular tomato and vegetable juice (not low-sodium or reduced-sodium). Kenneth [name]. Olives. Fruits Canned fruit in a light or heavy syrup. Fried fruit. Fruit in cream or butter sauce. Meat and other protein foods Fatty cuts of meat. Ribs. Fried meat. Berniece Salines. Sausage. Bologna and other processed lunch meats. Salami. Fatback. Hotdogs. Bratwurst. Salted nuts and seeds. Canned beans with added salt. Canned or smoked fish. Whole eggs or egg yolks. Chicken or [place] with skin. Dairy Whole or 2% milk, cream, and half-and-half. Whole or full-fat cream cheese. Whole-fat or sweetened yogurt. Full-fat cheese. Nondairy creamers. Whipped toppings. Processed cheese and cheese spreads. Fats and oils Butter. Stick margarine. Lard. Shortening. Ghee. Bacon fat. Tropical oils, such as coconut, palm kernel, or palm oil. Seasoning and other foods Salted popcorn and pretzels. Onion salt, garlic salt, seasoned salt, table salt, and sea salt. Worcestershire sauce. Tartar sauce. Barbecue sauce. Teriyaki sauce. Soy sauce, including reduced-sodium. Steak sauce. Canned and packaged gravies. Fish sauce. Oyster sauce. Cocktail sauce. Horseradish that you find on the shelf. Ketchup. Mustard. Meat flavorings and tenderizers. Bouillon cubes. Hot sauce and Tabasco sauce. Premade or packaged marinades. Premade or packaged taco seasonings. Relishes. Regular salad dressings. Where to find more information:  National Heart, Lung, and [place]: https://wilson-eaton.com/  American Heart Association: www.heart.org Summary  The DASH eating plan is a healthy eating plan  that has been shown to reduce high blood pressure (hypertension). It may also reduce your risk for type 2 diabetes, heart disease, and stroke.  With the DASH eating plan, you should limit salt (sodium) intake to 2,300 mg a day. If you have hypertension, you may need to reduce your sodium intake to 1,500 mg a day.  When on the DASH eating plan, aim to eat more fresh fruits and  vegetables, whole grains, lean proteins, low-fat dairy, and heart-healthy fats.  Work with your health care provider or diet and nutrition specialist (dietitian) to adjust your eating plan to your individual calorie needs. This information is not intended to replace advice given to you by your health care provider. Make sure you discuss any questions you have with your health care provider. Document Released: 09/14/2011 Document Revised: 09/18/2016 Document Reviewed: 09/18/2016 Elsevier Interactive Patient Education  [employer].

## 2017-11-15 NOTE — Telephone Encounter (Signed)
Pre-cert Verification for the following procedure   Echo scheduled for 11-22-17
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
## 2017-11-18 ENCOUNTER — Encounter: Payer: Self-pay | Admitting: Cardiology

## 2017-11-21 DIAGNOSIS — Z713 Dietary counseling and surveillance: Secondary | ICD-10-CM | POA: Diagnosis not present

## 2017-11-21 DIAGNOSIS — J449 Chronic obstructive pulmonary disease, unspecified: Secondary | ICD-10-CM | POA: Diagnosis not present

## 2017-11-21 DIAGNOSIS — I10 Essential (primary) hypertension: Secondary | ICD-10-CM | POA: Diagnosis not present

## 2017-11-21 DIAGNOSIS — Z299 Encounter for prophylactic measures, unspecified: Secondary | ICD-10-CM | POA: Diagnosis not present

## 2017-11-21 DIAGNOSIS — I4891 Unspecified atrial fibrillation: Secondary | ICD-10-CM | POA: Diagnosis not present

## 2017-11-22 ENCOUNTER — Ambulatory Visit (INDEPENDENT_AMBULATORY_CARE_PROVIDER_SITE_OTHER): Payer: Medicare Other

## 2017-11-22 ENCOUNTER — Other Ambulatory Visit: Payer: Self-pay

## 2017-11-22 DIAGNOSIS — I340 Nonrheumatic mitral (valve) insufficiency: Secondary | ICD-10-CM | POA: Diagnosis not present

## 2017-11-28 ENCOUNTER — Ambulatory Visit (INDEPENDENT_AMBULATORY_CARE_PROVIDER_SITE_OTHER): Payer: Medicare Other | Admitting: Urology

## 2017-11-28 DIAGNOSIS — N433 Hydrocele, unspecified: Secondary | ICD-10-CM

## 2017-11-29 ENCOUNTER — Telehealth: Payer: Self-pay | Admitting: *Deleted

## 2017-11-29 NOTE — Telephone Encounter (Signed)
Pt aware and voiced understanding - routed to pcp  

## 2017-11-29 NOTE — Telephone Encounter (Signed)
-----   Message from Arnoldo Lenis, MD sent at 11/28/2017 12:31 PM EST ----- Echo looks good. Heart valve remains just moderately leaky which is stable, we will continue to monitor  Zandra Abts MD

## 2017-11-30 DIAGNOSIS — I10 Essential (primary) hypertension: Secondary | ICD-10-CM | POA: Diagnosis not present

## 2017-11-30 DIAGNOSIS — I4891 Unspecified atrial fibrillation: Secondary | ICD-10-CM | POA: Diagnosis not present

## 2017-11-30 DIAGNOSIS — J449 Chronic obstructive pulmonary disease, unspecified: Secondary | ICD-10-CM | POA: Diagnosis not present

## 2017-11-30 DIAGNOSIS — Z6826 Body mass index (BMI) 26.0-26.9, adult: Secondary | ICD-10-CM | POA: Diagnosis not present

## 2017-11-30 DIAGNOSIS — E7800 Pure hypercholesterolemia, unspecified: Secondary | ICD-10-CM | POA: Diagnosis not present

## 2017-11-30 DIAGNOSIS — Z299 Encounter for prophylactic measures, unspecified: Secondary | ICD-10-CM | POA: Diagnosis not present

## 2017-12-12 ENCOUNTER — Ambulatory Visit (INDEPENDENT_AMBULATORY_CARE_PROVIDER_SITE_OTHER): Payer: Medicare Other | Admitting: Urology

## 2017-12-12 DIAGNOSIS — N433 Hydrocele, unspecified: Secondary | ICD-10-CM

## 2017-12-14 DIAGNOSIS — J449 Chronic obstructive pulmonary disease, unspecified: Secondary | ICD-10-CM | POA: Diagnosis not present

## 2017-12-14 DIAGNOSIS — E7800 Pure hypercholesterolemia, unspecified: Secondary | ICD-10-CM | POA: Diagnosis not present

## 2017-12-14 DIAGNOSIS — I4891 Unspecified atrial fibrillation: Secondary | ICD-10-CM | POA: Diagnosis not present

## 2017-12-14 DIAGNOSIS — Z6826 Body mass index (BMI) 26.0-26.9, adult: Secondary | ICD-10-CM | POA: Diagnosis not present

## 2017-12-14 DIAGNOSIS — Z299 Encounter for prophylactic measures, unspecified: Secondary | ICD-10-CM | POA: Diagnosis not present

## 2017-12-14 DIAGNOSIS — I10 Essential (primary) hypertension: Secondary | ICD-10-CM | POA: Diagnosis not present

## 2017-12-26 DIAGNOSIS — I4891 Unspecified atrial fibrillation: Secondary | ICD-10-CM | POA: Diagnosis not present

## 2017-12-26 DIAGNOSIS — J449 Chronic obstructive pulmonary disease, unspecified: Secondary | ICD-10-CM | POA: Diagnosis not present

## 2017-12-26 DIAGNOSIS — Z6826 Body mass index (BMI) 26.0-26.9, adult: Secondary | ICD-10-CM | POA: Diagnosis not present

## 2017-12-26 DIAGNOSIS — Z299 Encounter for prophylactic measures, unspecified: Secondary | ICD-10-CM | POA: Diagnosis not present

## 2017-12-26 DIAGNOSIS — I10 Essential (primary) hypertension: Secondary | ICD-10-CM | POA: Diagnosis not present

## 2017-12-26 DIAGNOSIS — E7800 Pure hypercholesterolemia, unspecified: Secondary | ICD-10-CM | POA: Diagnosis not present

## 2017-12-31 ENCOUNTER — Ambulatory Visit (INDEPENDENT_AMBULATORY_CARE_PROVIDER_SITE_OTHER): Payer: Medicare Other | Admitting: Otolaryngology

## 2017-12-31 DIAGNOSIS — H7011 Chronic mastoiditis, right ear: Secondary | ICD-10-CM

## 2018-01-09 ENCOUNTER — Ambulatory Visit (INDEPENDENT_AMBULATORY_CARE_PROVIDER_SITE_OTHER): Payer: Medicare Other | Admitting: Urology

## 2018-01-09 DIAGNOSIS — N433 Hydrocele, unspecified: Secondary | ICD-10-CM

## 2018-01-18 DIAGNOSIS — Z1339 Encounter for screening examination for other mental health and behavioral disorders: Secondary | ICD-10-CM | POA: Diagnosis not present

## 2018-01-18 DIAGNOSIS — R5383 Other fatigue: Secondary | ICD-10-CM | POA: Diagnosis not present

## 2018-01-18 DIAGNOSIS — Z1331 Encounter for screening for depression: Secondary | ICD-10-CM | POA: Diagnosis not present

## 2018-01-18 DIAGNOSIS — J449 Chronic obstructive pulmonary disease, unspecified: Secondary | ICD-10-CM | POA: Diagnosis not present

## 2018-01-18 DIAGNOSIS — K219 Gastro-esophageal reflux disease without esophagitis: Secondary | ICD-10-CM | POA: Diagnosis not present

## 2018-01-18 DIAGNOSIS — Z0000 Encounter for general adult medical examination without abnormal findings: Secondary | ICD-10-CM | POA: Diagnosis not present

## 2018-01-18 DIAGNOSIS — Z299 Encounter for prophylactic measures, unspecified: Secondary | ICD-10-CM | POA: Diagnosis not present

## 2018-01-18 DIAGNOSIS — Z6825 Body mass index (BMI) 25.0-25.9, adult: Secondary | ICD-10-CM | POA: Diagnosis not present

## 2018-01-18 DIAGNOSIS — I10 Essential (primary) hypertension: Secondary | ICD-10-CM | POA: Diagnosis not present

## 2018-01-18 DIAGNOSIS — Z1211 Encounter for screening for malignant neoplasm of colon: Secondary | ICD-10-CM | POA: Diagnosis not present

## 2018-01-18 DIAGNOSIS — Z125 Encounter for screening for malignant neoplasm of prostate: Secondary | ICD-10-CM | POA: Diagnosis not present

## 2018-01-18 DIAGNOSIS — I4891 Unspecified atrial fibrillation: Secondary | ICD-10-CM | POA: Diagnosis not present

## 2018-01-18 DIAGNOSIS — Z79899 Other long term (current) drug therapy: Secondary | ICD-10-CM | POA: Diagnosis not present

## 2018-01-18 DIAGNOSIS — Z7189 Other specified counseling: Secondary | ICD-10-CM | POA: Diagnosis not present

## 2018-02-20 ENCOUNTER — Encounter: Payer: Self-pay | Admitting: *Deleted

## 2018-02-20 ENCOUNTER — Other Ambulatory Visit: Payer: Self-pay

## 2018-02-20 ENCOUNTER — Ambulatory Visit (INDEPENDENT_AMBULATORY_CARE_PROVIDER_SITE_OTHER): Payer: Medicare Other | Admitting: Cardiology

## 2018-02-20 VITALS — BP 103/71 | HR 72 | Ht 72.0 in | Wt 191.0 lb

## 2018-02-20 DIAGNOSIS — I10 Essential (primary) hypertension: Secondary | ICD-10-CM | POA: Diagnosis not present

## 2018-02-20 DIAGNOSIS — I38 Endocarditis, valve unspecified: Secondary | ICD-10-CM

## 2018-02-20 DIAGNOSIS — I4891 Unspecified atrial fibrillation: Secondary | ICD-10-CM | POA: Diagnosis not present

## 2018-02-20 DIAGNOSIS — R001 Bradycardia, unspecified: Secondary | ICD-10-CM
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
## 2018-02-20 NOTE — Patient Instructions (Signed)

## 2018-02-26 ENCOUNTER — Encounter: Payer: Self-pay | Admitting: Cardiology

## 2018-03-07 DIAGNOSIS — I10 Essential (primary) hypertension: Secondary | ICD-10-CM | POA: Diagnosis not present

## 2018-03-07 DIAGNOSIS — R195 Other fecal abnormalities: Secondary | ICD-10-CM | POA: Diagnosis not present

## 2018-03-07 DIAGNOSIS — Z79899 Other long term (current) drug therapy: Secondary | ICD-10-CM | POA: Diagnosis not present

## 2018-03-07 DIAGNOSIS — D128 Benign neoplasm of rectum: Secondary | ICD-10-CM | POA: Diagnosis not present

## 2018-03-07 DIAGNOSIS — K219 Gastro-esophageal reflux disease without esophagitis: Secondary | ICD-10-CM | POA: Diagnosis not present

## 2018-03-07 DIAGNOSIS — K635 Polyp of colon: Secondary | ICD-10-CM | POA: Diagnosis not present

## 2018-03-07 DIAGNOSIS — I4891 Unspecified atrial fibrillation: Secondary | ICD-10-CM | POA: Diagnosis not present

## 2018-03-07 DIAGNOSIS — Z7902 Long term (current) use of antithrombotics/antiplatelets: Secondary | ICD-10-CM | POA: Diagnosis not present

## 2018-03-07 DIAGNOSIS — F419 Anxiety disorder, unspecified: Secondary | ICD-10-CM | POA: Diagnosis not present

## 2018-03-07 DIAGNOSIS — Z87891 Personal history of nicotine dependence: Secondary | ICD-10-CM | POA: Diagnosis not present

## 2018-03-07 DIAGNOSIS — K621 Rectal polyp: Secondary | ICD-10-CM | POA: Diagnosis not present

## 2018-03-07 DIAGNOSIS — K5730 Diverticulosis of large intestine without perforation or abscess without bleeding: Secondary | ICD-10-CM | POA: Diagnosis not present

## 2018-03-07 DIAGNOSIS — J449 Chronic obstructive pulmonary disease, unspecified: Secondary | ICD-10-CM | POA: Diagnosis not present

## 2018-03-07 DIAGNOSIS — D125 Benign neoplasm of sigmoid colon: Secondary | ICD-10-CM | POA: Diagnosis not present

## 2018-03-07 DIAGNOSIS — M810 Age-related osteoporosis without current pathological fracture: Secondary | ICD-10-CM | POA: Diagnosis not present

## 2018-03-18 DIAGNOSIS — K635 Polyp of colon: Secondary | ICD-10-CM | POA: Diagnosis not present

## 2018-03-18 DIAGNOSIS — Z800 Family history of malignant neoplasm of digestive organs: Secondary | ICD-10-CM | POA: Diagnosis not present

## 2018-04-01 ENCOUNTER — Ambulatory Visit (INDEPENDENT_AMBULATORY_CARE_PROVIDER_SITE_OTHER): Payer: Medicare Other | Admitting: Otolaryngology

## 2018-04-01 DIAGNOSIS — H7011 Chronic mastoiditis, right ear: Secondary | ICD-10-CM

## 2018-04-24 DIAGNOSIS — I4891 Unspecified atrial fibrillation: Secondary | ICD-10-CM | POA: Diagnosis not present

## 2018-04-24 DIAGNOSIS — Z299 Encounter for prophylactic measures, unspecified: Secondary | ICD-10-CM | POA: Diagnosis not present

## 2018-04-24 DIAGNOSIS — E7800 Pure hypercholesterolemia, unspecified: Secondary | ICD-10-CM | POA: Diagnosis not present

## 2018-04-24 DIAGNOSIS — J449 Chronic obstructive pulmonary disease, unspecified: Secondary | ICD-10-CM | POA: Diagnosis not present

## 2018-04-24 DIAGNOSIS — I10 Essential (primary) hypertension: Secondary | ICD-10-CM | POA: Diagnosis not present

## 2018-04-24 DIAGNOSIS — Z6826 Body mass index (BMI) 26.0-26.9, adult: Secondary | ICD-10-CM | POA: Diagnosis not present

## 2018-07-01 ENCOUNTER — Ambulatory Visit (INDEPENDENT_AMBULATORY_CARE_PROVIDER_SITE_OTHER): Payer: Medicare Other | Admitting: Otolaryngology

## 2018-07-01 DIAGNOSIS — H7011 Chronic mastoiditis, right ear: Secondary | ICD-10-CM | POA: Diagnosis not present

## 2018-07-24 DIAGNOSIS — Z299 Encounter for prophylactic measures, unspecified: Secondary | ICD-10-CM | POA: Diagnosis not present

## 2018-07-24 DIAGNOSIS — L820 Inflamed seborrheic keratosis: Secondary | ICD-10-CM | POA: Diagnosis not present

## 2018-07-24 DIAGNOSIS — Z6826 Body mass index (BMI) 26.0-26.9, adult: Secondary | ICD-10-CM | POA: Diagnosis not present

## 2018-07-24 DIAGNOSIS — I10 Essential (primary) hypertension: Secondary | ICD-10-CM | POA: Diagnosis not present

## 2018-07-24 DIAGNOSIS — L821 Other seborrheic keratosis: Secondary | ICD-10-CM | POA: Diagnosis not present

## 2018-08-02 DIAGNOSIS — I4891 Unspecified atrial fibrillation: Secondary | ICD-10-CM | POA: Diagnosis not present

## 2018-08-02 DIAGNOSIS — Z2821 Immunization not carried out because of patient refusal: Secondary | ICD-10-CM | POA: Diagnosis not present

## 2018-08-02 DIAGNOSIS — I10 Essential (primary) hypertension: Secondary | ICD-10-CM | POA: Diagnosis not present

## 2018-08-02 DIAGNOSIS — J449 Chronic obstructive pulmonary disease, unspecified: Secondary | ICD-10-CM | POA: Diagnosis not present

## 2018-08-02 DIAGNOSIS — Z299 Encounter for prophylactic measures, unspecified: Secondary | ICD-10-CM | POA: Diagnosis not present

## 2018-08-02 DIAGNOSIS — H6691 Otitis media, unspecified, right ear: Secondary | ICD-10-CM | POA: Diagnosis not present

## 2018-08-02 DIAGNOSIS — J019 Acute sinusitis, unspecified: Secondary | ICD-10-CM | POA: Diagnosis not present

## 2018-08-02 DIAGNOSIS — Z6826 Body mass index (BMI) 26.0-26.9, adult: Secondary | ICD-10-CM | POA: Diagnosis not present

## 2018-08-19 ENCOUNTER — Encounter: Payer: Self-pay | Admitting: *Deleted

## 2018-08-20 ENCOUNTER — Encounter: Payer: Self-pay | Admitting: *Deleted

## 2018-08-20 ENCOUNTER — Ambulatory Visit (INDEPENDENT_AMBULATORY_CARE_PROVIDER_SITE_OTHER): Payer: Medicare Other | Admitting: Cardiology

## 2018-08-20 VITALS — BP 112/74 | HR 75 | Ht 72.0 in | Wt 194.6 lb

## 2018-08-20 DIAGNOSIS — I38 Endocarditis, valve unspecified: Secondary | ICD-10-CM

## 2018-08-20 DIAGNOSIS — I4891 Unspecified atrial fibrillation: Secondary | ICD-10-CM | POA: Diagnosis not present

## 2018-08-20 DIAGNOSIS — I10 Essential (primary) hypertension: Secondary | ICD-10-CM | POA: Diagnosis not present

## 2018-08-20 NOTE — Patient Instructions (Signed)
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
## 2018-08-22 ENCOUNTER — Encounter: Payer: Self-pay | Admitting: *Deleted

## 2018-08-23 DIAGNOSIS — M810 Age-related osteoporosis without current pathological fracture: Secondary | ICD-10-CM | POA: Diagnosis not present

## 2018-08-27 DIAGNOSIS — Z6826 Body mass index (BMI) 26.0-26.9, adult: Secondary | ICD-10-CM | POA: Diagnosis not present

## 2018-08-27 DIAGNOSIS — I10 Essential (primary) hypertension: Secondary | ICD-10-CM | POA: Diagnosis not present

## 2018-08-27 DIAGNOSIS — Z299 Encounter for prophylactic measures, unspecified: Secondary | ICD-10-CM | POA: Diagnosis not present

## 2018-08-27 DIAGNOSIS — I4891 Unspecified atrial fibrillation: Secondary | ICD-10-CM | POA: Diagnosis not present

## 2018-08-27 DIAGNOSIS — J449 Chronic obstructive pulmonary disease, unspecified: Secondary | ICD-10-CM | POA: Diagnosis not present

## 2018-08-27 DIAGNOSIS — M810 Age-related osteoporosis without current pathological fracture: Secondary | ICD-10-CM | POA: Diagnosis not present

## 2018-09-26 ENCOUNTER — Ambulatory Visit (INDEPENDENT_AMBULATORY_CARE_PROVIDER_SITE_OTHER): Payer: Medicare Other | Admitting: Otolaryngology

## 2018-09-26 DIAGNOSIS — H95121 Granulation of postmastoidectomy cavity, right ear: Secondary | ICD-10-CM

## 2018-10-18 DIAGNOSIS — H35033 Hypertensive retinopathy, bilateral: Secondary | ICD-10-CM | POA: Diagnosis not present

## 2018-10-18 DIAGNOSIS — H40013 Open angle with borderline findings, low risk, bilateral: Secondary | ICD-10-CM | POA: Diagnosis not present

## 2018-10-18 DIAGNOSIS — H35372 Puckering of macula, left eye: Secondary | ICD-10-CM | POA: Diagnosis not present

## 2018-10-18 DIAGNOSIS — H35363 Drusen (degenerative) of macula, bilateral: Secondary | ICD-10-CM | POA: Diagnosis not present

## 2018-12-13 DIAGNOSIS — Z299 Encounter for prophylactic measures, unspecified: Secondary | ICD-10-CM | POA: Diagnosis not present

## 2018-12-13 DIAGNOSIS — Z6827 Body mass index (BMI) 27.0-27.9, adult: Secondary | ICD-10-CM | POA: Diagnosis not present

## 2018-12-13 DIAGNOSIS — I4891 Unspecified atrial fibrillation: Secondary | ICD-10-CM | POA: Diagnosis not present

## 2018-12-13 DIAGNOSIS — Z87891 Personal history of nicotine dependence: Secondary | ICD-10-CM | POA: Diagnosis not present

## 2018-12-13 DIAGNOSIS — I10 Essential (primary) hypertension: Secondary | ICD-10-CM | POA: Diagnosis not present

## 2018-12-13 DIAGNOSIS — M25519 Pain in unspecified shoulder: Secondary | ICD-10-CM | POA: Diagnosis not present

## 2018-12-13 DIAGNOSIS — J449 Chronic obstructive pulmonary disease, unspecified: Secondary | ICD-10-CM | POA: Diagnosis not present

## 2018-12-16 ENCOUNTER — Ambulatory Visit (INDEPENDENT_AMBULATORY_CARE_PROVIDER_SITE_OTHER): Payer: Medicare Other | Admitting: Otolaryngology

## 2018-12-16 DIAGNOSIS — H7012 Chronic mastoiditis, left ear: Secondary | ICD-10-CM | POA: Diagnosis not present

## 2018-12-24 DIAGNOSIS — J449 Chronic obstructive pulmonary disease, unspecified: Secondary | ICD-10-CM | POA: Diagnosis not present

## 2018-12-24 DIAGNOSIS — J029 Acute pharyngitis, unspecified: Secondary | ICD-10-CM | POA: Diagnosis not present

## 2018-12-24 DIAGNOSIS — Z87891 Personal history of nicotine dependence: Secondary | ICD-10-CM | POA: Diagnosis not present

## 2018-12-30 DIAGNOSIS — Z299 Encounter for prophylactic measures, unspecified: Secondary | ICD-10-CM | POA: Diagnosis not present

## 2018-12-30 DIAGNOSIS — Z87891 Personal history of nicotine dependence: Secondary | ICD-10-CM | POA: Diagnosis not present

## 2018-12-30 DIAGNOSIS — M25519 Pain in unspecified shoulder: Secondary | ICD-10-CM | POA: Diagnosis not present

## 2018-12-30 DIAGNOSIS — J029 Acute pharyngitis, unspecified: Secondary | ICD-10-CM | POA: Diagnosis not present

## 2018-12-30 DIAGNOSIS — I10 Essential (primary) hypertension: Secondary | ICD-10-CM | POA: Diagnosis not present

## 2018-12-30 DIAGNOSIS — M810 Age-related osteoporosis without current pathological fracture: Secondary | ICD-10-CM | POA: Diagnosis not present

## 2018-12-30 DIAGNOSIS — Z6827 Body mass index (BMI) 27.0-27.9, adult: Secondary | ICD-10-CM | POA: Diagnosis not present

## 2019-01-28 DIAGNOSIS — E7800 Pure hypercholesterolemia, unspecified: Secondary | ICD-10-CM | POA: Diagnosis not present

## 2019-01-28 DIAGNOSIS — Z7189 Other specified counseling: Secondary | ICD-10-CM | POA: Diagnosis not present

## 2019-01-28 DIAGNOSIS — Z299 Encounter for prophylactic measures, unspecified: Secondary | ICD-10-CM | POA: Diagnosis not present

## 2019-01-28 DIAGNOSIS — Z1339 Encounter for screening examination for other mental health and behavioral disorders: Secondary | ICD-10-CM | POA: Diagnosis not present

## 2019-01-28 DIAGNOSIS — Z0000 Encounter for general adult medical examination without abnormal findings: Secondary | ICD-10-CM | POA: Diagnosis not present

## 2019-01-28 DIAGNOSIS — Z6827 Body mass index (BMI) 27.0-27.9, adult: Secondary | ICD-10-CM | POA: Diagnosis not present

## 2019-01-28 DIAGNOSIS — R5383 Other fatigue: Secondary | ICD-10-CM | POA: Diagnosis not present

## 2019-01-28 DIAGNOSIS — Z79899 Other long term (current) drug therapy: Secondary | ICD-10-CM | POA: Diagnosis not present

## 2019-01-28 DIAGNOSIS — Z1211 Encounter for screening for malignant neoplasm of colon: Secondary | ICD-10-CM | POA: Diagnosis not present

## 2019-01-28 DIAGNOSIS — Z125 Encounter for screening for malignant neoplasm of prostate: Secondary | ICD-10-CM | POA: Diagnosis not present

## 2019-01-28 DIAGNOSIS — Z1331 Encounter for screening for depression: Secondary | ICD-10-CM | POA: Diagnosis not present

## 2019-01-28 DIAGNOSIS — I10 Essential (primary) hypertension: Secondary | ICD-10-CM | POA: Diagnosis not present

## 2019-03-06 DIAGNOSIS — Z6826 Body mass index (BMI) 26.0-26.9, adult: Secondary | ICD-10-CM | POA: Diagnosis not present

## 2019-03-06 DIAGNOSIS — R609 Edema, unspecified: Secondary | ICD-10-CM | POA: Diagnosis not present

## 2019-03-06 DIAGNOSIS — J449 Chronic obstructive pulmonary disease, unspecified: Secondary | ICD-10-CM | POA: Diagnosis not present

## 2019-03-06 DIAGNOSIS — I10 Essential (primary) hypertension: Secondary | ICD-10-CM | POA: Diagnosis not present

## 2019-03-06 DIAGNOSIS — I4891 Unspecified atrial fibrillation: Secondary | ICD-10-CM | POA: Diagnosis not present

## 2019-03-06 DIAGNOSIS — Z299 Encounter for prophylactic measures, unspecified: Secondary | ICD-10-CM | POA: Diagnosis not present

## 2019-03-07 DIAGNOSIS — I10 Essential (primary) hypertension: Secondary | ICD-10-CM | POA: Diagnosis not present

## 2019-03-11 DIAGNOSIS — Z6826 Body mass index (BMI) 26.0-26.9, adult: Secondary | ICD-10-CM | POA: Diagnosis not present

## 2019-03-11 DIAGNOSIS — Z299 Encounter for prophylactic measures, unspecified: Secondary | ICD-10-CM | POA: Diagnosis not present

## 2019-03-11 DIAGNOSIS — J449 Chronic obstructive pulmonary disease, unspecified: Secondary | ICD-10-CM | POA: Diagnosis not present

## 2019-03-11 DIAGNOSIS — I4891 Unspecified atrial fibrillation: Secondary | ICD-10-CM | POA: Diagnosis not present

## 2019-03-11 DIAGNOSIS — I10 Essential (primary) hypertension: Secondary | ICD-10-CM | POA: Diagnosis not present

## 2019-03-31 ENCOUNTER — Ambulatory Visit (INDEPENDENT_AMBULATORY_CARE_PROVIDER_SITE_OTHER): Payer: Medicare Other | Admitting: Otolaryngology

## 2019-03-31 DIAGNOSIS — H7011 Chronic mastoiditis, right ear: Secondary | ICD-10-CM | POA: Diagnosis not present

## 2019-03-31 DIAGNOSIS — I10 Essential (primary) hypertension: Secondary | ICD-10-CM | POA: Diagnosis not present

## 2019-04-08 DIAGNOSIS — R42 Dizziness and giddiness: Secondary | ICD-10-CM | POA: Diagnosis not present

## 2019-04-08 DIAGNOSIS — I10 Essential (primary) hypertension: Secondary | ICD-10-CM | POA: Diagnosis not present

## 2019-04-08 DIAGNOSIS — J449 Chronic obstructive pulmonary disease, unspecified: Secondary | ICD-10-CM | POA: Diagnosis not present

## 2019-04-08 DIAGNOSIS — I4891 Unspecified atrial fibrillation: Secondary | ICD-10-CM | POA: Diagnosis not present

## 2019-04-08 DIAGNOSIS — Z299 Encounter for prophylactic measures, unspecified: Secondary | ICD-10-CM | POA: Diagnosis not present

## 2019-04-08 DIAGNOSIS — Z6826 Body mass index (BMI) 26.0-26.9, adult: Secondary | ICD-10-CM | POA: Diagnosis not present

## 2019-04-22 DIAGNOSIS — H40013 Open angle with borderline findings, low risk, bilateral: Secondary | ICD-10-CM | POA: Diagnosis not present

## 2019-04-22 DIAGNOSIS — H16223 Keratoconjunctivitis sicca, not specified as Sjogren's, bilateral: Secondary | ICD-10-CM | POA: Diagnosis not present

## 2019-05-07 DIAGNOSIS — I10 Essential (primary) hypertension: Secondary | ICD-10-CM | POA: Diagnosis not present

## 2019-06-05 DIAGNOSIS — I10 Essential (primary) hypertension: Secondary | ICD-10-CM | POA: Diagnosis not present

## 2019-06-09 DIAGNOSIS — J449 Chronic obstructive pulmonary disease, unspecified: Secondary | ICD-10-CM | POA: Diagnosis not present

## 2019-06-09 DIAGNOSIS — Z299 Encounter for prophylactic measures, unspecified: Secondary | ICD-10-CM | POA: Diagnosis not present

## 2019-06-09 DIAGNOSIS — I4891 Unspecified atrial fibrillation: Secondary | ICD-10-CM | POA: Diagnosis not present

## 2019-06-09 DIAGNOSIS — I10 Essential (primary) hypertension: Secondary | ICD-10-CM | POA: Diagnosis not present

## 2019-06-09 DIAGNOSIS — Z6826 Body mass index (BMI) 26.0-26.9, adult: Secondary | ICD-10-CM | POA: Diagnosis not present

## 2019-06-24 DIAGNOSIS — J449 Chronic obstructive pulmonary disease, unspecified: Secondary | ICD-10-CM | POA: Diagnosis not present

## 2019-06-24 DIAGNOSIS — I10 Essential (primary) hypertension: Secondary | ICD-10-CM | POA: Diagnosis not present

## 2019-06-24 DIAGNOSIS — I4891 Unspecified atrial fibrillation: Secondary | ICD-10-CM | POA: Diagnosis not present

## 2019-07-03 ENCOUNTER — Ambulatory Visit (INDEPENDENT_AMBULATORY_CARE_PROVIDER_SITE_OTHER): Payer: Medicare Other | Admitting: Otolaryngology

## 2019-07-03 DIAGNOSIS — H7011 Chronic mastoiditis, right ear: Secondary | ICD-10-CM | POA: Diagnosis not present

## 2019-07-21 DIAGNOSIS — I4891 Unspecified atrial fibrillation: Secondary | ICD-10-CM | POA: Diagnosis not present

## 2019-07-21 DIAGNOSIS — J449 Chronic obstructive pulmonary disease, unspecified: Secondary | ICD-10-CM | POA: Diagnosis not present

## 2019-07-21 DIAGNOSIS — I10 Essential (primary) hypertension: Secondary | ICD-10-CM | POA: Diagnosis not present

## 2019-09-09 DIAGNOSIS — I10 Essential (primary) hypertension: Secondary | ICD-10-CM | POA: Diagnosis not present

## 2019-09-09 DIAGNOSIS — Z6826 Body mass index (BMI) 26.0-26.9, adult: Secondary | ICD-10-CM | POA: Diagnosis not present

## 2019-09-09 DIAGNOSIS — Z299 Encounter for prophylactic measures, unspecified: Secondary | ICD-10-CM | POA: Diagnosis not present

## 2019-09-09 DIAGNOSIS — J449 Chronic obstructive pulmonary disease, unspecified: Secondary | ICD-10-CM | POA: Diagnosis not present

## 2019-09-09 DIAGNOSIS — I4891 Unspecified atrial fibrillation: Secondary | ICD-10-CM | POA: Diagnosis not present

## 2019-09-09 DIAGNOSIS — D692 Other nonthrombocytopenic purpura: Secondary | ICD-10-CM | POA: Diagnosis not present

## 2019-09-22 DIAGNOSIS — I4891 Unspecified atrial fibrillation: Secondary | ICD-10-CM | POA: Diagnosis not present

## 2019-09-22 DIAGNOSIS — J449 Chronic obstructive pulmonary disease, unspecified: Secondary | ICD-10-CM | POA: Diagnosis not present

## 2019-09-22 DIAGNOSIS — I10 Essential (primary) hypertension: Secondary | ICD-10-CM | POA: Diagnosis not present

## 2019-10-20 DIAGNOSIS — I4891 Unspecified atrial fibrillation: Secondary | ICD-10-CM | POA: Diagnosis not present

## 2019-10-20 DIAGNOSIS — J449 Chronic obstructive pulmonary disease, unspecified: Secondary | ICD-10-CM | POA: Diagnosis not present

## 2019-10-20 DIAGNOSIS — I10 Essential (primary) hypertension: Secondary | ICD-10-CM | POA: Diagnosis not present

## 2019-10-28 DIAGNOSIS — H35033 Hypertensive retinopathy, bilateral: Secondary | ICD-10-CM | POA: Diagnosis not present

## 2019-10-28 DIAGNOSIS — H40013 Open angle with borderline findings, low risk, bilateral: Secondary | ICD-10-CM | POA: Diagnosis not present

## 2019-10-28 DIAGNOSIS — H35363 Drusen (degenerative) of macula, bilateral: Secondary | ICD-10-CM | POA: Diagnosis not present

## 2019-10-28 DIAGNOSIS — H35373 Puckering of macula, bilateral: Secondary | ICD-10-CM | POA: Diagnosis not present

## 2019-11-06 DIAGNOSIS — H7011 Chronic mastoiditis, right ear: Secondary | ICD-10-CM | POA: Diagnosis not present

## 2019-11-15 ENCOUNTER — Ambulatory Visit: Payer: Medicare Other | Attending: Internal Medicine

## 2019-11-15 DIAGNOSIS — Z23 Encounter for immunization: Secondary | ICD-10-CM | POA: Insufficient documentation

## 2019-11-15 NOTE — Progress Notes (Signed)
   Covid-19 Vaccination Clinic  Name:  Kenneth [name]    MRN: [id number] DOB: 12/16/1950  11/15/2019  [name] was observed post Covid-19 immunization for 30 minutes based on pre-vaccination screening without incidence. He was provided with Vaccine Information Sheet and instruction to access the V-Safe system.   [name] was instructed to call 911 with any severe reactions post vaccine: [name] Kitchen Difficulty breathing  . Swelling of your face and throat  . A fast heartbeat  . A bad rash all over your body  . Dizziness and weakness    Immunizations Administered    Name Date Dose VIS Date Route   Pfizer COVID-19 Vaccine 11/15/2019  9:42 AM 0.3 mL 09/19/2019 Intramuscular   Manufacturer: [place]   Lot: [id number]   [place]: [id number]

## 2019-11-19 DIAGNOSIS — H02831 Dermatochalasis of right upper eyelid: Secondary | ICD-10-CM | POA: Diagnosis not present

## 2019-11-19 DIAGNOSIS — H02413 Mechanical ptosis of bilateral eyelids: Secondary | ICD-10-CM | POA: Diagnosis not present

## 2019-11-19 DIAGNOSIS — H02834 Dermatochalasis of left upper eyelid: Secondary | ICD-10-CM | POA: Diagnosis not present

## 2019-11-19 DIAGNOSIS — H57813 Brow ptosis, bilateral: Secondary | ICD-10-CM | POA: Diagnosis not present

## 2019-11-19 DIAGNOSIS — H0279 Other degenerative disorders of eyelid and periocular area: Secondary | ICD-10-CM | POA: Diagnosis not present

## 2019-11-19 DIAGNOSIS — H02423 Myogenic ptosis of bilateral eyelids: Secondary | ICD-10-CM | POA: Diagnosis not present

## 2019-11-19 DIAGNOSIS — H53483 Generalized contraction of visual field, bilateral: Secondary | ICD-10-CM | POA: Diagnosis not present

## 2019-11-20 DIAGNOSIS — I4891 Unspecified atrial fibrillation: Secondary | ICD-10-CM | POA: Diagnosis not present

## 2019-11-20 DIAGNOSIS — J449 Chronic obstructive pulmonary disease, unspecified: Secondary | ICD-10-CM | POA: Diagnosis not present

## 2019-11-20 DIAGNOSIS — I10 Essential (primary) hypertension: Secondary | ICD-10-CM | POA: Diagnosis not present

## 2019-11-21 DIAGNOSIS — I4891 Unspecified atrial fibrillation: Secondary | ICD-10-CM | POA: Diagnosis not present

## 2019-11-21 DIAGNOSIS — R361 Hematospermia: Secondary | ICD-10-CM | POA: Diagnosis not present

## 2019-11-21 DIAGNOSIS — Z6826 Body mass index (BMI) 26.0-26.9, adult: Secondary | ICD-10-CM | POA: Diagnosis not present

## 2019-11-21 DIAGNOSIS — Z299 Encounter for prophylactic measures, unspecified: Secondary | ICD-10-CM | POA: Diagnosis not present

## 2019-11-21 DIAGNOSIS — J449 Chronic obstructive pulmonary disease, unspecified: Secondary | ICD-10-CM | POA: Diagnosis not present

## 2019-11-21 DIAGNOSIS — I10 Essential (primary) hypertension: Secondary | ICD-10-CM | POA: Diagnosis not present

## 2019-11-24 DIAGNOSIS — H53481 Generalized contraction of visual field, right eye: Secondary | ICD-10-CM | POA: Diagnosis not present

## 2019-11-24 DIAGNOSIS — H53482 Generalized contraction of visual field, left eye: Secondary | ICD-10-CM | POA: Diagnosis not present

## 2019-11-24 DIAGNOSIS — H53483 Generalized contraction of visual field, bilateral: Secondary | ICD-10-CM | POA: Diagnosis not present

## 2019-11-28 ENCOUNTER — Encounter: Payer: Self-pay | Admitting: *Deleted

## 2019-11-28 ENCOUNTER — Other Ambulatory Visit: Payer: Self-pay

## 2019-11-28 ENCOUNTER — Ambulatory Visit (INDEPENDENT_AMBULATORY_CARE_PROVIDER_SITE_OTHER): Payer: Medicare Other | Admitting: Cardiology

## 2019-11-28 VITALS — BP 105/73 | HR 85 | Ht 72.0 in | Wt 195.0 lb

## 2019-11-28 DIAGNOSIS — I38 Endocarditis, valve unspecified: Secondary | ICD-10-CM

## 2019-11-28 DIAGNOSIS — I4891 Unspecified atrial fibrillation: Secondary | ICD-10-CM

## 2019-11-28 NOTE — Patient Instructions (Addendum)
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
## 2019-12-09 ENCOUNTER — Ambulatory Visit: Payer: Medicare Other | Attending: Internal Medicine

## 2019-12-09 DIAGNOSIS — Z23 Encounter for immunization: Secondary | ICD-10-CM

## 2019-12-09 NOTE — Progress Notes (Signed)
   Covid-19 Vaccination Clinic  Name:  Kenneth [name]    MRN: [id number] DOB: 06/22/1951  12/09/2019  [name] was observed post Covid-19 immunization for 15 minutes without incident. He was provided with Vaccine Information Sheet and instruction to access the V-Safe system.   [name] was instructed to call 911 with any severe reactions post vaccine: [name] Kitchen Difficulty breathing  . Swelling of face and throat  . A fast heartbeat  . A bad rash all over body  . Dizziness and weakness   Immunizations Administered    Name Date Dose VIS Date Route   Pfizer COVID-19 Vaccine 12/09/2019  9:40 AM 0.3 mL 09/19/2019 Intramuscular   Manufacturer: [place]   Lot: [id number]   [place]: [id number]

## 2019-12-17 DIAGNOSIS — J449 Chronic obstructive pulmonary disease, unspecified: Secondary | ICD-10-CM | POA: Diagnosis not present

## 2019-12-17 DIAGNOSIS — I4891 Unspecified atrial fibrillation: Secondary | ICD-10-CM | POA: Diagnosis not present

## 2019-12-17 DIAGNOSIS — I10 Essential (primary) hypertension: Secondary | ICD-10-CM | POA: Diagnosis not present

## 2020-01-14 DIAGNOSIS — H02831 Dermatochalasis of right upper eyelid: Secondary | ICD-10-CM | POA: Diagnosis not present

## 2020-01-14 DIAGNOSIS — H02834 Dermatochalasis of left upper eyelid: Secondary | ICD-10-CM | POA: Diagnosis not present

## 2020-01-14 DIAGNOSIS — H02413 Mechanical ptosis of bilateral eyelids: Secondary | ICD-10-CM | POA: Diagnosis not present

## 2020-01-14 DIAGNOSIS — H53483 Generalized contraction of visual field, bilateral: Secondary | ICD-10-CM | POA: Diagnosis not present

## 2020-01-14 DIAGNOSIS — H02423 Myogenic ptosis of bilateral eyelids: Secondary | ICD-10-CM | POA: Diagnosis not present

## 2020-01-14 DIAGNOSIS — H0279 Other degenerative disorders of eyelid and periocular area: Secondary | ICD-10-CM | POA: Diagnosis not present

## 2020-01-14 DIAGNOSIS — H57813 Brow ptosis, bilateral: Secondary | ICD-10-CM | POA: Diagnosis not present

## 2020-02-04 DIAGNOSIS — I10 Essential (primary) hypertension: Secondary | ICD-10-CM | POA: Diagnosis not present

## 2020-02-04 DIAGNOSIS — R5383 Other fatigue: Secondary | ICD-10-CM | POA: Diagnosis not present

## 2020-02-04 DIAGNOSIS — Z299 Encounter for prophylactic measures, unspecified: Secondary | ICD-10-CM | POA: Diagnosis not present

## 2020-02-04 DIAGNOSIS — Z125 Encounter for screening for malignant neoplasm of prostate: Secondary | ICD-10-CM | POA: Diagnosis not present

## 2020-02-04 DIAGNOSIS — I4891 Unspecified atrial fibrillation: Secondary | ICD-10-CM | POA: Diagnosis not present

## 2020-02-04 DIAGNOSIS — Z7189 Other specified counseling: Secondary | ICD-10-CM | POA: Diagnosis not present

## 2020-02-04 DIAGNOSIS — E7800 Pure hypercholesterolemia, unspecified: Secondary | ICD-10-CM | POA: Diagnosis not present

## 2020-02-04 DIAGNOSIS — Z1331 Encounter for screening for depression: Secondary | ICD-10-CM | POA: Diagnosis not present

## 2020-02-04 DIAGNOSIS — J449 Chronic obstructive pulmonary disease, unspecified: Secondary | ICD-10-CM | POA: Diagnosis not present

## 2020-02-04 DIAGNOSIS — Z1211 Encounter for screening for malignant neoplasm of colon: Secondary | ICD-10-CM | POA: Diagnosis not present

## 2020-02-04 DIAGNOSIS — Z1339 Encounter for screening examination for other mental health and behavioral disorders: Secondary | ICD-10-CM | POA: Diagnosis not present

## 2020-02-04 DIAGNOSIS — Z0000 Encounter for general adult medical examination without abnormal findings: Secondary | ICD-10-CM | POA: Diagnosis not present

## 2020-02-04 DIAGNOSIS — Z79899 Other long term (current) drug therapy: Secondary | ICD-10-CM | POA: Diagnosis not present

## 2020-02-06 DIAGNOSIS — I10 Essential (primary) hypertension: Secondary | ICD-10-CM | POA: Diagnosis not present

## 2020-02-06 DIAGNOSIS — I4891 Unspecified atrial fibrillation: Secondary | ICD-10-CM | POA: Diagnosis not present

## 2020-02-06 DIAGNOSIS — J449 Chronic obstructive pulmonary disease, unspecified: Secondary | ICD-10-CM | POA: Diagnosis not present

## 2020-03-07 DIAGNOSIS — I4891 Unspecified atrial fibrillation: Secondary | ICD-10-CM | POA: Diagnosis not present

## 2020-03-07 DIAGNOSIS — J449 Chronic obstructive pulmonary disease, unspecified: Secondary | ICD-10-CM | POA: Diagnosis not present

## 2020-03-07 DIAGNOSIS — I10 Essential (primary) hypertension: Secondary | ICD-10-CM | POA: Diagnosis not present

## 2020-03-17 DIAGNOSIS — H95121 Granulation of postmastoidectomy cavity, right ear: Secondary | ICD-10-CM | POA: Diagnosis not present

## 2020-04-07 DIAGNOSIS — J449 Chronic obstructive pulmonary disease, unspecified: Secondary | ICD-10-CM | POA: Diagnosis not present

## 2020-04-07 DIAGNOSIS — I10 Essential (primary) hypertension: Secondary | ICD-10-CM | POA: Diagnosis not present

## 2020-04-07 DIAGNOSIS — I4891 Unspecified atrial fibrillation: Secondary | ICD-10-CM | POA: Diagnosis not present

## 2020-05-05 DIAGNOSIS — H40013 Open angle with borderline findings, low risk, bilateral: Secondary | ICD-10-CM | POA: Diagnosis not present

## 2020-05-10 DIAGNOSIS — I4891 Unspecified atrial fibrillation: Secondary | ICD-10-CM | POA: Diagnosis not present

## 2020-05-10 DIAGNOSIS — J449 Chronic obstructive pulmonary disease, unspecified: Secondary | ICD-10-CM | POA: Diagnosis not present

## 2020-05-10 DIAGNOSIS — I10 Essential (primary) hypertension: Secondary | ICD-10-CM | POA: Diagnosis not present

## 2020-05-11 DIAGNOSIS — J449 Chronic obstructive pulmonary disease, unspecified: Secondary | ICD-10-CM | POA: Diagnosis not present

## 2020-05-11 DIAGNOSIS — D696 Thrombocytopenia, unspecified: Secondary | ICD-10-CM | POA: Diagnosis not present

## 2020-05-11 DIAGNOSIS — I4891 Unspecified atrial fibrillation: Secondary | ICD-10-CM | POA: Diagnosis not present

## 2020-05-11 DIAGNOSIS — E7800 Pure hypercholesterolemia, unspecified: Secondary | ICD-10-CM | POA: Diagnosis not present

## 2020-05-11 DIAGNOSIS — Z299 Encounter for prophylactic measures, unspecified: Secondary | ICD-10-CM | POA: Diagnosis not present

## 2020-05-11 DIAGNOSIS — I10 Essential (primary) hypertension: Secondary | ICD-10-CM | POA: Diagnosis not present

## 2020-05-25 DIAGNOSIS — T148XXA Other injury of unspecified body region, initial encounter: Secondary | ICD-10-CM | POA: Diagnosis not present

## 2020-05-25 DIAGNOSIS — Z299 Encounter for prophylactic measures, unspecified: Secondary | ICD-10-CM | POA: Diagnosis not present

## 2020-05-25 DIAGNOSIS — I4891 Unspecified atrial fibrillation: Secondary | ICD-10-CM | POA: Diagnosis not present

## 2020-05-25 DIAGNOSIS — D696 Thrombocytopenia, unspecified: Secondary | ICD-10-CM | POA: Diagnosis not present

## 2020-05-25 DIAGNOSIS — I10 Essential (primary) hypertension: Secondary | ICD-10-CM | POA: Diagnosis not present

## 2020-05-25 DIAGNOSIS — J449 Chronic obstructive pulmonary disease, unspecified: Secondary | ICD-10-CM | POA: Diagnosis not present

## 2020-06-25 DIAGNOSIS — R42 Dizziness and giddiness: Secondary | ICD-10-CM | POA: Diagnosis not present

## 2020-06-25 DIAGNOSIS — H6690 Otitis media, unspecified, unspecified ear: Secondary | ICD-10-CM | POA: Diagnosis not present

## 2020-06-25 DIAGNOSIS — Z299 Encounter for prophylactic measures, unspecified: Secondary | ICD-10-CM | POA: Diagnosis not present

## 2020-07-06 DIAGNOSIS — Z23 Encounter for immunization: Secondary | ICD-10-CM | POA: Diagnosis not present

## 2020-07-08 DIAGNOSIS — I10 Essential (primary) hypertension: Secondary | ICD-10-CM | POA: Diagnosis not present

## 2020-07-08 DIAGNOSIS — J449 Chronic obstructive pulmonary disease, unspecified: Secondary | ICD-10-CM | POA: Diagnosis not present

## 2020-07-08 DIAGNOSIS — I4891 Unspecified atrial fibrillation: Secondary | ICD-10-CM | POA: Diagnosis not present

## 2020-07-14 DIAGNOSIS — H7011 Chronic mastoiditis, right ear: Secondary | ICD-10-CM | POA: Diagnosis not present

## 2020-08-06 DIAGNOSIS — I10 Essential (primary) hypertension: Secondary | ICD-10-CM | POA: Diagnosis not present

## 2020-08-06 DIAGNOSIS — I4891 Unspecified atrial fibrillation: Secondary | ICD-10-CM | POA: Diagnosis not present

## 2020-08-06 DIAGNOSIS — J449 Chronic obstructive pulmonary disease, unspecified: Secondary | ICD-10-CM | POA: Diagnosis not present

## 2020-08-19 DIAGNOSIS — D696 Thrombocytopenia, unspecified: Secondary | ICD-10-CM | POA: Diagnosis not present

## 2020-08-19 DIAGNOSIS — I4891 Unspecified atrial fibrillation: Secondary | ICD-10-CM | POA: Diagnosis not present

## 2020-08-19 DIAGNOSIS — Z299 Encounter for prophylactic measures, unspecified: Secondary | ICD-10-CM | POA: Diagnosis not present

## 2020-08-19 DIAGNOSIS — I10 Essential (primary) hypertension: Secondary | ICD-10-CM | POA: Diagnosis not present

## 2020-08-19 DIAGNOSIS — J449 Chronic obstructive pulmonary disease, unspecified: Secondary | ICD-10-CM | POA: Diagnosis not present

## 2020-08-31 DIAGNOSIS — B1920 Unspecified viral hepatitis C without hepatic coma: Secondary | ICD-10-CM | POA: Diagnosis not present

## 2020-08-31 DIAGNOSIS — I4891 Unspecified atrial fibrillation: Secondary | ICD-10-CM | POA: Diagnosis not present

## 2020-08-31 DIAGNOSIS — D696 Thrombocytopenia, unspecified: Secondary | ICD-10-CM | POA: Diagnosis not present

## 2020-08-31 DIAGNOSIS — J449 Chronic obstructive pulmonary disease, unspecified: Secondary | ICD-10-CM | POA: Diagnosis not present

## 2020-08-31 DIAGNOSIS — I10 Essential (primary) hypertension: Secondary | ICD-10-CM | POA: Diagnosis not present

## 2020-08-31 DIAGNOSIS — Z299 Encounter for prophylactic measures, unspecified: Secondary | ICD-10-CM | POA: Diagnosis not present

## 2020-09-06 ENCOUNTER — Encounter (INDEPENDENT_AMBULATORY_CARE_PROVIDER_SITE_OTHER): Payer: Self-pay | Admitting: *Deleted

## 2020-09-07 DIAGNOSIS — J449 Chronic obstructive pulmonary disease, unspecified: Secondary | ICD-10-CM | POA: Diagnosis not present

## 2020-09-07 DIAGNOSIS — I10 Essential (primary) hypertension: Secondary | ICD-10-CM | POA: Diagnosis not present

## 2020-09-07 DIAGNOSIS — I4891 Unspecified atrial fibrillation: Secondary | ICD-10-CM | POA: Diagnosis not present

## 2020-10-07 DIAGNOSIS — I4891 Unspecified atrial fibrillation: Secondary | ICD-10-CM | POA: Diagnosis not present

## 2020-10-07 DIAGNOSIS — J449 Chronic obstructive pulmonary disease, unspecified: Secondary | ICD-10-CM | POA: Diagnosis not present

## 2020-10-07 DIAGNOSIS — I10 Essential (primary) hypertension: Secondary | ICD-10-CM | POA: Diagnosis not present

## 2020-11-04 DIAGNOSIS — H35033 Hypertensive retinopathy, bilateral: Secondary | ICD-10-CM | POA: Diagnosis not present

## 2020-11-04 DIAGNOSIS — H35373 Puckering of macula, bilateral: Secondary | ICD-10-CM | POA: Diagnosis not present

## 2020-11-04 DIAGNOSIS — H40013 Open angle with borderline findings, low risk, bilateral: Secondary | ICD-10-CM | POA: Diagnosis not present

## 2020-11-04 DIAGNOSIS — Z9889 Other specified postprocedural states: Secondary | ICD-10-CM | POA: Diagnosis not present

## 2020-11-04 DIAGNOSIS — H35363 Drusen (degenerative) of macula, bilateral: Secondary | ICD-10-CM | POA: Diagnosis not present

## 2020-11-17 DIAGNOSIS — H7011 Chronic mastoiditis, right ear: Secondary | ICD-10-CM | POA: Diagnosis not present

## 2020-12-01 DIAGNOSIS — D692 Other nonthrombocytopenic purpura: Secondary | ICD-10-CM | POA: Diagnosis not present

## 2020-12-01 DIAGNOSIS — D696 Thrombocytopenia, unspecified: Secondary | ICD-10-CM | POA: Diagnosis not present

## 2020-12-01 DIAGNOSIS — J449 Chronic obstructive pulmonary disease, unspecified: Secondary | ICD-10-CM | POA: Diagnosis not present

## 2020-12-01 DIAGNOSIS — I10 Essential (primary) hypertension: Secondary | ICD-10-CM | POA: Diagnosis not present

## 2020-12-01 DIAGNOSIS — I4891 Unspecified atrial fibrillation: Secondary | ICD-10-CM | POA: Diagnosis not present

## 2020-12-01 DIAGNOSIS — Z299 Encounter for prophylactic measures, unspecified: Secondary | ICD-10-CM | POA: Diagnosis not present

## 2020-12-07 DIAGNOSIS — Z8619 Personal history of other infectious and parasitic diseases: Secondary | ICD-10-CM

## 2020-12-07 HISTORY — DX: Personal history of other infectious and parasitic diseases: Z86.19

## 2020-12-09 ENCOUNTER — Ambulatory Visit (INDEPENDENT_AMBULATORY_CARE_PROVIDER_SITE_OTHER): Payer: Medicare Other | Admitting: Gastroenterology

## 2020-12-09 ENCOUNTER — Encounter (INDEPENDENT_AMBULATORY_CARE_PROVIDER_SITE_OTHER): Payer: Self-pay | Admitting: Gastroenterology

## 2020-12-09 ENCOUNTER — Other Ambulatory Visit: Payer: Self-pay

## 2020-12-09 VITALS — BP 110/84 | HR 68 | Temp 97.4°F | Ht 72.0 in | Wt 203.0 lb

## 2020-12-09 DIAGNOSIS — K738 Other chronic hepatitis, not elsewhere classified: Secondary | ICD-10-CM | POA: Diagnosis not present

## 2020-12-09 DIAGNOSIS — R7989 Other specified abnormal findings of blood chemistry: Secondary | ICD-10-CM | POA: Diagnosis not present

## 2020-12-09 DIAGNOSIS — R768 Other specified abnormal immunological findings in serum: Secondary | ICD-10-CM | POA: Insufficient documentation

## 2020-12-09 DIAGNOSIS — Z114 Encounter for screening for human immunodeficiency virus [HIV]: Secondary | ICD-10-CM

## 2020-12-09 DIAGNOSIS — Z7901 Long term (current) use of anticoagulants: Secondary | ICD-10-CM

## 2020-12-09 NOTE — Patient Instructions (Signed)
Perform blood workup Schedule liver elastography  
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
## 2020-12-14 ENCOUNTER — Encounter (INDEPENDENT_AMBULATORY_CARE_PROVIDER_SITE_OTHER): Payer: Self-pay

## 2020-12-14 ENCOUNTER — Ambulatory Visit (HOSPITAL_COMMUNITY)
Admission: RE | Admit: 2020-12-14 | Discharge: 2020-12-14 | Disposition: A | Discharge status: Home / Self Care | Payer: Medicare Other | Source: Ambulatory Visit | Attending: Gastroenterology | Admitting: Gastroenterology

## 2020-12-14 ENCOUNTER — Ambulatory Visit (HOSPITAL_COMMUNITY): Payer: Medicare Other

## 2020-12-14 ENCOUNTER — Other Ambulatory Visit: Payer: Self-pay

## 2020-12-14 DIAGNOSIS — R7989 Other specified abnormal findings of blood chemistry: Secondary | ICD-10-CM | POA: Diagnosis not present

## 2020-12-14 DIAGNOSIS — R768 Other specified abnormal immunological findings in serum: Secondary | ICD-10-CM | POA: Diagnosis not present

## 2020-12-14 DIAGNOSIS — K7460 Unspecified cirrhosis of liver: Secondary | ICD-10-CM | POA: Diagnosis not present

## 2020-12-14 LAB — HEPATITIS C RNA QUANTITATIVE
HCV Quantitative Log: 6.33 log IU/mL — ABNORMAL HIGH
HCV RNA, PCR, QN: 2130000 IU/mL — ABNORMAL HIGH

## 2020-12-14 LAB — ANTI-NUCLEAR AB-TITER (ANA TITER): ANA Titer 1: 1:80 {titer} — ABNORMAL HIGH

## 2020-12-14 LAB — COMPREHENSIVE METABOLIC PANEL
AG Ratio: 1.4 (calc) (ref 1.0–2.5)
ALT: 34 U/L (ref 9–46)
AST: 36 U/L — ABNORMAL HIGH (ref 10–35)
Albumin: 4 g/dL (ref 3.6–5.1)
Alkaline phosphatase (APISO): 59 U/L (ref 35–144)
BUN: 13 mg/dL (ref 7–25)
CO2: 31 mmol/L (ref 20–32)
Calcium: 9.3 mg/dL (ref 8.6–10.3)
Chloride: 107 mmol/L (ref 98–110)
Creat: 0.82 mg/dL (ref 0.70–1.18)
Globulin: 2.9 g/dL (calc) (ref 1.9–3.7)
Glucose, Bld: 79 mg/dL (ref 65–139)
Potassium: 4.8 mmol/L (ref 3.5–5.3)
Sodium: 145 mmol/L (ref 135–146)
Total Bilirubin: 0.9 mg/dL (ref 0.2–1.2)
Total Protein: 6.9 g/dL (ref 6.1–8.1)

## 2020-12-14 LAB — CBC WITH DIFFERENTIAL/PLATELET
Absolute Monocytes: 502 cells/uL (ref 200–950)
Basophils Absolute: 9 cells/uL (ref 0–200)
Basophils Relative: 0.2 %
Eosinophils Absolute: 132 cells/uL (ref 15–500)
Eosinophils Relative: 3 %
HCT: 47.2 % (ref 38.5–50.0)
Hemoglobin: 15.9 g/dL (ref 13.2–17.1)
Lymphs Abs: 1575 cells/uL (ref 850–3900)
MCH: 30.8 pg (ref 27.0–33.0)
MCHC: 33.7 g/dL (ref 32.0–36.0)
MCV: 91.5 fL (ref 80.0–100.0)
MPV: 13 fL — ABNORMAL HIGH (ref 7.5–12.5)
Monocytes Relative: 11.4 %
Neutro Abs: 2182 cells/uL (ref 1500–7800)
Neutrophils Relative %: 49.6 %
Platelets: 130 10*3/uL — ABNORMAL LOW (ref 140–400)
RBC: 5.16 10*6/uL (ref 4.20–5.80)
RDW: 13 % (ref 11.0–15.0)
Total Lymphocyte: 35.8 %
WBC: 4.4 10*3/uL (ref 3.8–10.8)

## 2020-12-14 LAB — PROTIME-INR
INR: 1
Prothrombin Time: 10.2 s (ref 9.0–11.5)

## 2020-12-14 LAB — HEPATITIS C GENOTYPE

## 2020-12-14 LAB — TEST AUTHORIZATION

## 2020-12-14 LAB — ANA: Anti Nuclear Antibody (ANA): POSITIVE — AB

## 2020-12-14 LAB — HEPATITIS B CORE ANTIBODY, TOTAL: Hep B Core Total Ab: REACTIVE — AB

## 2020-12-14 LAB — HEPATITIS A ANTIBODY, TOTAL: Hepatitis A AB,Total: REACTIVE — AB

## 2020-12-14 LAB — AFP TUMOR MARKER: AFP-Tumor Marker: 26.6 ng/mL — ABNORMAL HIGH (ref <6.1)

## 2020-12-14 LAB — HEPATITIS B SURFACE ANTIGEN: Hepatitis B Surface Ag: NONREACTIVE

## 2020-12-14 LAB — IRON, TOTAL/TOTAL IRON BINDING CAP
%SAT: 49 % (calc) — ABNORMAL HIGH (ref 20–48)
Iron: 220 ug/dL — ABNORMAL HIGH (ref 50–180)
TIBC: 449 mcg/dL (calc) — ABNORMAL HIGH (ref 250–425)

## 2020-12-14 LAB — HIV ANTIBODY (ROUTINE TESTING W REFLEX): HIV 1&2 Ab, 4th Generation: NONREACTIVE

## 2020-12-14 LAB — ANTI-SMOOTH MUSCLE ANTIBODY, IGG: Actin (Smooth Muscle) Antibody (IGG): <20 U (ref <20)

## 2020-12-14 LAB — IGG: IgG (Immunoglobin G), Serum: 1214 mg/dL (ref 600–1540)

## 2020-12-14 LAB — FERRITIN: Ferritin: 149 ng/mL (ref 24–380)

## 2020-12-15 ENCOUNTER — Other Ambulatory Visit (INDEPENDENT_AMBULATORY_CARE_PROVIDER_SITE_OTHER): Payer: Self-pay | Admitting: Gastroenterology

## 2020-12-15 DIAGNOSIS — K7469 Other cirrhosis of liver: Secondary | ICD-10-CM

## 2020-12-21 ENCOUNTER — Telehealth: Payer: Self-pay | Admitting: *Deleted

## 2020-12-21 DIAGNOSIS — I4891 Unspecified atrial fibrillation: Secondary | ICD-10-CM | POA: Insufficient documentation

## 2020-12-21 NOTE — Telephone Encounter (Signed)
   [facility] Pre-operative Risk Assessment    HEARTCARE STAFF: - Please ensure there is not already an duplicate clearance open for this procedure. - Under Visit Info/Reason for Call, type in Other and utilize the format Clearance MM/DD/YY or Clearance TBD. Do not use dashes or single digits. - If request is for dental extraction, please clarify the # of teeth to be extracted.  Request for surgical clearance:  1. What type of surgery is being performed? LIVER Bx   2. When is this surgery scheduled? TBD   3. What type of clearance is required (medical clearance vs. Pharmacy clearance to hold med vs. Both)? BOTH  4. Are there any medications that need to be held prior to surgery and how long? ELIQUIS x 2 DAYS PRIOR   5. Practice name and name of physician performing surgery? [facility]; DR. Quillian Quince [place]  6. What is the office phone number? [phone]   7.   What is the office fax number? [phone]  8.   Anesthesia type (None, local, MAC, general) ? LOCAL   Julaine Hua 12/21/2020, 10:16 AM  _________________________________________________________________   (provider comments below)

## 2020-12-21 NOTE — Telephone Encounter (Signed)
Patient with diagnosis of afib on Eliquis for anticoagulation. Afib is not noted on patient's PMH, I have added this.  Procedure: liver biopsy Date of procedure: TBD  CHA2DS2-VASc Score = 1  This indicates a 0.6% annual risk of stroke. The patient's score is based upon: CHF History: No HTN History: No Diabetes History: No Stroke History: No Vascular Disease History: No Age Score: 1 Gender Score: 0  CrCl >193mL/min Platelet count 130K  Per office protocol, patient can hold Eliquis for 2 days prior to procedure.

## 2020-12-21 NOTE — Telephone Encounter (Signed)
Pharmacy please comment on anticoagulation and then we will contact the patient for clearance.  Kerin Ransom PA-C 12/21/2020 1:18 PM

## 2020-12-22 NOTE — Telephone Encounter (Signed)
Left message for pt to call back and schedule appt with Dr. Harl Bowie or [facility] for pre op.

## 2020-12-22 NOTE — Telephone Encounter (Signed)
   Primary Cardiologist: Carlyle Dolly, MD  Chart reviewed as part of pre-operative protocol coverage. Because of Kenneth [name]'s past medical history and time since last visit, he will require a follow-up visit in order to better assess preoperative cardiovascular risk.  Pre-op covering staff: - Please schedule appointment and call patient to inform them. Please add "pre-op clearance" to the appointment notes so provider is aware. - Please contact requesting surgeon's office via preferred method (i.e, phone, fax) to inform them of need for appointment prior to surgery.  Abigail Butts, PA-C  12/22/2020, 12:02 PM

## 2020-12-23 NOTE — Telephone Encounter (Signed)
Pt is agreeable to pre op appt for clearance. Pt has been scheduled to see Levell July, NP 01/03/21 @ 11:30. I will forward notes to NP for upcoming appt. Pt thanked me for the call and the help. I will send FYI to requesting office pt has appt.

## 2020-12-23 NOTE — Telephone Encounter (Signed)
Thanks so much, please send me a copy of the note to reach IR if cleared Thanks
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
## 2021-01-03 ENCOUNTER — Ambulatory Visit (INDEPENDENT_AMBULATORY_CARE_PROVIDER_SITE_OTHER): Payer: Medicare Other | Admitting: Family Medicine

## 2021-01-03 ENCOUNTER — Other Ambulatory Visit: Payer: Self-pay

## 2021-01-03 ENCOUNTER — Encounter (HOSPITAL_COMMUNITY): Payer: Self-pay

## 2021-01-03 ENCOUNTER — Encounter: Payer: Self-pay | Admitting: Family Medicine

## 2021-01-03 VITALS — BP 118/80 | HR 88 | Ht 72.0 in | Wt 202.8 lb

## 2021-01-03 DIAGNOSIS — I4891 Unspecified atrial fibrillation: Secondary | ICD-10-CM | POA: Diagnosis not present

## 2021-01-03 DIAGNOSIS — I340 Nonrheumatic mitral (valve) insufficiency: Secondary | ICD-10-CM | POA: Diagnosis not present

## 2021-01-03 DIAGNOSIS — Z01818 Encounter for other preprocedural examination: Secondary | ICD-10-CM | POA: Diagnosis not present

## 2021-01-03 NOTE — Progress Notes (Unsigned)
Kenneth [name] Male, 70 y.o., 1951-04-30  MRN:  [id number] Phone:  [phone] Jerilynn Mages)        PCP:  Glenda Chroman, MD Primary Cvg:  Medicare/Medicare Part A And B  Next Appt With Radiology ([facility]-US 2) 01/12/2021 at 1:00 PM          Message Received: Today  Message Details  Lovelace, Michelene Gardener, CMA  Jezlyn Westerfield, [place]  Previous Messages  ----- Message -----  From: Harvel Quale, MD  Sent: 01/03/2021  1:25 PM EDT  To: Rodney Langton, CMA, Verta Ellen., NP   Thanks Mitzi Hansen.   Darius Bump, can you please schedule inform the interventional radiology team about this? Also please reinforce the patient about the need to hold the [facility] 48 h before his procedure.   Thanks,   [name], MD  Gastroenterology and Hepatology  [facility]   ----- Message -----  From: Verta Ellen., NP  Sent: 01/03/2021  1:07 PM EDT  To: Harvel Quale, MD

## 2021-01-03 NOTE — Patient Instructions (Addendum)

## 2021-01-03 NOTE — Progress Notes (Signed)
Routing to [place] now

## 2021-01-04 NOTE — Progress Notes (Signed)
Noted, patient is aware. 

## 2021-01-10 ENCOUNTER — Other Ambulatory Visit: Payer: Self-pay | Admitting: Radiology

## 2021-01-11 ENCOUNTER — Other Ambulatory Visit: Payer: Self-pay | Admitting: Student

## 2021-01-12 ENCOUNTER — Other Ambulatory Visit: Payer: Self-pay

## 2021-01-12 ENCOUNTER — Ambulatory Visit (HOSPITAL_COMMUNITY)
Admission: RE | Admit: 2021-01-12 | Discharge: 2021-01-12 | Disposition: A | Discharge status: Home / Self Care | Payer: Medicare Other | Source: Ambulatory Visit | Attending: Gastroenterology | Admitting: Gastroenterology

## 2021-01-12 DIAGNOSIS — K760 Fatty (change of) liver, not elsewhere classified: Secondary | ICD-10-CM | POA: Diagnosis not present

## 2021-01-12 DIAGNOSIS — D649 Anemia, unspecified: Secondary | ICD-10-CM | POA: Insufficient documentation

## 2021-01-12 DIAGNOSIS — Z87891 Personal history of nicotine dependence: Secondary | ICD-10-CM | POA: Diagnosis not present

## 2021-01-12 DIAGNOSIS — K219 Gastro-esophageal reflux disease without esophagitis: Secondary | ICD-10-CM | POA: Diagnosis not present

## 2021-01-12 DIAGNOSIS — R7989 Other specified abnormal findings of blood chemistry: Secondary | ICD-10-CM | POA: Diagnosis not present

## 2021-01-12 DIAGNOSIS — K739 Chronic hepatitis, unspecified: Secondary | ICD-10-CM | POA: Insufficient documentation

## 2021-01-12 DIAGNOSIS — K824 Cholesterolosis of gallbladder: Secondary | ICD-10-CM | POA: Insufficient documentation

## 2021-01-12 DIAGNOSIS — K7469 Other cirrhosis of liver: Secondary | ICD-10-CM | POA: Diagnosis not present

## 2021-01-12 DIAGNOSIS — Z7901 Long term (current) use of anticoagulants: Secondary | ICD-10-CM | POA: Diagnosis not present

## 2021-01-12 DIAGNOSIS — R945 Abnormal results of liver function studies: Secondary | ICD-10-CM | POA: Diagnosis not present

## 2021-01-12 DIAGNOSIS — I4891 Unspecified atrial fibrillation: Secondary | ICD-10-CM | POA: Insufficient documentation

## 2021-01-12 DIAGNOSIS — K7460 Unspecified cirrhosis of liver: Secondary | ICD-10-CM | POA: Diagnosis not present

## 2021-01-12 DIAGNOSIS — K7401 Hepatic fibrosis, early fibrosis: Secondary | ICD-10-CM | POA: Diagnosis not present

## 2021-01-12 DIAGNOSIS — Z79899 Other long term (current) drug therapy: Secondary | ICD-10-CM | POA: Diagnosis not present

## 2021-01-12 LAB — CBC
HCT: 47.6 % (ref 39.0–52.0)
Hemoglobin: 15.9 g/dL (ref 13.0–17.0)
MCH: 30.9 pg (ref 26.0–34.0)
MCHC: 33.4 g/dL (ref 30.0–36.0)
MCV: 92.6 fL (ref 80.0–100.0)
Platelets: 119 10*3/uL — ABNORMAL LOW (ref 150–400)
RBC: 5.14 MIL/uL (ref 4.22–5.81)
RDW: 13.3 % (ref 11.5–15.5)
WBC: 4.3 10*3/uL (ref 4.0–10.5)
nRBC: 0 % (ref 0.0–0.2)

## 2021-01-12 LAB — PROTIME-INR
INR: 1.1 (ref 0.8–1.2)
Prothrombin Time: 13.3 seconds (ref 11.4–15.2)

## 2021-01-12 MED ORDER — MIDAZOLAM HCL 2 MG/2ML IJ SOLN
INTRAMUSCULAR | Status: AC | PRN
  Administered 2021-01-12: 1 mg via INTRAVENOUS

## 2021-01-12 MED ORDER — FENTANYL CITRATE (PF) 100 MCG/2ML IJ SOLN
INTRAMUSCULAR | Status: AC
  Filled 2021-01-12: qty 2

## 2021-01-12 MED ORDER — LIDOCAINE HCL (PF) 1 % IJ SOLN
INTRAMUSCULAR | Status: AC
  Filled 2021-01-12: qty 30

## 2021-01-12 MED ORDER — FENTANYL CITRATE (PF) 100 MCG/2ML IJ SOLN
INTRAMUSCULAR | Status: AC | PRN
  Administered 2021-01-12: 50 ug via INTRAVENOUS

## 2021-01-12 MED ORDER — GELATIN ABSORBABLE 12-7 MM EX MISC
CUTANEOUS | Status: AC
  Filled 2021-01-12: qty 1

## 2021-01-12 MED ORDER — SODIUM CHLORIDE 0.9 % IV SOLN: INTRAVENOUS | Status: DC

## 2021-01-12 MED ORDER — MIDAZOLAM HCL 2 MG/2ML IJ SOLN
INTRAMUSCULAR | Status: AC
  Filled 2021-01-12: qty 2
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
## 2021-01-12 NOTE — Discharge Instructions (Signed)
Lung Biopsy, Care After  This information will help you take care of yourself after your procedure. Your health care provider may also give you more specific instructions depending on the type of biopsy you had. If you have problems or questions, contact your health care provider.  What can I expect after the procedure?  After the procedure, it is common to have:   A cough.   A sore throat.   Pain where a needle or incision was used to collect a biopsy sample (biopsy site).  Follow these instructions at home:  Medicines   Take over-the-counter and prescription medicines only as told by your health care provider.   Talk to your health care provider before you take any medicines that contain aspirin or NSAIDS, such as ibuprofen. These medicines can increase your risk of bleeding.   Ask your health care provider if the medicine prescribed to you:  ? Requires you to avoid driving or using machinery.  ? Can cause constipation. You may need to take these actions to prevent or treat constipation:   Drink enough fluid to keep your urine pale yellow.   Take over-the-counter or prescription medicines.   Eat foods that are high in fiber, such as beans, whole grains, fresh fruits and vegetables.   Limit foods that are high in fat and processed sugars, such as fried or sweet foods.  Biopsy site care     If you had a needle or open biopsy, follow instructions from your health care provider about how to take care of your biopsy site. Make sure you:  ? Wash your hands with soap and water for at least 20 seconds before and after you change your bandage (dressing). If soap and water are not available, use hand sanitizer.  ? Change your dressing as told by your health care provider.  ? Leave stitches (sutures), skin glue, or adhesive strips in place for as long as you are told. If adhesive strip edges start to loosen and curl up, you may trim the loose edges. Do not remove adhesive strips completely unless your health care  provider tells you to do that.   Do not take baths, swim, or use a hot tub until your health care provider approves. Ask your health care provider if you may take showers. You may only be allowed to take sponge baths.   Check your biopsy site every day for signs of infection. Check for:  ? Redness, swelling, or more pain.  ? Fluid or blood.  ? Warmth.  ? Pus or a bad smell.  General instructions   Do not drink alcohol if your health care provider tells you not to drink.   If you were given a sedative during the procedure, it can affect you for several hours. Do not drive or operate machinery until your health care provider says that it is safe.   Return to your normal activities as told by your health care provider. Ask your health care provider what activities are safe for you.   It is up to you to get the results of your procedure. Ask your health care provider, or the department that is doing the procedure, when your results will be ready.   Keep all follow-up visits as told by your health care provider. This is important.  Contact a health care provider if:   You have a fever.   You have redness, swelling, or more pain around your biopsy site.   You have fluid or blood   coming from your biopsy site.   Your biopsy site feels warm to the touch.   You have pus or a bad smell coming from your biopsy site.   You have pain that does not get better with medicine.  Get help right away if:   You cough up blood.   You have trouble breathing.   You have chest pain.   You lose consciousness.  These symptoms may represent a serious problem that is an emergency. Do not wait to see if the symptoms will go away. Get medical help right away. Call your local emergency services (911 in the [place] Do not drive yourself to the hospital.  Summary   It is common to have some pain where a needle or incision was used to collect a biopsy sample (biopsy site).   Return to your normal activities as told by your health  care provider. Ask your health care provider what activities are safe for you.   Take over-the-counter and prescription medicines only as told by your health care provider.   Report any unusual symptoms to your health care provider.  This information is not intended to replace advice given to you by your health care provider. Make sure you discuss any questions you have with your health care provider.  Document Revised: 09/06/2019 Document Reviewed: 09/06/2019  Elsevier Patient Education  2021 Elsevier Inc.

## 2021-01-12 NOTE — Procedures (Signed)
Interventional Radiology Procedure Note  Procedure: US guided biopsy of liver, medical liver Complications: None EBL: None Recommendations: - Bedrest 2 hours.   - Routine wound care - Follow up pathology - Advance diet   Signed,  Zenovia Justman, DO   

## 2021-01-12 NOTE — Progress Notes (Signed)
Discharge instructions reviewed with pt and his friend Corporate treasurer both voice understanding.

## 2021-01-13 ENCOUNTER — Telehealth (INDEPENDENT_AMBULATORY_CARE_PROVIDER_SITE_OTHER): Payer: Self-pay | Admitting: Gastroenterology

## 2021-01-13 LAB — SURGICAL PATHOLOGY

## 2021-01-13 NOTE — Telephone Encounter (Signed)
I called the patient to inform about the results of liver biopsy which showed grade 2 hepatitis and 2 out of 4 fibrosis.  We will start process for Epclusa for 12 weeks.  I advised the patient to avoid taking PPIs, he understood and agreed..  We will need to get CMP and HCVRNA 4 weeks after starting treatment.  [name], MD Gastroenterology and Hepatology [facility]

## 2021-01-14 NOTE — Telephone Encounter (Signed)
Noted to have labs drawn four weeks after starting treatment.  Forms faxed 01/14/2021 to [employer] to get the medication approved and shipped to the office.

## 2021-01-17 ENCOUNTER — Telehealth (INDEPENDENT_AMBULATORY_CARE_PROVIDER_SITE_OTHER): Payer: Self-pay

## 2021-01-17 NOTE — Telephone Encounter (Signed)
Patient called and wanted to ask if he could go ahead and get his Second booster injection. He has had the two Covid shots in the series and one booster as of now and is interested in getting the 4th shot or the 2nd booster. Please advise he would like to know if you recommend.

## 2021-01-17 NOTE — Telephone Encounter (Signed)
Patient aware of all.

## 2021-01-17 NOTE — Telephone Encounter (Signed)
Hi, There is no contraindication for the 2 d booster and he is above 50 years, so he can go ahead with it.  Thanks

## 2021-01-17 NOTE — Telephone Encounter (Signed)
Patient aware the Kenneth [name] is approved by insurance and aware to answer any calls that come in  in the next few days as this maybe the pharmacy trying to set up co pay and shipment .

## 2021-01-18 DIAGNOSIS — Z23 Encounter for immunization: Secondary | ICD-10-CM | POA: Diagnosis not present

## 2021-01-18 NOTE — Telephone Encounter (Signed)
Patient aware the first shipment will need to come to the office and he will need to let me know exactly the first day he starts treatment as he will have to have blood work done four weeks after starting. Patient states understanding.

## 2021-01-19 ENCOUNTER — Telehealth (INDEPENDENT_AMBULATORY_CARE_PROVIDER_SITE_OTHER): Payer: Self-pay

## 2021-01-19 ENCOUNTER — Other Ambulatory Visit (INDEPENDENT_AMBULATORY_CARE_PROVIDER_SITE_OTHER): Payer: Self-pay

## 2021-01-19 DIAGNOSIS — K7469 Other cirrhosis of liver: Secondary | ICD-10-CM

## 2021-01-19 DIAGNOSIS — R7989 Other specified abnormal findings of blood chemistry: Secondary | ICD-10-CM

## 2021-01-19 DIAGNOSIS — R768 Other specified abnormal immunological findings in serum: Secondary | ICD-10-CM

## 2021-01-19 DIAGNOSIS — Z7901 Long term (current) use of anticoagulants: Secondary | ICD-10-CM

## 2021-01-19 DIAGNOSIS — K738 Other chronic hepatitis, not elsewhere classified: Secondary | ICD-10-CM

## 2021-01-19 NOTE — Telephone Encounter (Signed)
Patient wants to know what he can take for reflux if he can not take the Prilosec. He is aware he may resume after the discontinuation of Epculsa.

## 2021-01-19 NOTE — Telephone Encounter (Signed)
Patient is aware he will need labs drawn 02/17/2021. Orders mailed to the patient. He has picked up the first shipment of medication here at the office.

## 2021-01-19 NOTE — Telephone Encounter (Signed)
Patient aware the medication has arrived at the office and he will pick up today 01/19/2021.

## 2021-01-19 NOTE — Telephone Encounter (Signed)
Per [employer] receptionist here at [employer], [employer] called and states the patient's medication will be delivered today 01/19/2021 to the office.

## 2021-01-19 NOTE — Telephone Encounter (Signed)
Patient aware of all.

## 2021-02-08 DIAGNOSIS — M19011 Primary osteoarthritis, right shoulder: Secondary | ICD-10-CM | POA: Diagnosis not present

## 2021-02-08 DIAGNOSIS — Z9049 Acquired absence of other specified parts of digestive tract: Secondary | ICD-10-CM | POA: Diagnosis not present

## 2021-02-08 DIAGNOSIS — S40211A Abrasion of right shoulder, initial encounter: Secondary | ICD-10-CM | POA: Diagnosis not present

## 2021-02-08 DIAGNOSIS — Z79899 Other long term (current) drug therapy: Secondary | ICD-10-CM | POA: Diagnosis not present

## 2021-02-08 DIAGNOSIS — I4891 Unspecified atrial fibrillation: Secondary | ICD-10-CM | POA: Diagnosis not present

## 2021-02-08 DIAGNOSIS — Z87891 Personal history of nicotine dependence: Secondary | ICD-10-CM | POA: Diagnosis not present

## 2021-02-08 DIAGNOSIS — Z7901 Long term (current) use of anticoagulants: Secondary | ICD-10-CM | POA: Diagnosis not present

## 2021-02-08 DIAGNOSIS — W130XXA Fall from, out of or through balcony, initial encounter: Secondary | ICD-10-CM | POA: Diagnosis not present

## 2021-02-08 DIAGNOSIS — J449 Chronic obstructive pulmonary disease, unspecified: Secondary | ICD-10-CM | POA: Diagnosis not present

## 2021-02-08 DIAGNOSIS — Z299 Encounter for prophylactic measures, unspecified: Secondary | ICD-10-CM | POA: Diagnosis not present

## 2021-02-08 DIAGNOSIS — Z1339 Encounter for screening examination for other mental health and behavioral disorders: Secondary | ICD-10-CM | POA: Diagnosis not present

## 2021-02-08 DIAGNOSIS — R5383 Other fatigue: Secondary | ICD-10-CM | POA: Diagnosis not present

## 2021-02-08 DIAGNOSIS — Z0000 Encounter for general adult medical examination without abnormal findings: Secondary | ICD-10-CM | POA: Diagnosis not present

## 2021-02-08 DIAGNOSIS — Z125 Encounter for screening for malignant neoplasm of prostate: Secondary | ICD-10-CM | POA: Diagnosis not present

## 2021-02-08 DIAGNOSIS — Z7189 Other specified counseling: Secondary | ICD-10-CM | POA: Diagnosis not present

## 2021-02-08 DIAGNOSIS — Z1331 Encounter for screening for depression: Secondary | ICD-10-CM | POA: Diagnosis not present

## 2021-02-08 DIAGNOSIS — E7800 Pure hypercholesterolemia, unspecified: Secondary | ICD-10-CM | POA: Diagnosis not present

## 2021-02-08 DIAGNOSIS — R030 Elevated blood-pressure reading, without diagnosis of hypertension: Secondary | ICD-10-CM | POA: Diagnosis not present

## 2021-02-08 DIAGNOSIS — S50811A Abrasion of right forearm, initial encounter: Secondary | ICD-10-CM | POA: Diagnosis not present

## 2021-02-08 DIAGNOSIS — Z6826 Body mass index (BMI) 26.0-26.9, adult: Secondary | ICD-10-CM | POA: Diagnosis not present

## 2021-02-08 DIAGNOSIS — I10 Essential (primary) hypertension: Secondary | ICD-10-CM | POA: Diagnosis not present

## 2021-02-08 DIAGNOSIS — M25511 Pain in right shoulder: Secondary | ICD-10-CM | POA: Diagnosis not present

## 2021-02-10 ENCOUNTER — Ambulatory Visit (INDEPENDENT_AMBULATORY_CARE_PROVIDER_SITE_OTHER): Payer: Medicare Other

## 2021-02-10 DIAGNOSIS — I340 Nonrheumatic mitral (valve) insufficiency: Secondary | ICD-10-CM | POA: Diagnosis not present

## 2021-02-10 LAB — ECHOCARDIOGRAM COMPLETE
Area-P 1/2: 4.39 cm2
Calc EF: 55.8 %
MV M vel: 4.7 m/s
MV Peak grad: 88.4 mmHg
Radius: 0.3 cm
S' Lateral: 3.48 cm
Single Plane A2C EF: 52.9 %
Single Plane A4C EF: 58.3 %

## 2021-02-11 ENCOUNTER — Telehealth: Payer: Self-pay | Admitting: *Deleted

## 2021-02-11 DIAGNOSIS — J449 Chronic obstructive pulmonary disease, unspecified: Secondary | ICD-10-CM | POA: Diagnosis not present

## 2021-02-11 DIAGNOSIS — Z299 Encounter for prophylactic measures, unspecified: Secondary | ICD-10-CM | POA: Diagnosis not present

## 2021-02-11 DIAGNOSIS — I10 Essential (primary) hypertension: Secondary | ICD-10-CM | POA: Diagnosis not present

## 2021-02-11 DIAGNOSIS — L98499 Non-pressure chronic ulcer of skin of other sites with unspecified severity: Secondary | ICD-10-CM | POA: Diagnosis not present

## 2021-02-11 DIAGNOSIS — M25519 Pain in unspecified shoulder: Secondary | ICD-10-CM | POA: Diagnosis not present

## 2021-02-11 DIAGNOSIS — I4891 Unspecified atrial fibrillation: Secondary | ICD-10-CM | POA: Diagnosis not present

## 2021-02-11 NOTE — Telephone Encounter (Signed)
Per [employer] called and stated the patients second shipment to the office will be delivered to the office on 02/14/2021.

## 2021-02-11 NOTE — Telephone Encounter (Signed)
Laurine Blazer, LPN  10/16/5629 4:97 AM EDT Back to Top     Notified, copy to pcp.

## 2021-02-11 NOTE — Telephone Encounter (Signed)
-----   Message from Kenneth [name]., NP sent at 02/10/2021 11:35 PM EDT ----- Please call the patient and let him know the echocardiogram showed the heart has good pumping function. The mitral valve and tricuspid valves have some mild to moderate leaking. This is basically unchanged since the prior echocardiogram in 2019. The pumping function has actually improved since last echocardiogram in 2019

## 2021-02-14 NOTE — Telephone Encounter (Signed)
Patient states he will go have his labs drawn on Thursday 02/17/2021.

## 2021-02-14 NOTE — Telephone Encounter (Signed)
Per Patient his medication was delivered today to his home.

## 2021-02-17 DIAGNOSIS — K738 Other chronic hepatitis, not elsewhere classified: Secondary | ICD-10-CM | POA: Diagnosis not present

## 2021-02-17 DIAGNOSIS — K7469 Other cirrhosis of liver: Secondary | ICD-10-CM | POA: Diagnosis not present

## 2021-02-17 DIAGNOSIS — S46011A Strain of muscle(s) and tendon(s) of the rotator cuff of right shoulder, initial encounter: Secondary | ICD-10-CM | POA: Diagnosis not present

## 2021-02-17 DIAGNOSIS — S4991XA Unspecified injury of right shoulder and upper arm, initial encounter: Secondary | ICD-10-CM | POA: Diagnosis not present

## 2021-02-17 DIAGNOSIS — R7989 Other specified abnormal findings of blood chemistry: Secondary | ICD-10-CM | POA: Diagnosis not present

## 2021-02-17 DIAGNOSIS — Z7901 Long term (current) use of anticoagulants: Secondary | ICD-10-CM | POA: Diagnosis not present

## 2021-02-17 DIAGNOSIS — L089 Local infection of the skin and subcutaneous tissue, unspecified: Secondary | ICD-10-CM | POA: Diagnosis not present

## 2021-02-17 DIAGNOSIS — R768 Other specified abnormal immunological findings in serum: Secondary | ICD-10-CM | POA: Diagnosis not present

## 2021-02-17 NOTE — Telephone Encounter (Signed)
Patient stopped by the office to report he had his labs drawn today 02/17/2021.

## 2021-02-19 LAB — COMPREHENSIVE METABOLIC PANEL
AG Ratio: 1.4 (calc) (ref 1.0–2.5)
ALT: 18 U/L (ref 9–46)
AST: 23 U/L (ref 10–35)
Albumin: 4.3 g/dL (ref 3.6–5.1)
Alkaline phosphatase (APISO): 53 U/L (ref 35–144)
BUN: 13 mg/dL (ref 7–25)
CO2: 31 mmol/L (ref 20–32)
Calcium: 9.8 mg/dL (ref 8.6–10.3)
Chloride: 104 mmol/L (ref 98–110)
Creat: 0.84 mg/dL (ref 0.70–1.18)
Globulin: 3.1 g/dL (calc) (ref 1.9–3.7)
Glucose, Bld: 92 mg/dL (ref 65–99)
Potassium: 4.3 mmol/L (ref 3.5–5.3)
Sodium: 143 mmol/L (ref 135–146)
Total Bilirubin: 1.1 mg/dL (ref 0.2–1.2)
Total Protein: 7.4 g/dL (ref 6.1–8.1)

## 2021-02-19 LAB — HEPATITIS C RNA QUANTITATIVE
HCV Quantitative Log: <1.18 log IU/mL
HCV RNA, PCR, QN: <15 IU/mL

## 2021-02-21 NOTE — Telephone Encounter (Signed)
Hi Please let him know he needs a repeat CMP and HCV RNA 1 week after finishing his last his last pill? He needs to see me a week after this test is performed.  Please let mitzie know to make the follow up appointment  Thanks

## 2021-02-21 NOTE — Telephone Encounter (Signed)
Per patient labs he is responding to treatment. Will he need further blood work?

## 2021-02-22 ENCOUNTER — Other Ambulatory Visit (INDEPENDENT_AMBULATORY_CARE_PROVIDER_SITE_OTHER): Payer: Self-pay

## 2021-02-22 DIAGNOSIS — R768 Other specified abnormal immunological findings in serum: Secondary | ICD-10-CM

## 2021-02-22 NOTE — Telephone Encounter (Signed)
Patient aware he will need repeat lab work one week after finishing his medication. He will call the office one week before his last pill to set up an appt with Dr. Loletha Grayer.

## 2021-02-22 NOTE — Telephone Encounter (Signed)
Orders placed in Epic.Orders mailed to the patient.

## 2021-02-24 DIAGNOSIS — X58XXXD Exposure to other specified factors, subsequent encounter: Secondary | ICD-10-CM | POA: Diagnosis not present

## 2021-02-24 DIAGNOSIS — S46011D Strain of muscle(s) and tendon(s) of the rotator cuff of right shoulder, subsequent encounter: Secondary | ICD-10-CM | POA: Diagnosis not present

## 2021-02-28 NOTE — Telephone Encounter (Signed)
Patient states understanding regarding lab work.

## 2021-03-17 DIAGNOSIS — S4991XD Unspecified injury of right shoulder and upper arm, subsequent encounter: Secondary | ICD-10-CM | POA: Diagnosis not present

## 2021-03-17 DIAGNOSIS — S46011D Strain of muscle(s) and tendon(s) of the rotator cuff of right shoulder, subsequent encounter: Secondary | ICD-10-CM | POA: Diagnosis not present

## 2021-03-21 NOTE — Telephone Encounter (Signed)
Should finish medication around 04/14/2021.

## 2021-03-22 DIAGNOSIS — H7011 Chronic mastoiditis, right ear: Secondary | ICD-10-CM | POA: Diagnosis not present

## 2021-04-12 ENCOUNTER — Other Ambulatory Visit (INDEPENDENT_AMBULATORY_CARE_PROVIDER_SITE_OTHER): Payer: Self-pay

## 2021-04-12 DIAGNOSIS — R768 Other specified abnormal immunological findings in serum: Secondary | ICD-10-CM

## 2021-04-12 NOTE — Telephone Encounter (Signed)
Patient will finish his last pill on 04/14/2021. Patient aware to have done around 04/21/2021 at [facility]. He will pick the orders up from the front desk here at the office and schedule a f/u visit with Dr. Jenetta Downer.

## 2021-04-21 DIAGNOSIS — R768 Other specified abnormal immunological findings in serum: Secondary | ICD-10-CM | POA: Diagnosis not present

## 2021-04-25 LAB — COMPREHENSIVE METABOLIC PANEL
AG Ratio: 1.5 (calc) (ref 1.0–2.5)
ALT: 15 U/L (ref 9–46)
AST: 19 U/L (ref 10–35)
Albumin: 4.3 g/dL (ref 3.6–5.1)
Alkaline phosphatase (APISO): 49 U/L (ref 35–144)
BUN: 9 mg/dL (ref 7–25)
CO2: 30 mmol/L (ref 20–32)
Calcium: 9.1 mg/dL (ref 8.6–10.3)
Chloride: 106 mmol/L (ref 98–110)
Creat: 0.78 mg/dL (ref 0.70–1.28)
Globulin: 2.8 g/dL (calc) (ref 1.9–3.7)
Glucose, Bld: 88 mg/dL (ref 65–99)
Potassium: 4.1 mmol/L (ref 3.5–5.3)
Sodium: 142 mmol/L (ref 135–146)
Total Bilirubin: 1.1 mg/dL (ref 0.2–1.2)
Total Protein: 7.1 g/dL (ref 6.1–8.1)

## 2021-04-25 LAB — HEPATITIS C RNA QUANTITATIVE
HCV Quantitative Log: <1.18 log IU/mL
HCV RNA, PCR, QN: <15 IU/mL

## 2021-04-26 ENCOUNTER — Other Ambulatory Visit: Payer: Self-pay | Admitting: Family Medicine

## 2021-04-26 ENCOUNTER — Ambulatory Visit (INDEPENDENT_AMBULATORY_CARE_PROVIDER_SITE_OTHER): Payer: Medicare Other | Admitting: Gastroenterology

## 2021-04-26 ENCOUNTER — Encounter (INDEPENDENT_AMBULATORY_CARE_PROVIDER_SITE_OTHER): Payer: Self-pay | Admitting: Gastroenterology

## 2021-04-26 ENCOUNTER — Other Ambulatory Visit: Payer: Self-pay

## 2021-04-26 DIAGNOSIS — K824 Cholesterolosis of gallbladder: Secondary | ICD-10-CM

## 2021-04-26 DIAGNOSIS — K219 Gastro-esophageal reflux disease without esophagitis: Secondary | ICD-10-CM

## 2021-04-26 DIAGNOSIS — B182 Chronic viral hepatitis C: Secondary | ICD-10-CM
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
## 2021-04-26 NOTE — Patient Instructions (Addendum)
Perform blood workup in mid October 2022 Referral to general surgeon for cholecystectomy Continue famotidine every day, if heartburn worsens can stop it and start omeprazole daily instead

## 2021-04-29 DIAGNOSIS — J449 Chronic obstructive pulmonary disease, unspecified: Secondary | ICD-10-CM | POA: Diagnosis not present

## 2021-04-29 DIAGNOSIS — Z299 Encounter for prophylactic measures, unspecified: Secondary | ICD-10-CM | POA: Diagnosis not present

## 2021-04-29 DIAGNOSIS — R109 Unspecified abdominal pain: Secondary | ICD-10-CM | POA: Diagnosis not present

## 2021-04-29 DIAGNOSIS — R319 Hematuria, unspecified: Secondary | ICD-10-CM | POA: Diagnosis not present

## 2021-04-29 DIAGNOSIS — I4891 Unspecified atrial fibrillation: Secondary | ICD-10-CM | POA: Diagnosis not present

## 2021-04-29 DIAGNOSIS — I10 Essential (primary) hypertension: Secondary | ICD-10-CM | POA: Diagnosis not present

## 2021-05-02 DIAGNOSIS — R310 Gross hematuria: Secondary | ICD-10-CM | POA: Diagnosis not present

## 2021-05-04 DIAGNOSIS — K824 Cholesterolosis of gallbladder: Secondary | ICD-10-CM | POA: Diagnosis not present

## 2021-05-05 DIAGNOSIS — K8020 Calculus of gallbladder without cholecystitis without obstruction: Secondary | ICD-10-CM | POA: Diagnosis not present

## 2021-05-05 DIAGNOSIS — R310 Gross hematuria: Secondary | ICD-10-CM | POA: Diagnosis not present

## 2021-05-05 DIAGNOSIS — N200 Calculus of kidney: Secondary | ICD-10-CM | POA: Diagnosis not present

## 2021-05-05 DIAGNOSIS — K439 Ventral hernia without obstruction or gangrene: Secondary | ICD-10-CM | POA: Diagnosis not present

## 2021-05-12 DIAGNOSIS — D414 Neoplasm of uncertain behavior of bladder: Secondary | ICD-10-CM | POA: Diagnosis not present

## 2021-05-17 ENCOUNTER — Ambulatory Visit: Payer: Medicare Other | Admitting: General Surgery

## 2021-05-20 ENCOUNTER — Other Ambulatory Visit: Payer: Self-pay | Admitting: Urology

## 2021-05-20 ENCOUNTER — Telehealth: Payer: Self-pay | Admitting: Cardiology

## 2021-05-20 NOTE — Telephone Encounter (Signed)
   [facility] Pre-operative Risk Assessment    HEARTCARE STAFF: - Please ensure there is not already an duplicate clearance open for this procedure. - Under Visit Info/Reason for Call, type in Other and utilize the format Clearance MM/DD/YY or Clearance TBD. Do not use dashes or single digits. - If request is for dental extraction, please clarify the # of teeth to be extracted.  Request for surgical clearance:  What type of surgery is being performed? Trans Urethral Resection of Bladder Tumor     When is this surgery scheduled? TBD   What type of clearance is required (medical clearance vs. Pharmacy clearance to hold med vs. Both)? Both  Are there any medications that need to be held prior to surgery and how long? [place] HOLD for 3 days prior  Practice name and name of physician performing surgery? [facility] Dr. Mason Jim  What is the office phone number? [phone]   7.   What is the office fax number? [phone]  8.   Anesthesia type (None, local, MAC, general) ? General   Nelda Marseille 05/20/2021, 9:29 AM  _________________________________________________________________   (provider comments below)

## 2021-05-20 NOTE — Telephone Encounter (Signed)
   Patient Name: ZACHRY DONISI  DOB: 1950-12-02 MRN: [id number]  Primary Cardiologist: Carlyle Dolly, MD  Chart reviewed as part of pre-operative protocol coverage.  This patient has his 6 month follow-up coming up on 06/07/21 with Katina Dung PA-C. Per Epic it appears his bladder surgery is scheduled for 06/17/21 which gives [place] time to complete the OV on 06/07/21 to finalize clearance. I will add pre-op clearance to appointment notes so APP is aware. Will route to pharmacy team to review anticoagulation so that this information is available to [place] when he sees the patient. Pharmacy team - no need to route back to preop box, just helpful for APP to be able to reference at time of visit.  Will route to [place] as FYI only, no action needed until OV. Per office protocol, the provider should assess clearance at time of office visit and should forward their finalized clearance decision to requesting party below. I will remove this message from the pre-op box. Will also route this message via Epic fax function to requesting surgeon so that they are aware.  Charlie Pitter, PA-C 05/20/2021, 3:33 PM

## 2021-05-23 NOTE — Telephone Encounter (Signed)
Patient with diagnosis of atrial fibrillation on Eliquis for anticoagulation.    Procedure: transurethral resection of bladder tumor  Date of procedure: TBD   CHA2DS2-VASc Score = 1  This indicates a 0.6% annual risk of stroke. The patient's score is based upon: CHF History: No HTN History: No Diabetes History: No Stroke History: No Vascular Disease History: No Age Score: 1 Gender Score: 0       CrCl 108 Platelet count 119  Per office protocol, patient can hold Eliquis for 3 days prior to procedure.   Patient will not need bridging with Lovenox (enoxaparin) around procedure.

## 2021-05-24 ENCOUNTER — Telehealth: Payer: Self-pay | Admitting: Cardiology

## 2021-05-24 NOTE — Telephone Encounter (Signed)
   [facility] Pre-operative Risk Assessment    Patient Name: Kenneth [name]  DOB: 02-Sep-1951 MRN: [id number]  HEARTCARE STAFF:  - IMPORTANT!!!!!! Under Visit Info/Reason for Call, type in Other and utilize the format Clearance MM/DD/YY or Clearance TBD. Do not use dashes or single digits. - Please review there is not already an duplicate clearance open for this procedure. - If request is for dental extraction, please clarify the # of teeth to be extracted. - If the patient is currently at the dentist's office, call Pre-Op Callback Staff (MA/nurse) to input urgent request.  - If the patient is not currently in the dentist office, please route to the Pre-Op pool.  Request for surgical clearance:  What type of surgery is being performed?  TRANSURETHRAL RESECTION OF BLADDER TUMOR (TURBT) WITH POST   OPERATIVE INSTILLATION OF GEMCITABINE  When is this surgery scheduled?  Pending Clearance- TBD, Needing ASAP  What type of clearance is required (medical clearance vs. Pharmacy clearance to hold med vs. Both)?  Both   Are there any medications that need to be held prior to surgery and how long?  Eliquis will need to be held 3 days prior to procedure  Practice name and name of physician performing surgery?  [facility]- Dr. Festus Aloe   What is the office phone number?  [phone] Ext: [phone]   3.   What is the office fax number?          1.   [phone]  8.   Anesthesia type (None, local, MAC, general) ?         1.   Does not state    Desma Paganini 05/24/2021, 9:00 AM  _________________________________________________________________   (provider comments below)

## 2021-05-24 NOTE — Telephone Encounter (Signed)
   Name: Kenneth [name]  DOB: 17-Nov-1950  MRN: [id number]  Primary Cardiologist: Carlyle Dolly, MD  Chart reviewed as part of pre-operative protocol coverage. Duplicate request. Patient is scheduled to see Katina Dung, NP 06/07/21 for 6 month follow-up, at which time preop status will be addressed. Eiiquis recommendations provided by pharmacy in telephone note 05/20/21.   Pre-op covering staff: - Please contact requesting surgeon's office via preferred method (i.e, phone, fax) to inform them of need for appointment prior to surgery.   Abigail Butts, PA-C  05/24/2021, 10:51 AM

## 2021-05-24 NOTE — Telephone Encounter (Signed)
Faxed to requesting surgeons office via the epic fax function to make them aware.

## 2021-05-24 NOTE — Telephone Encounter (Signed)
Duplicate clearance request, already addressed 8/12 clearance note. Procedure scheduled on 9/9, pt cleared to hold Eliquis 3 days prior to procedure.

## 2021-06-01 DIAGNOSIS — R319 Hematuria, unspecified: Secondary | ICD-10-CM | POA: Diagnosis not present

## 2021-06-01 DIAGNOSIS — Z299 Encounter for prophylactic measures, unspecified: Secondary | ICD-10-CM | POA: Diagnosis not present

## 2021-06-01 DIAGNOSIS — I10 Essential (primary) hypertension: Secondary | ICD-10-CM | POA: Diagnosis not present

## 2021-06-01 DIAGNOSIS — I4891 Unspecified atrial fibrillation: Secondary | ICD-10-CM | POA: Diagnosis not present
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
## 2021-06-07 ENCOUNTER — Encounter: Payer: Self-pay | Admitting: Family Medicine

## 2021-06-07 ENCOUNTER — Ambulatory Visit (INDEPENDENT_AMBULATORY_CARE_PROVIDER_SITE_OTHER): Payer: Medicare Other | Admitting: Family Medicine

## 2021-06-07 ENCOUNTER — Other Ambulatory Visit: Payer: Self-pay

## 2021-06-07 VITALS — BP 122/78 | HR 79 | Ht 72.0 in | Wt 192.4 lb

## 2021-06-07 DIAGNOSIS — Z01818 Encounter for other preprocedural examination: Secondary | ICD-10-CM

## 2021-06-07 DIAGNOSIS — I4891 Unspecified atrial fibrillation: Secondary | ICD-10-CM

## 2021-06-07 DIAGNOSIS — I340 Nonrheumatic mitral (valve) insufficiency: Secondary | ICD-10-CM

## 2021-06-07 NOTE — Patient Instructions (Addendum)
Medication Instructions:   Your physician recommends that you continue on your current medications as directed. Please refer to the Current Medication list given to you today.  Labwork:  none  Testing/Procedures:  none  Follow-Up:  Your physician recommends that you schedule a follow-up appointment in: 3 months.  Any Other Special Instructions Will Be Listed Below (If Applicable).  If you need a refill on your cardiac medications before your next appointment, please call your pharmacy. 

## 2021-06-15 ENCOUNTER — Other Ambulatory Visit: Payer: Self-pay

## 2021-06-15 ENCOUNTER — Encounter (HOSPITAL_BASED_OUTPATIENT_CLINIC_OR_DEPARTMENT_OTHER): Payer: Self-pay | Admitting: Urology

## 2021-06-15 NOTE — Progress Notes (Signed)
Spoke w/ via phone for pre-op interview--- Pt Lab needs dos----  cmp           Lab results------ current ekg in epic/ chart COVID test -----patient states asymptomatic no test needed Arrive at ------- 0530 on 06-17-2021 NPO after MN NO Solid Food.  Clear liquids from MN until--- 0430 Med rec completed Medications to take morning of surgery ----- pepcid Diabetic medication ----- n/a Patient instructed no nail polish to be worn day of surgery Patient instructed to bring photo id and insurance card day of surgery Patient aware to have Driver (ride ) / caregiver  for 24 hours after surgery --friend, Kenneth [name] Patient Special Instructions ----- n/a Pre-Op special Istructions ----- pt has cardiac office visit clearance by Kenneth July NP, in epic/ chart Patient verbalized understanding of instructions that were given at this phone interview. Patient denies shortness of breath, chest pain, fever, cough at this phone interview.    Anesthesia Review:  unspecified AFib;  hx hep c, completed 12 treatment 04-14-2021, lab 04-21-2021 hcv undetectable;  pt denies cardiac s&s, sob, and no peripheral   PCP: Kenneth [name] Cardiologist : Kenneth [name] (lov w/ clearance 06-07-2021 epic) Chest x-ray : no EKG : 01-03-2021 epic Echo : 02-10-2021 epic Stress test: no Cardiac Cath : no Activity level: denies sob w/ any activity Sleep Study/ CPAP : no Blood Thinner/ Instructions /Last Dose: Eliquis ASA / Instructions/ Last Dose :  no Per pt and and cardiac office visit was given to stop eliquis, which he has been off and on for past few months due to hematuria,  stated last dose 06-08-2021

## 2021-06-16 NOTE — Anesthesia Preprocedure Evaluation (Addendum)
Anesthesia Evaluation  Patient identified by MRN, date of birth, ID band Patient awake    Reviewed: Allergy & Precautions, NPO status , Patient's Chart, lab work & pertinent test results  Airway Mallampati: II       Dental  (+) Edentulous Upper   Pulmonary neg pulmonary ROS, COPD, former smoker,    Pulmonary exam normal breath sounds clear to auscultation       Cardiovascular Exercise Tolerance: Good + dysrhythmias (on eliquis) Atrial Fibrillation + Valvular Problems/Murmurs MR  Rhythm:Irregular Rate:Normal  Left ventricular ejection fraction, by estimation, is 55 to 60%. The left ventricle has normal function. The left ventricle has no regional wall motion abnormalities. Left ventricular diastolic parameters are indeterminate. 2. Right ventricular systolic function is low normal. The right ventricular size is mildly enlarged. There is normal pulmonary artery systolic pressure. The estimated right ventricular systolic pressure is [id number] mmHg. 3. Left atrial size was moderately dilated. 4. Right atrial size was moderately dilated. 5. There is a trivial pericardial effusion posterior to the left ventricle. 6. The mitral valve is grossly normal. Mild to moderate mitral valve regurgitation. 7. Tricuspid valve regurgitation is mild to moderate. 8. The aortic valve is tricuspid. There is mild calcification of the aortic valve. Aortic valve regurgitation is not visualized. 9. The inferior vena cava is normal in size with greater than 50% respiratory variability, suggesting right atrial pressure of 3 mmHg.   Neuro/Psych negative neurological ROS  negative psych ROS   GI/Hepatic negative GI ROS, GERD  ,(+) Hepatitis - (chronic), C  Endo/Other  negative endocrine ROS  Renal/GU negative Renal ROS  negative genitourinary   Musculoskeletal negative musculoskeletal ROS (+) Arthritis ,   Abdominal Normal abdominal exam  (+)    Peds negative pediatric ROS (+)  Hematology negative hematology ROS (+)   Anesthesia Other Findings Bladder mass  Reproductive/Obstetrics negative OB ROS                            Anesthesia Physical Anesthesia Plan  ASA: 3  Anesthesia Plan: General   Post-op Pain Management:    Induction: Intravenous  PONV Risk Score and Plan:   Airway Management Planned: LMA  Additional Equipment: None  Intra-op Plan:   Post-operative Plan:   Informed Consent: I have reviewed the patients History and Physical, chart, labs and discussed the procedure including the risks, benefits and alternatives for the proposed anesthesia with the patient or authorized representative who has indicated his/her understanding and acceptance.     Dental advisory given  Plan Discussed with: CRNA, Anesthesiologist and Surgeon  Anesthesia Plan Comments:        Anesthesia Quick Evaluation

## 2021-06-17 ENCOUNTER — Ambulatory Visit (HOSPITAL_BASED_OUTPATIENT_CLINIC_OR_DEPARTMENT_OTHER): Payer: Medicare Other | Admitting: Anesthesiology

## 2021-06-17 ENCOUNTER — Ambulatory Visit (HOSPITAL_BASED_OUTPATIENT_CLINIC_OR_DEPARTMENT_OTHER)
Admission: RE | Admit: 2021-06-17 | Discharge: 2021-06-17 | Disposition: A | Discharge status: Home / Self Care | Payer: Medicare Other | Source: Ambulatory Visit | Attending: Urology | Admitting: Urology

## 2021-06-17 ENCOUNTER — Encounter (HOSPITAL_BASED_OUTPATIENT_CLINIC_OR_DEPARTMENT_OTHER)
Admission: RE | Disposition: A | Discharge status: Home / Self Care | Payer: Self-pay | Source: Ambulatory Visit | Attending: Urology

## 2021-06-17 ENCOUNTER — Encounter (HOSPITAL_BASED_OUTPATIENT_CLINIC_OR_DEPARTMENT_OTHER): Payer: Self-pay | Admitting: Urology

## 2021-06-17 ENCOUNTER — Other Ambulatory Visit: Payer: Self-pay

## 2021-06-17 DIAGNOSIS — C675 Malignant neoplasm of bladder neck: Secondary | ICD-10-CM | POA: Insufficient documentation

## 2021-06-17 DIAGNOSIS — Z79899 Other long term (current) drug therapy: Secondary | ICD-10-CM | POA: Diagnosis not present

## 2021-06-17 DIAGNOSIS — R310 Gross hematuria: Secondary | ICD-10-CM | POA: Diagnosis not present

## 2021-06-17 DIAGNOSIS — D414 Neoplasm of uncertain behavior of bladder: Secondary | ICD-10-CM

## 2021-06-17 DIAGNOSIS — D494 Neoplasm of unspecified behavior of bladder: Secondary | ICD-10-CM | POA: Diagnosis not present

## 2021-06-17 DIAGNOSIS — I4891 Unspecified atrial fibrillation: Secondary | ICD-10-CM | POA: Insufficient documentation

## 2021-06-17 DIAGNOSIS — I081 Rheumatic disorders of both mitral and tricuspid valves: Secondary | ICD-10-CM | POA: Diagnosis not present

## 2021-06-17 DIAGNOSIS — Z809 Family history of malignant neoplasm, unspecified: Secondary | ICD-10-CM | POA: Insufficient documentation

## 2021-06-17 DIAGNOSIS — Z8249 Family history of ischemic heart disease and other diseases of the circulatory system: Secondary | ICD-10-CM | POA: Diagnosis not present

## 2021-06-17 DIAGNOSIS — Z87891 Personal history of nicotine dependence: Secondary | ICD-10-CM | POA: Insufficient documentation

## 2021-06-17 DIAGNOSIS — Z7901 Long term (current) use of anticoagulants: Secondary | ICD-10-CM | POA: Insufficient documentation

## 2021-06-17 DIAGNOSIS — K219 Gastro-esophageal reflux disease without esophagitis: Secondary | ICD-10-CM | POA: Diagnosis not present

## 2021-06-17 DIAGNOSIS — J449 Chronic obstructive pulmonary disease, unspecified: Secondary | ICD-10-CM | POA: Diagnosis not present

## 2021-06-17 HISTORY — DX: Long term (current) use of anticoagulants: Z79.01

## 2021-06-17 HISTORY — PX: TRANSURETHRAL RESECTION OF BLADDER TUMOR: SHX2575

## 2021-06-17 HISTORY — DX: Presence of external hearing-aid: Z97.4

## 2021-06-17 HISTORY — DX: Other psychoactive substance abuse, in remission: F19.11

## 2021-06-17 HISTORY — DX: Age-related osteoporosis without current pathological fracture: M81.0

## 2021-06-17 HISTORY — DX: Hematuria, unspecified: R31.9

## 2021-06-17 HISTORY — DX: Presence of dental prosthetic device (complete) (partial): Z97.2

## 2021-06-17 HISTORY — DX: Cholesterolosis of gallbladder: K82.4

## 2021-06-17 HISTORY — DX: Neoplasm of unspecified behavior of bladder: D49.4

## 2021-06-17 HISTORY — DX: Nonrheumatic mitral (valve) insufficiency: I34.0

## 2021-06-17 LAB — COMPREHENSIVE METABOLIC PANEL
ALT: 13 U/L (ref 0–44)
AST: 18 U/L (ref 15–41)
Albumin: 4.4 g/dL (ref 3.5–5.0)
Alkaline Phosphatase: 49 U/L (ref 38–126)
Anion gap: 10 (ref 5–15)
BUN: 11 mg/dL (ref 8–23)
CO2: 26 mmol/L (ref 22–32)
Calcium: 9.4 mg/dL (ref 8.9–10.3)
Chloride: 111 mmol/L (ref 98–111)
Creatinine, Ser: 0.71 mg/dL (ref 0.61–1.24)
GFR, Estimated: >60 mL/min (ref >60)
Glucose, Bld: 95 mg/dL (ref 70–99)
Potassium: 3.8 mmol/L (ref 3.5–5.1)
Sodium: 147 mmol/L — ABNORMAL HIGH (ref 135–145)
Total Bilirubin: 1.5 mg/dL — ABNORMAL HIGH (ref 0.3–1.2)
Total Protein: 7.8 g/dL (ref 6.5–8.1)

## 2021-06-17 SURGERY — TURBT (TRANSURETHRAL RESECTION OF BLADDER TUMOR)
Anesthesia: General | Site: Bladder

## 2021-06-17 MED ORDER — ONDANSETRON HCL 4 MG/2ML IJ SOLN
INTRAMUSCULAR | Status: DC | PRN
  Administered 2021-06-17: 4 mg via INTRAVENOUS

## 2021-06-17 MED ORDER — LIDOCAINE 2% (20 MG/ML) 5 ML SYRINGE
INTRAMUSCULAR | Status: AC
  Filled 2021-06-17: qty 5

## 2021-06-17 MED ORDER — FENTANYL CITRATE (PF) 100 MCG/2ML IJ SOLN
INTRAMUSCULAR | Status: AC
  Filled 2021-06-17: qty 2

## 2021-06-17 MED ORDER — HYDRALAZINE HCL 20 MG/ML IJ SOLN: 10.0000 mg | INTRAMUSCULAR | Status: DC | PRN

## 2021-06-17 MED ORDER — DROPERIDOL 2.5 MG/ML IJ SOLN: 0.6250 mg | Freq: Once | INTRAMUSCULAR | Status: DC | PRN

## 2021-06-17 MED ORDER — BELLADONNA ALKALOIDS-OPIUM 16.2-60 MG RE SUPP
RECTAL | Status: AC
  Filled 2021-06-17: qty 1

## 2021-06-17 MED ORDER — OXYCODONE HCL 5 MG/5ML PO SOLN: 5.0000 mg | Freq: Once | ORAL | Status: DC | PRN

## 2021-06-17 MED ORDER — FENTANYL CITRATE (PF) 100 MCG/2ML IJ SOLN
INTRAMUSCULAR | Status: DC | PRN
  Administered 2021-06-17: 75 ug via INTRAVENOUS
  Administered 2021-06-17: 25 ug via INTRAVENOUS

## 2021-06-17 MED ORDER — ACETAMINOPHEN 500 MG PO TABS
ORAL_TABLET | ORAL | Status: AC
  Filled 2021-06-17: qty 2

## 2021-06-17 MED ORDER — DEXAMETHASONE SODIUM PHOSPHATE 10 MG/ML IJ SOLN
INTRAMUSCULAR | Status: DC | PRN
  Administered 2021-06-17: 5 mg via INTRAVENOUS

## 2021-06-17 MED ORDER — ONDANSETRON HCL 4 MG/2ML IJ SOLN: 4.0000 mg | Freq: Once | INTRAMUSCULAR | Status: DC | PRN

## 2021-06-17 MED ORDER — FENTANYL CITRATE (PF) 100 MCG/2ML IJ SOLN
25.0000 ug | INTRAMUSCULAR | Status: DC | PRN
  Administered 2021-06-17: 25 ug via INTRAVENOUS

## 2021-06-17 MED ORDER — NITROFURANTOIN MONOHYD MACRO 100 MG PO CAPS: 100.0000 mg | ORAL_CAPSULE | Freq: Every day | ORAL | 0 refills | Status: DC

## 2021-06-17 MED ORDER — BELLADONNA ALKALOIDS-OPIUM 16.2-60 MG RE SUPP
RECTAL | Status: DC | PRN
  Administered 2021-06-17: 1 via RECTAL

## 2021-06-17 MED ORDER — LIDOCAINE 2% (20 MG/ML) 5 ML SYRINGE
INTRAMUSCULAR | Status: DC | PRN
  Administered 2021-06-17: 80 mg via INTRAVENOUS

## 2021-06-17 MED ORDER — PROPOFOL 10 MG/ML IV BOLUS
INTRAVENOUS | Status: DC | PRN
  Administered 2021-06-17: 150 mg via INTRAVENOUS

## 2021-06-17 MED ORDER — CEFAZOLIN SODIUM-DEXTROSE 2-4 GM/100ML-% IV SOLN
INTRAVENOUS | Status: AC
  Filled 2021-06-17: qty 100

## 2021-06-17 MED ORDER — PROPOFOL 10 MG/ML IV BOLUS
INTRAVENOUS | Status: AC
  Filled 2021-06-17: qty 20

## 2021-06-17 MED ORDER — APIXABAN 5 MG PO TABS: 5.0000 mg | ORAL_TABLET | Freq: Two times a day (BID) | ORAL | 0 refills | Status: DC

## 2021-06-17 MED ORDER — OXYCODONE HCL 5 MG PO TABS: 5.0000 mg | ORAL_TABLET | Freq: Once | ORAL | Status: DC | PRN

## 2021-06-17 MED ORDER — CEFAZOLIN SODIUM-DEXTROSE 2-4 GM/100ML-% IV SOLN
2.0000 g | INTRAVENOUS | Status: AC
  Administered 2021-06-17: 2 g via INTRAVENOUS

## 2021-06-17 MED ORDER — STERILE WATER FOR IRRIGATION IR SOLN
Status: DC | PRN
  Administered 2021-06-17: 500 mL

## 2021-06-17 MED ORDER — GEMCITABINE CHEMO FOR BLADDER INSTILLATION 2000 MG
2000.0000 mg | Freq: Once | INTRAVENOUS | Status: AC
Start: 2021-06-17 — End: 2021-06-17
  Administered 2021-06-17: 2000 mg via INTRAVESICAL
  Filled 2021-06-17: qty 2000

## 2021-06-17 MED ORDER — LACTATED RINGERS IV SOLN: INTRAVENOUS | Status: DC

## 2021-06-17 MED ORDER — SODIUM CHLORIDE 0.9 % IR SOLN
Status: DC | PRN
  Administered 2021-06-17 (×3): 3000 mL via INTRAVESICAL

## 2021-06-17 MED ORDER — ACETAMINOPHEN 500 MG PO TABS
1000.0000 mg | ORAL_TABLET | Freq: Once | ORAL | Status: AC
  Administered 2021-06-17: 1000 mg via ORAL

## 2021-06-17 SURGICAL SUPPLY — 30 items
BAG DRAIN URO-CYSTO SKYTR STRL (DRAIN) ×2 IMPLANT
BAG DRN RND TRDRP ANRFLXCHMBR (UROLOGICAL SUPPLIES)
BAG DRN UROCATH (DRAIN) ×1
BAG URINE DRAIN 2000ML AR STRL (UROLOGICAL SUPPLIES) IMPLANT
BAG URINE LEG 500ML (DRAIN) IMPLANT
CATH FOLEY 2WAY SLVR  5CC 18FR (CATHETERS) ×2
CATH FOLEY 2WAY SLVR  5CC 20FR (CATHETERS)
CATH FOLEY 2WAY SLVR  5CC 22FR (CATHETERS)
CATH FOLEY 2WAY SLVR 5CC 18FR (CATHETERS) ×1 IMPLANT
CATH FOLEY 2WAY SLVR 5CC 20FR (CATHETERS) IMPLANT
CATH FOLEY 2WAY SLVR 5CC 22FR (CATHETERS) IMPLANT
CATH URET 5FR 28IN OPEN ENDED (CATHETERS) IMPLANT
CLOTH BEACON ORANGE TIMEOUT ST (SAFETY) ×2 IMPLANT
DRSG TELFA 3X8 NADH (GAUZE/BANDAGES/DRESSINGS) IMPLANT
ELECT REM PT RETURN 9FT ADLT (ELECTROSURGICAL) ×2
ELECTRODE REM PT RTRN 9FT ADLT (ELECTROSURGICAL) ×1 IMPLANT
EVACUATOR MICROVAS BLADDER (UROLOGICAL SUPPLIES) IMPLANT
GLOVE SURG ENC MOIS LTX SZ7.5 (GLOVE) ×2 IMPLANT
GLOVE SURG ENC MOIS LTX SZ8 (GLOVE) ×1 IMPLANT
GOWN STRL REUS W/TWL LRG LVL3 (GOWN DISPOSABLE) ×2 IMPLANT
HOLDER FOLEY CATH W/STRAP (MISCELLANEOUS) IMPLANT
IV NS IRRIG 3000ML ARTHROMATIC (IV SOLUTION) ×6 IMPLANT
KIT TURNOVER CYSTO (KITS) ×2 IMPLANT
LOOP CUT BIPOLAR 24F LRG (ELECTROSURGICAL) IMPLANT
MANIFOLD NEPTUNE II (INSTRUMENTS) ×2 IMPLANT
PACK CYSTO (CUSTOM PROCEDURE TRAY) ×2 IMPLANT
PLUG CATH AND CAP STER (CATHETERS) IMPLANT
TUBE CONNECTING 12X1/4 (SUCTIONS) ×2 IMPLANT
TUBING UROLOGY SET (TUBING) ×2 IMPLANT
WATER STERILE IRR 500ML POUR (IV SOLUTION) ×2 IMPLANT
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
## 2021-06-17 NOTE — Anesthesia Procedure Notes (Signed)
Procedure Name: LMA Insertion Date/Time: 06/17/2021 7:37 AM Performed by: Suan Halter, CRNA Pre-anesthesia Checklist: Patient identified, Emergency Drugs available, Suction available and Patient being monitored Patient Re-evaluated:Patient Re-evaluated prior to induction Oxygen Delivery Method: Circle system utilized Preoxygenation: Pre-oxygenation with 100% oxygen Induction Type: IV induction Ventilation: Mask ventilation without difficulty LMA: LMA inserted LMA Size: 5.0 Number of attempts: 1 Airway Equipment and Method: Bite block Placement Confirmation: positive ETCO2 Tube secured with: Tape Dental Injury: Teeth and Oropharynx as per pre-operative assessment

## 2021-06-17 NOTE — Transfer of Care (Signed)
Immediate Anesthesia Transfer of Care Note  Patient: Kenneth [name]  Procedure(s) Performed: Procedure(s) (LRB): TRANSURETHRAL RESECTION OF BLADDER TUMOR (TURBT) WITH POST OPERATIVE INSTILLATION OF GEMCITABINE (N/A)  Patient Location: PACU  Anesthesia Type: General  Level of Consciousness: awake, oriented, sedated and patient cooperative  Airway & Oxygen Therapy: Patient Spontanous Breathing and Patient connected to face mask oxygen  Post-op Assessment: Report given to PACU RN and Post -op Vital signs reviewed and stable  Post vital signs: Reviewed and stable  Complications: No apparent anesthesia complications Last Vitals:  Vitals Value Taken Time  BP 132/85 06/17/21 0826  Temp    Pulse 56 06/17/21 0826  Resp 8 06/17/21 0826  SpO2 95 % 06/17/21 0826  Vitals shown include unvalidated device data.  Last Pain:  Vitals:   06/17/21 0607  TempSrc: Oral  PainSc: 0-No pain      Patients Stated Pain Goal: 5 ([id number])  Complications: No notable events documented.

## 2021-06-17 NOTE — Op Note (Addendum)
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  CT scan revealed a 14 mm bladder tumor which on cystoscopy was at the right anterior bladder neck.  Findings: On exam under anesthesia the penis was circumcised and without mass or lesion.  Testicles were descended bilaterally and palpably normal.  Prostate was about 30 g and smooth without hard area or nodule.  No palpable bladder mass.  On cystoscopy the urethra was unremarkable, prostatic urethra unremarkable with some lateral lobe hypertrophy.  In the bladder there was a high-grade dense appearing papillary tumor from about 9:00 to 12:00 right anterior bladder neck.  Resection site about 3 cm.  Clinically it did appear to be broad-based and invasive, but I was very pleased with the resection.  Description of procedure: After consent was obtained patient brought to the operating room.  After adequate anesthesia he was placed lithotomy position and prepped and draped in usual sterile fashion.  Timeout was performed to current the patient and procedure.  Cystoscope was passed per urethra the bladder was inspected with a 30 degree and 70 degree lens.  No other mucosal lesions were noted.  The scope was backed out and the continuous-flow sheath inserted with the visual obturator and then the loop and handle.  I started at 12:00 and resected the tumor down to its base.  I kept pressure on the suprapubic area with the left hand to get a good resection.  This was sent as bladder neck tumor.  I then resected the base and sent that separately.  Resection site was fulgurated and hemostasis was excellent at low pressure.  All the chips were evacuated.  Scope was removed and an 15 [place] two-way  catheter was passed left to gravity drainage with clear urine.  I did an exam under anesthesia and placed a B&O suppository.  He was awakened, taken the cover room in stable condition.  Postoperative instillation of gemcitabine in PACU: Gemcitabine was instilled per urethra in the PACU for a total of 55 minutes and then drained.  Blood loss: 30 mL  Drains: 18 French Foley catheter  Complications: None  Specimens to pathology: #1 anterior bladder neck tumor #2 anterior bladder neck tumor base  Disposition: Patient stable to PACU-I discussed with his friend Ben-the procedure, postop care and follow-up.

## 2021-06-17 NOTE — Anesthesia Postprocedure Evaluation (Signed)
Anesthesia Post Note  Patient: Kenneth [name]  Procedure(s) Performed: TRANSURETHRAL RESECTION OF BLADDER TUMOR (TURBT) WITH POST OPERATIVE INSTILLATION OF GEMCITABINE (Bladder)     Patient location during evaluation: PACU Anesthesia Type: General Level of consciousness: awake and alert Pain management: pain level controlled Vital Signs Assessment: post-procedure vital signs reviewed and stable Respiratory status: spontaneous breathing, nonlabored ventilation and respiratory function stable Cardiovascular status: blood pressure returned to baseline and stable Postop Assessment: no apparent nausea or vomiting Anesthetic complications: no   No notable events documented.  Last Vitals:  Vitals:   06/17/21 1015 06/17/21 1110  BP: (!) 147/118 (!) 144/108  Pulse: 60 68  Resp: 17 14  Temp:  (!) 36.3 C  SpO2: 93% 96%    Last Pain:  Vitals:   06/17/21 1110  TempSrc:   PainSc: 0-No pain                 Merlinda Frederick

## 2021-06-17 NOTE — Discharge Instructions (Addendum)
Indwelling Urinary Catheter Care, Adult An indwelling urinary catheter is a thin tube that is put into your bladder. The tube helps to drain pee (urine) out of your body. The tube goes in through your urethra. Your urethra is where pee comes out of your body. Your pee will come out through the catheter, then it will go into a bag (drainage bag). Take good care of your catheter so it will work well.  Removal of the catheter: Remove the Foley catheter on Tuesday morning June 21, 2021 as instructed.  How to wear your catheter and bag Supplies needed Sticky tape (adhesive tape) or a leg strap. Alcohol wipe or soap and water (if you use tape). A clean towel (if you use tape). Large overnight bag. Smaller bag (leg bag). Wearing your catheter Attach your catheter to your leg with tape or a leg strap. Make sure the catheter is not pulled tight. If a leg strap gets wet, take it off and put on a dry strap. If you use tape to hold the bag on your leg: Use an alcohol wipe or soap and water to wash your skin where the tape made it sticky before. Use a clean towel to pat-dry that skin. Use new tape to make the bag stay on your leg. Wearing your bags You should have been given a large overnight bag. You may wear the overnight bag in the day or night. Always have the overnight bag lower than your bladder.  Do not let the bag touch the floor. Before you go to sleep, put a clean plastic bag in a wastebasket. Then hang the overnight bag inside the wastebasket. You should also have a smaller leg bag that fits under your clothes. Always wear the leg bag below your knee. Do not wear your leg bag at night. How to care for your skin and catheter Supplies needed A clean washcloth. Water and mild soap. A clean towel. Caring for your skin and catheter    Clean the skin around your catheter every day: Wash your hands with soap and water. Wet a clean washcloth in warm water and mild soap. Clean the  skin around your urethra. If you are male: Gently spread the folds of skin around your vagina (labia). With the washcloth in your other hand, wipe the inner side of your labia on each side. Wipe from front to back. If you are male: Pull back any skin that covers the end of your penis (foreskin). With the washcloth in your other hand, wipe your penis in small circles. Start wiping at the tip of your penis, then move away from the catheter. Move the foreskin back in place, if needed. With your free hand, hold the catheter close to where it goes into your body. Keep holding the catheter during cleaning so it does not get pulled out. With the washcloth in your other hand, clean the catheter. Only wipe downward on the catheter, toward the drainage bag. Do not wipe upward toward your body. Doing this may push germs into your urethra and cause infection. Use a clean towel to pat-dry the catheter and the skin around it. Make sure to wipe off all soap. Wash your hands with soap and water. Shower every day. Do not take baths. Do not use cream, ointment, or lotion on the area where the catheter goes into your body, unless your doctor tells you to. Do not use powders, sprays, or lotions on your genital area. Check your skin around the catheter  every day for signs of infection. Check for: Redness, swelling, or pain. Fluid or blood. Warmth. Pus or a bad smell. How to empty the bag Supplies needed Rubbing alcohol. Gauze pad or cotton ball. Tape or a leg strap. Emptying the bag Pour the pee out of your bag when it is ?- full, or at least 2-3 times a day. Do this for your overnight bag and your leg bag. Wash your hands with soap and water. Separate (detach) the bag from your leg. Hold the bag over the toilet or a clean pail. Keep the bag lower than your hips and bladder. This is so the pee (urine) does not go back into the tube. Open the pour spout. It is at the bottom of the bag. Empty the pee  into the toilet or pail. Do not let the pour spout touch any surface. Put rubbing alcohol on a gauze pad or cotton ball. Use the gauze pad or cotton ball to clean the pour spout. Close the pour spout. Attach the bag to your leg with tape or a leg strap. Wash your hands with soap and water. Follow instructions for cleaning the drainage bag: From the product maker. As told by your doctor. How to change the bag Changing the bag Replace your bag when it starts to leak, smell bad, or look dirty. Wash your hands with soap and water. Separate the dirty bag from your leg. Pinch the catheter with your fingers so that pee does not spill out. Separate the catheter tube from the bag tube where these tubes connect (at the connection valve). Do not let the tubes touch any surface. Clean the end of the catheter tube with an alcohol wipe. Use a different alcohol wipe to clean the end of the bag tube. Connect the catheter tube to the tube of the clean bag. Attach the clean bag to your leg with tape or a leg strap. Do not make the bag tight on your leg. Wash your hands with soap and water. General rules Never pull on your catheter. Never try to take it out. Doing that can hurt you. Always wash your hands before and after you touch your catheter or bag. Use a mild, fragrance-free soap. If you do not have soap and water, use hand sanitizer. Always make sure there are no twists or bends (kinks) in the catheter tube. Always make sure there are no leaks in the catheter or bag. Drink enough fluid to keep your pee pale yellow. Do not take baths, swim, or use a hot tub. If you are male, wipe from front to back after you poop (have a bowel movement). Contact a doctor if: Your pee is cloudy. Your pee smells worse than usual. Your catheter gets clogged. Your catheter leaks. Your bladder feels full. Get help right away if: You have redness, swelling, or pain where the catheter goes into your body. You have  fluid, blood, pus, or a bad smell coming from the area where the catheter goes into your body. Your skin feels warm where the catheter goes into your body. You have a fever. You have pain in your: Belly (abdomen). Legs. Lower back. Bladder. You see blood in the catheter. Your pee is pink or red. You feel sick to your stomach (nauseous). You throw up (vomit). You have chills. Your pee is not draining into the bag. Your catheter gets pulled out. Summary An indwelling urinary catheter is a thin tube that is placed into the bladder to help drain  pee (urine) out of the body. The catheter is placed into the part of the body that drains pee from the bladder (urethra). Taking good care of your catheter will keep it working properly and help prevent problems. Always wash your hands before and after touching your catheter or bag. Never pull on your catheter or try to take it out. This information is not intended to replace advice given to you by your health care provider. Make sure you discuss any questions you have with your health care provider. Document Revised: 12/15/2020 Document Reviewed: 09/10/2020 Elsevier Patient Education  [place] Instructions  Activity: Get plenty of rest for the remainder of the day. A responsible individual must stay with you for 24 hours following the procedure.  For the next 24 hours, DO NOT: -Drive a car -Paediatric nurse -Drink alcoholic beverages -Take any medication unless instructed by your physician -Make any legal decisions or sign important papers.  Meals: Start with liquid foods such as gelatin or soup. Progress to regular foods as tolerated. Avoid greasy, spicy, heavy foods. If nausea and/or vomiting occur, drink only clear liquids until the nausea and/or vomiting subsides. Call your physician if vomiting continues.  Special Instructions/Symptoms: Your throat may feel dry or sore from the  anesthesia or the breathing tube placed in your throat during surgery. If this causes discomfort, gargle with warm salt water. The discomfort should disappear within 24 hours.

## 2021-06-20 ENCOUNTER — Encounter (HOSPITAL_BASED_OUTPATIENT_CLINIC_OR_DEPARTMENT_OTHER): Payer: Self-pay | Admitting: Urology

## 2021-06-21 LAB — SURGICAL PATHOLOGY

## 2021-06-27 ENCOUNTER — Telehealth: Payer: Self-pay | Admitting: Urology

## 2021-06-27 DIAGNOSIS — K824 Cholesterolosis of gallbladder: Secondary | ICD-10-CM | POA: Diagnosis not present

## 2021-06-27 NOTE — Telephone Encounter (Signed)
Returned patient call. Patient is [facility] patient.

## 2021-06-27 NOTE — Telephone Encounter (Signed)
Inbound call from the patient concerned that he has yet to be contacted w/results from his recent bladder resection w/Dr. Junious Silk on 06/17/21.  Also, he states he is supposed to f/up w/Dr. Junious Silk on 07/08/21 @ 10:30, however there is no documentation w/in his chart of this appointment desk.  Please advise by calling back @ [phone].  Thank you

## 2021-06-29 DIAGNOSIS — I10 Essential (primary) hypertension: Secondary | ICD-10-CM | POA: Diagnosis not present

## 2021-06-29 DIAGNOSIS — Z2821 Immunization not carried out because of patient refusal: Secondary | ICD-10-CM | POA: Diagnosis not present

## 2021-06-29 DIAGNOSIS — R319 Hematuria, unspecified: Secondary | ICD-10-CM | POA: Diagnosis not present

## 2021-06-29 DIAGNOSIS — Z299 Encounter for prophylactic measures, unspecified: Secondary | ICD-10-CM | POA: Diagnosis not present

## 2021-06-29 DIAGNOSIS — C679 Malignant neoplasm of bladder, unspecified: Secondary | ICD-10-CM | POA: Diagnosis not present

## 2021-07-07 ENCOUNTER — Ambulatory Visit: Payer: Medicare Other | Admitting: Cardiology

## 2021-07-08 DIAGNOSIS — C673 Malignant neoplasm of anterior wall of bladder: Secondary | ICD-10-CM | POA: Diagnosis not present

## 2021-07-12 ENCOUNTER — Telehealth: Payer: Self-pay | Admitting: Oncology

## 2021-07-12 NOTE — Telephone Encounter (Signed)
Scheduled appt per 10/4 referral. Pt is aware of appt date and time.

## 2021-07-13 DIAGNOSIS — I10 Essential (primary) hypertension: Secondary | ICD-10-CM | POA: Diagnosis not present

## 2021-07-13 DIAGNOSIS — M542 Cervicalgia: Secondary | ICD-10-CM | POA: Diagnosis not present

## 2021-07-13 DIAGNOSIS — J449 Chronic obstructive pulmonary disease, unspecified: Secondary | ICD-10-CM | POA: Diagnosis not present

## 2021-07-13 DIAGNOSIS — I4891 Unspecified atrial fibrillation: Secondary | ICD-10-CM | POA: Diagnosis not present

## 2021-07-13 DIAGNOSIS — Z299 Encounter for prophylactic measures, unspecified: Secondary | ICD-10-CM | POA: Diagnosis not present

## 2021-07-14 ENCOUNTER — Other Ambulatory Visit: Payer: Self-pay | Admitting: Urology

## 2021-07-14 ENCOUNTER — Inpatient Hospital Stay: Payer: Medicare Other | Attending: Oncology | Admitting: Oncology

## 2021-07-14 ENCOUNTER — Other Ambulatory Visit: Payer: Self-pay

## 2021-07-14 VITALS — BP 146/89 | HR 84 | Temp 97.6°F | Resp 18 | Ht 72.0 in | Wt 194.5 lb

## 2021-07-14 DIAGNOSIS — C670 Malignant neoplasm of trigone of bladder: Secondary | ICD-10-CM | POA: Insufficient documentation

## 2021-07-14 DIAGNOSIS — C679 Malignant neoplasm of bladder, unspecified: Secondary | ICD-10-CM | POA: Insufficient documentation

## 2021-07-14 DIAGNOSIS — I4891 Unspecified atrial fibrillation: Secondary | ICD-10-CM | POA: Diagnosis not present

## 2021-07-14 DIAGNOSIS — Z87891 Personal history of nicotine dependence: Secondary | ICD-10-CM

## 2021-07-14 DIAGNOSIS — B1920 Unspecified viral hepatitis C without hepatic coma: Secondary | ICD-10-CM | POA: Diagnosis not present

## 2021-07-14 MED ORDER — PROCHLORPERAZINE MALEATE 10 MG PO TABS: 10.0000 mg | ORAL_TABLET | Freq: Four times a day (QID) | ORAL | 0 refills | Status: DC | PRN

## 2021-07-14 NOTE — Progress Notes (Signed)
START ON PATHWAY REGIMEN - Bladder     A cycle is every 7 days:     Cisplatin   **Always confirm dose/schedule in your pharmacy ordering system**  Patient Characteristics: Pre-Cystectomy or Nonsurgical Candidate (Clinical Staging), cT2-4a, cN0-1, M0, Bladder-Sparing Approach Therapeutic Status: Pre-Cystectomy or Nonsurgical Candidate (Clinical Staging) AJCC M Category: cM0 AJCC 8 Stage Grouping: II AJCC T Category: cT2 AJCC N Category: cN0 Intent of Therapy: Curative Intent, Discussed with Patient
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
## 2021-07-19 ENCOUNTER — Encounter: Payer: Self-pay | Admitting: Oncology

## 2021-07-19 DIAGNOSIS — B182 Chronic viral hepatitis C: Secondary | ICD-10-CM | POA: Diagnosis not present

## 2021-07-19 DIAGNOSIS — H7011 Chronic mastoiditis, right ear: Secondary | ICD-10-CM | POA: Diagnosis not present

## 2021-07-20 DIAGNOSIS — H40013 Open angle with borderline findings, low risk, bilateral: Secondary | ICD-10-CM | POA: Diagnosis not present

## 2021-07-20 DIAGNOSIS — H40053 Ocular hypertension, bilateral: Secondary | ICD-10-CM | POA: Diagnosis not present

## 2021-07-22 ENCOUNTER — Inpatient Hospital Stay: Payer: Medicare Other

## 2021-07-22 ENCOUNTER — Telehealth: Payer: Self-pay

## 2021-07-22 ENCOUNTER — Other Ambulatory Visit: Payer: Self-pay

## 2021-07-22 ENCOUNTER — Encounter: Payer: Self-pay | Admitting: Oncology

## 2021-07-22 LAB — COMPREHENSIVE METABOLIC PANEL
AG Ratio: 1.7 (calc) (ref 1.0–2.5)
ALT: 19 U/L (ref 9–46)
AST: 19 U/L (ref 10–35)
Albumin: 4.3 g/dL (ref 3.6–5.1)
Alkaline phosphatase (APISO): 51 U/L (ref 35–144)
BUN: 14 mg/dL (ref 7–25)
CO2: 28 mmol/L (ref 20–32)
Calcium: 9.3 mg/dL (ref 8.6–10.3)
Chloride: 105 mmol/L (ref 98–110)
Creat: 0.78 mg/dL (ref 0.70–1.28)
Globulin: 2.6 g/dL (calc) (ref 1.9–3.7)
Glucose, Bld: 104 mg/dL — ABNORMAL HIGH (ref 65–99)
Potassium: 4.4 mmol/L (ref 3.5–5.3)
Sodium: 143 mmol/L (ref 135–146)
Total Bilirubin: 1.1 mg/dL (ref 0.2–1.2)
Total Protein: 6.9 g/dL (ref 6.1–8.1)

## 2021-07-22 LAB — HCV RNA, QUANT REAL-TIME PCR W/REFLEX
HCV RNA, PCR, QN (Log): <1.18 {Log_IU}/mL
HCV RNA, PCR, QN: <15 [IU]/mL

## 2021-07-22 NOTE — Telephone Encounter (Signed)
Spoke with Darrick Meigs with transportation services. [place] is covered for transportation by the cone service. Enrolled Mr Wollman in the program.  He will call the service to set up rides to [facility] once he knows his schedule.  He will reveive radiation and chemotherapy at [facility] in [place]. Faxed signed Guy Franco waiver and release of liability to [place] at [phone]

## 2021-07-26 ENCOUNTER — Ambulatory Visit
Admission: RE | Admit: 2021-07-26 | Discharge: 2021-07-26 | Disposition: A | Discharge status: Home / Self Care | Payer: Medicare Other | Source: Ambulatory Visit | Attending: Radiation Oncology | Admitting: Radiation Oncology

## 2021-07-26 ENCOUNTER — Other Ambulatory Visit: Payer: Self-pay

## 2021-07-26 ENCOUNTER — Encounter: Payer: Self-pay | Admitting: Radiation Oncology

## 2021-07-26 VITALS — HR 110 | Temp 97.1°F | Resp 20 | Wt 197.0 lb

## 2021-07-26 DIAGNOSIS — C670 Malignant neoplasm of trigone of bladder: Secondary | ICD-10-CM | POA: Diagnosis not present

## 2021-07-26 DIAGNOSIS — Z79899 Other long term (current) drug therapy: Secondary | ICD-10-CM | POA: Insufficient documentation

## 2021-07-26 DIAGNOSIS — C678 Malignant neoplasm of overlapping sites of bladder: Secondary | ICD-10-CM | POA: Diagnosis not present

## 2021-07-26 DIAGNOSIS — C679 Malignant neoplasm of bladder, unspecified: Secondary | ICD-10-CM

## 2021-07-26 DIAGNOSIS — J449 Chronic obstructive pulmonary disease, unspecified: Secondary | ICD-10-CM | POA: Diagnosis not present

## 2021-07-26 DIAGNOSIS — K219 Gastro-esophageal reflux disease without esophagitis: Secondary | ICD-10-CM | POA: Insufficient documentation

## 2021-07-26 DIAGNOSIS — M810 Age-related osteoporosis without current pathological fracture: Secondary | ICD-10-CM | POA: Insufficient documentation

## 2021-07-26 DIAGNOSIS — I340 Nonrheumatic mitral (valve) insufficiency: Secondary | ICD-10-CM | POA: Diagnosis not present

## 2021-07-26 DIAGNOSIS — I4891 Unspecified atrial fibrillation: Secondary | ICD-10-CM | POA: Diagnosis not present

## 2021-07-26 DIAGNOSIS — Z7901 Long term (current) use of anticoagulants: Secondary | ICD-10-CM | POA: Diagnosis not present

## 2021-07-26 DIAGNOSIS — R011 Cardiac murmur, unspecified: Secondary | ICD-10-CM | POA: Diagnosis not present

## 2021-07-26 DIAGNOSIS — Z8619 Personal history of other infectious and parasitic diseases: Secondary | ICD-10-CM | POA: Diagnosis not present
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
## 2021-07-26 NOTE — Progress Notes (Signed)
GU Location of Tumor / Histology: Bladder Ca  Past/Anticipated interventions by urology, if any:   Past/Anticipated interventions by medical oncology, if any:   Dr. Alen Blew Assessment and Plan:   70 year old with:  1.  Bladder cancer diagnosed in September 2022 after presenting with hematuria and found to have bladder neck tumor that is multifocal with muscle invasion.  CT scan showed a 1.4 cm mass along the right floor of the urinary bladder without any evidence of metastatic disease or hydronephrosis.   Management options were reviewed at this time for his muscle invasive bladder cancer.  Neoadjuvant chemotherapy followed by radical cystectomy versus definitive therapy with radiation concomitantly with chemotherapy were reviewed at this time.  Complication associated with both treatments and success rate were reviewed at this time.   The logistics of chemotherapy administration with her in the neoadjuvant setting or concomitantly with radiation were discussed.  His complications that include nausea, vomiting, myelosuppression, neutropenia and possible sepsis among others were reiterated.   At this time he opted against a radical cystectomy and would like to proceed with a bladder preservation option.  He will receive radiation with weekly cisplatin after chemo education class.     2.  IV access: Risks and benefits of using Port-A-Cath versus peripheral veins was discussed today.  Complication associated with Port-A-Cath insertion include bleeding, infection and thrombosis.  After discussing the risks and benefits, peripheral veins will be in use.   3.  Antiemetics: Prescription for Compazine was made available to him.   4.  Renal function surveillance: Baseline kidney function is normal we will continue to monitor on therapy.   5.  Goals of care: Treatment is curative at this time.   6.  Follow-up: will be in the near future to start treatment.   60  minutes were dedicated to this  visit. The time was spent on reviewing laboratory data, imaging studies, discussing treatment options, and answering questions regarding future plan.   Weight changes, if any:  No changes in last 90 days.  IPSS:  6 SHIM:  5  Bowel/Bladder complaints, if any:  No bowel or bladder issues at this time. No bleeding since procedure at urologist.  Nausea/Vomiting, if any:  No  Pain issues, if any:  0/10 scale  SAFETY ISSUES: Prior radiation?  No Pacemaker/ICD? No Possible current pregnancy?   Male Is the patient on methotrexate? No  Current Complaints / other details:  Needing more information on treatment options.

## 2021-07-27 ENCOUNTER — Telehealth: Payer: Self-pay | Admitting: *Deleted

## 2021-07-27 NOTE — Telephone Encounter (Signed)
RETURNED PATIENT'S PHONE CALL, SPOKE WITH PATIENT. ?

## 2021-07-29 DIAGNOSIS — R361 Hematospermia: Secondary | ICD-10-CM | POA: Diagnosis not present

## 2021-07-29 DIAGNOSIS — Z6826 Body mass index (BMI) 26.0-26.9, adult: Secondary | ICD-10-CM | POA: Diagnosis not present

## 2021-07-29 DIAGNOSIS — R109 Unspecified abdominal pain: Secondary | ICD-10-CM | POA: Diagnosis not present

## 2021-07-29 DIAGNOSIS — U071 COVID-19: Secondary | ICD-10-CM | POA: Diagnosis not present

## 2021-07-29 DIAGNOSIS — Z299 Encounter for prophylactic measures, unspecified: Secondary | ICD-10-CM | POA: Diagnosis not present

## 2021-08-08 DIAGNOSIS — Z299 Encounter for prophylactic measures, unspecified: Secondary | ICD-10-CM | POA: Diagnosis not present

## 2021-08-08 DIAGNOSIS — Z6826 Body mass index (BMI) 26.0-26.9, adult: Secondary | ICD-10-CM | POA: Diagnosis not present

## 2021-08-08 DIAGNOSIS — B370 Candidal stomatitis: Secondary | ICD-10-CM | POA: Diagnosis not present

## 2021-08-08 DIAGNOSIS — J209 Acute bronchitis, unspecified: Secondary | ICD-10-CM | POA: Diagnosis not present

## 2021-08-10 ENCOUNTER — Encounter (HOSPITAL_BASED_OUTPATIENT_CLINIC_OR_DEPARTMENT_OTHER): Payer: Self-pay | Admitting: Urology

## 2021-08-10 ENCOUNTER — Other Ambulatory Visit: Payer: Self-pay

## 2021-08-10 NOTE — Progress Notes (Addendum)
ADDENDUM:  received pt's pcp office note via fax, placed on chart.  ADDENDUM:   Spoke w/ Kenneth [name] via phone , stated get pt's pcp note stating he was being treated for covid faxed then pt can proceed with surgery. Called and requested from pt's pcp, Dr Woody Seller , office for pt's 07-28-2021 office virtual visit note to be faxed. Will place on chart when received.   Spoke w/ via phone for pre-op interview--- pt Lab needs dos---- no              Lab results------ current ekg in epic/ chart ;  and has current cmp lab result dated 07-19-2021 in epic  COVID test -- pt stated had positive home covid test 07-28-2021 symptoms (cough, not feeling good, fever 07-29-2021 resolved after 2 days)  started 07-26-2021 and pcp gave antiviral oral medication for 5 days.  Then started feeling bad again with sore throat without fever on 08-08-2021 had another positive home covid test and called his pcp again , was prescribed magic mouthwash.  Today 08-10-2021 pt stated throat feels better and cough is better.  Will speak w/ Norris Cross AD @[facility]  to see if pt ok to proceed with surgery 08-16-2021 since this will be day 21 (which is past [place] guidelines).  Arrive at ------- 0715 on 08-16-2021 NPO after MN NO Solid Food.  Clear liquids from MN until--- 0615 Med rec completed Medications to take morning of surgery ----- prilosec Diabetic medication ----- n/a Patient instructed no nail polish to be worn day of surgery Patient instructed to bring photo id and insurance card day of surgery Patient aware to have Driver (ride ) / caregiver   for 24 hours after surgery -- friend, ben edwards  Patient Special Instructions ----- to call pt back 08-11-2021 to let him know if have the ok to proceed with his surgery  Pre-Op special Istructions ----- per special needs on case posting, office stated pt hold eliquis 3 days prior surgery.  However, pt stated that he started back taking eliquis after last surgery 06-17-2021  hematuria started again, was given instructions lower dose but still hematuria.  So per pt he called and spoke w/ his cardiologist since pt wants to just not start back again until after bladder cancer treatment stated cardiologist gave advise about the risk but understood.  So pt's lose dose approx 06-20-2021.  Patient verbalized understanding of instructions that were given at this phone interview. Patient denies shortness of breath, chest pain, fever, cough at this phone interview.    Anesthesia Review: unspecified AFib; hx hep c, completed 12 treatment 04-14-2021, hcv lab undetectable 04-21-2021;  pt denies cardiac s&s, sob, and no peripheral swelling.  PCP: Dr Woody Seller (per pt had virtual visit 08-08-2021) Cardiologist : Dr Harl Bowie [facility] 06-07-2021 epic) Oncologist:  Dr Alen Blew  Dr Tammi Klippel Chest x-ray : no EKG : 01-03-2021 epic Echo : 02-10-2021 epic Stress test: no Cardiac Cath :  no Activity level: denies sob w/ any activity Sleep Study/ CPAP : no  Blood Thinner/ Instructions /Last Dose: Eliquis.  Currently not taking , see above. ASA / Instructions/ Last Dose :  no

## 2021-08-12 DIAGNOSIS — I10 Essential (primary) hypertension: Secondary | ICD-10-CM | POA: Diagnosis not present

## 2021-08-12 DIAGNOSIS — Z299 Encounter for prophylactic measures, unspecified: Secondary | ICD-10-CM | POA: Diagnosis not present

## 2021-08-12 DIAGNOSIS — M25511 Pain in right shoulder: Secondary | ICD-10-CM | POA: Diagnosis not present

## 2021-08-16 ENCOUNTER — Encounter (HOSPITAL_BASED_OUTPATIENT_CLINIC_OR_DEPARTMENT_OTHER): Payer: Self-pay | Admitting: Urology

## 2021-08-16 ENCOUNTER — Ambulatory Visit (HOSPITAL_BASED_OUTPATIENT_CLINIC_OR_DEPARTMENT_OTHER)
Admission: RE | Admit: 2021-08-16 | Discharge: 2021-08-16 | Disposition: A | Discharge status: Home / Self Care | Payer: Medicare Other | Attending: Urology | Admitting: Urology

## 2021-08-16 ENCOUNTER — Other Ambulatory Visit: Payer: Self-pay

## 2021-08-16 ENCOUNTER — Encounter (HOSPITAL_BASED_OUTPATIENT_CLINIC_OR_DEPARTMENT_OTHER)
Admission: RE | Disposition: A | Discharge status: Home / Self Care | Payer: Self-pay | Source: Home / Self Care | Attending: Urology

## 2021-08-16 ENCOUNTER — Ambulatory Visit (HOSPITAL_BASED_OUTPATIENT_CLINIC_OR_DEPARTMENT_OTHER): Payer: Medicare Other | Admitting: Anesthesiology

## 2021-08-16 DIAGNOSIS — K824 Cholesterolosis of gallbladder: Secondary | ICD-10-CM | POA: Diagnosis not present

## 2021-08-16 DIAGNOSIS — Z7901 Long term (current) use of anticoagulants: Secondary | ICD-10-CM | POA: Insufficient documentation

## 2021-08-16 DIAGNOSIS — Z8551 Personal history of malignant neoplasm of bladder: Secondary | ICD-10-CM | POA: Diagnosis not present

## 2021-08-16 DIAGNOSIS — I4891 Unspecified atrial fibrillation: Secondary | ICD-10-CM | POA: Diagnosis not present

## 2021-08-16 DIAGNOSIS — C675 Malignant neoplasm of bladder neck: Secondary | ICD-10-CM | POA: Insufficient documentation

## 2021-08-16 DIAGNOSIS — I081 Rheumatic disorders of both mitral and tricuspid valves: Secondary | ICD-10-CM | POA: Insufficient documentation

## 2021-08-16 DIAGNOSIS — J449 Chronic obstructive pulmonary disease, unspecified: Secondary | ICD-10-CM | POA: Insufficient documentation

## 2021-08-16 DIAGNOSIS — C679 Malignant neoplasm of bladder, unspecified: Secondary | ICD-10-CM | POA: Diagnosis not present

## 2021-08-16 DIAGNOSIS — B182 Chronic viral hepatitis C: Secondary | ICD-10-CM | POA: Diagnosis not present

## 2021-08-16 DIAGNOSIS — N3289 Other specified disorders of bladder: Secondary | ICD-10-CM | POA: Diagnosis not present

## 2021-08-16 DIAGNOSIS — K219 Gastro-esophageal reflux disease without esophagitis: Secondary | ICD-10-CM | POA: Diagnosis not present

## 2021-08-16 DIAGNOSIS — Z87891 Personal history of nicotine dependence: Secondary | ICD-10-CM | POA: Diagnosis not present

## 2021-08-16 HISTORY — PX: TRANSURETHRAL RESECTION OF BLADDER TUMOR: SHX2575

## 2021-08-16 SURGERY — TURBT (TRANSURETHRAL RESECTION OF BLADDER TUMOR)
Anesthesia: General | Site: Bladder

## 2021-08-16 MED ORDER — FENTANYL CITRATE (PF) 100 MCG/2ML IJ SOLN: 25.0000 ug | INTRAMUSCULAR | Status: DC | PRN

## 2021-08-16 MED ORDER — MIDAZOLAM HCL 2 MG/2ML IJ SOLN
INTRAMUSCULAR | Status: AC
  Filled 2021-08-16: qty 2

## 2021-08-16 MED ORDER — SODIUM CHLORIDE 0.9 % IR SOLN
Status: DC | PRN
  Administered 2021-08-16: 3000 mL

## 2021-08-16 MED ORDER — CEFAZOLIN SODIUM-DEXTROSE 2-4 GM/100ML-% IV SOLN
INTRAVENOUS | Status: AC
  Filled 2021-08-16: qty 100

## 2021-08-16 MED ORDER — APIXABAN 5 MG PO TABS: 5.0000 mg | ORAL_TABLET | Freq: Two times a day (BID) | ORAL | 0 refills | Status: DC

## 2021-08-16 MED ORDER — ACETAMINOPHEN 10 MG/ML IV SOLN: 1000.0000 mg | Freq: Once | INTRAVENOUS | Status: DC | PRN

## 2021-08-16 MED ORDER — ONDANSETRON HCL 4 MG/2ML IJ SOLN
INTRAMUSCULAR | Status: DC | PRN
  Administered 2021-08-16: 4 mg via INTRAVENOUS

## 2021-08-16 MED ORDER — CEFAZOLIN SODIUM-DEXTROSE 2-4 GM/100ML-% IV SOLN
2.0000 g | Freq: Once | INTRAVENOUS | Status: AC
  Administered 2021-08-16: 2 g via INTRAVENOUS

## 2021-08-16 MED ORDER — FENTANYL CITRATE (PF) 100 MCG/2ML IJ SOLN
INTRAMUSCULAR | Status: DC | PRN
  Administered 2021-08-16 (×2): 50 ug via INTRAVENOUS

## 2021-08-16 MED ORDER — MIDAZOLAM HCL 5 MG/5ML IJ SOLN
INTRAMUSCULAR | Status: DC | PRN
  Administered 2021-08-16: 2 mg via INTRAVENOUS

## 2021-08-16 MED ORDER — PROPOFOL 10 MG/ML IV BOLUS
INTRAVENOUS | Status: AC
  Filled 2021-08-16: qty 20

## 2021-08-16 MED ORDER — LIDOCAINE 2% (20 MG/ML) 5 ML SYRINGE
INTRAMUSCULAR | Status: DC | PRN
  Administered 2021-08-16: 60 mg via INTRAVENOUS

## 2021-08-16 MED ORDER — ONDANSETRON HCL 4 MG/2ML IJ SOLN: 4.0000 mg | Freq: Once | INTRAMUSCULAR | Status: DC | PRN

## 2021-08-16 MED ORDER — LACTATED RINGERS IV SOLN: INTRAVENOUS | Status: DC

## 2021-08-16 MED ORDER — PROPOFOL 10 MG/ML IV BOLUS
INTRAVENOUS | Status: DC | PRN
  Administered 2021-08-16: 200 mg via INTRAVENOUS

## 2021-08-16 MED ORDER — FENTANYL CITRATE (PF) 100 MCG/2ML IJ SOLN
INTRAMUSCULAR | Status: AC
  Filled 2021-08-16: qty 2

## 2021-08-16 MED ORDER — AMISULPRIDE (ANTIEMETIC) 5 MG/2ML IV SOLN: 10.0000 mg | Freq: Once | INTRAVENOUS | Status: DC | PRN

## 2021-08-16 MED ORDER — ONDANSETRON HCL 4 MG/2ML IJ SOLN
INTRAMUSCULAR | Status: AC
  Filled 2021-08-16: qty 2

## 2021-08-16 SURGICAL SUPPLY — 29 items
BAG DRAIN URO-CYSTO SKYTR STRL (DRAIN) ×3 IMPLANT
BAG DRN RND TRDRP ANRFLXCHMBR (UROLOGICAL SUPPLIES) ×1
BAG DRN UROCATH (DRAIN) ×1
BAG URINE DRAIN 2000ML AR STRL (UROLOGICAL SUPPLIES) ×3 IMPLANT
BAG URINE LEG 500ML (DRAIN) IMPLANT
CATH FOLEY 2WAY SLVR  5CC 20FR (CATHETERS) ×3
CATH FOLEY 2WAY SLVR  5CC 22FR (CATHETERS)
CATH FOLEY 2WAY SLVR 5CC 20FR (CATHETERS) ×1 IMPLANT
CATH FOLEY 2WAY SLVR 5CC 22FR (CATHETERS) IMPLANT
CATH URET 5FR 28IN OPEN ENDED (CATHETERS) IMPLANT
CLOTH BEACON ORANGE TIMEOUT ST (SAFETY) ×3 IMPLANT
DRSG TELFA 3X8 NADH (GAUZE/BANDAGES/DRESSINGS) IMPLANT
ELECT REM PT RETURN 9FT ADLT (ELECTROSURGICAL)
ELECTRODE REM PT RTRN 9FT ADLT (ELECTROSURGICAL) IMPLANT
EVACUATOR MICROVAS BLADDER (UROLOGICAL SUPPLIES) IMPLANT
GLOVE SURG ENC MOIS LTX SZ7.5 (GLOVE) ×3 IMPLANT
GLOVE SURG ENC MOIS LTX SZ8 (GLOVE) IMPLANT
GOWN STRL REUS W/TWL LRG LVL3 (GOWN DISPOSABLE) ×3 IMPLANT
HOLDER FOLEY CATH W/STRAP (MISCELLANEOUS) ×3 IMPLANT
IV NS IRRIG 3000ML ARTHROMATIC (IV SOLUTION) ×3 IMPLANT
KIT TURNOVER CYSTO (KITS) ×3 IMPLANT
LOOP CUT BIPOLAR 24F LRG (ELECTROSURGICAL) ×3 IMPLANT
MANIFOLD NEPTUNE II (INSTRUMENTS) ×3 IMPLANT
PACK CYSTO (CUSTOM PROCEDURE TRAY) ×3 IMPLANT
PLUG CATH AND CAP STER (CATHETERS) IMPLANT
TUBE CONNECTING 12'X1/4 (SUCTIONS) ×1
TUBE CONNECTING 12X1/4 (SUCTIONS) ×2 IMPLANT
TUBING UROLOGY SET (TUBING) ×3 IMPLANT
WATER STERILE IRR 500ML POUR (IV SOLUTION) ×3 IMPLANT

## 2021-08-16 NOTE — Discharge Instructions (Addendum)
Removal of the Foley-remove the Foley on Thursday morning, August 18, 2021 as instructed. CYSTOSCOPY HOME CARE INSTRUCTIONS     Post Anesthesia Home Care Instructions  Activity: Get plenty of rest for the remainder of the day. A responsible individual must stay with you for 24 hours following the procedure.  For the next 24 hours, DO NOT: -Drive a car -Paediatric nurse -Drink alcoholic beverages -Take any medication unless instructed by your physician -Make any legal decisions or sign important papers.  Meals: Start with liquid foods such as gelatin or soup. Progress to regular foods as tolerated. Avoid greasy, spicy, heavy foods. If nausea and/or vomiting occur, drink only clear liquids until the nausea and/or vomiting subsides. Call your physician if vomiting continues.  Special Instructions/Symptoms: Your throat may feel dry or sore from the anesthesia or the breathing tube placed in your throat during surgery. If this causes discomfort, gargle with warm salt water. The discomfort should disappear within 24 hours.  If you had a scopolamine patch placed behind your ear for the management of post- operative nausea and/or vomiting:  1. The medication in the patch is effective for 72 hours, after which it should be removed.  Wrap patch in a tissue and discard in the trash. Wash hands thoroughly with soap and water. 2. You may remove the patch earlier than 72 hours if you experience unpleasant side effects which may include dry mouth, dizziness or visual disturbances. 3. Avoid touching the patch. Wash your hands with soap and water after contact with the patch.

## 2021-08-16 NOTE — Anesthesia Preprocedure Evaluation (Addendum)
Anesthesia Evaluation  Patient identified by MRN, date of birth, ID band Patient awake    Reviewed: Allergy & Precautions, NPO status , Patient's Chart, lab work & pertinent test results  Airway Mallampati: II  TM Distance: >3 FB Neck ROM: Full    Dental  (+) Edentulous Upper, Missing   Pulmonary COPD, former smoker,    Pulmonary exam normal breath sounds clear to auscultation       Cardiovascular Normal cardiovascular exam+ dysrhythmias Atrial Fibrillation + Valvular Problems/Murmurs MR  Rhythm:Regular Rate:Normal  ECG: a-fib, rate 86  ECHO: 1. Left ventricular ejection fraction, by estimation, is 55 to 60%. The left ventricle has normal function. The left ventricle has no regional wall motion abnormalities. Left ventricular diastolic parameters are indeterminate. 2. Right ventricular systolic function is low normal. The right ventricular size is mildly enlarged. There is normal pulmonary artery systolic pressure. The estimated right ventricular systolic pressure is 50.5 mmHg. 3. Left atrial size was moderately dilated. 4. Right atrial size was moderately dilated. 5. There is a trivial pericardial effusion posterior to the left ventricle. 6. The mitral valve is grossly normal. Mild to moderate mitral valve regurgitation. 7. Tricuspid valve regurgitation is mild to moderate. 8. The aortic valve is tricuspid. There is mild calcification of the aortic valve. Aortic valve regurgitation is not visualized. 9. The inferior vena cava is normal in size with greater than 50% respiratory variability, suggesting right atrial pressure of 3 mmHg.   Neuro/Psych negative neurological ROS  negative psych ROS   GI/Hepatic GERD  Medicated and Controlled,(+) Hepatitis - (treated), C  Endo/Other  negative endocrine ROS  Renal/GU negative Renal ROS     Musculoskeletal  (+) Arthritis ,   Abdominal   Peds  Hematology negative  hematology ROS (+)   Anesthesia Other Findings BLADDER CANCER  Reproductive/Obstetrics                            Anesthesia Physical Anesthesia Plan  ASA: 3  Anesthesia Plan: General   Post-op Pain Management:    Induction: Intravenous  PONV Risk Score and Plan: 2 and Ondansetron, Dexamethasone and Treatment may vary due to age or medical condition  Airway Management Planned: LMA  Additional Equipment:   Intra-op Plan:   Post-operative Plan: Extubation in OR  Informed Consent: I have reviewed the patients History and Physical, chart, labs and discussed the procedure including the risks, benefits and alternatives for the proposed anesthesia with the patient or authorized representative who has indicated his/her understanding and acceptance.     Dental advisory given  Plan Discussed with: CRNA  Anesthesia Plan Comments:         Anesthesia Quick Evaluation
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
## 2021-08-16 NOTE — Anesthesia Postprocedure Evaluation (Signed)
Anesthesia Post Note  Patient: Kenneth [name]  Procedure(s) Performed: TRANSURETHRAL RESECTION OF BLADDER TUMOR (TURBT) (Bladder)     Patient location during evaluation: PACU Anesthesia Type: General Level of consciousness: awake Pain management: pain level controlled Vital Signs Assessment: post-procedure vital signs reviewed and stable Respiratory status: spontaneous breathing, nonlabored ventilation, respiratory function stable and patient connected to nasal cannula oxygen Cardiovascular status: blood pressure returned to baseline and stable Postop Assessment: no apparent nausea or vomiting Anesthetic complications: no   No notable events documented.  Last Vitals:  Vitals:   08/16/21 1030 08/16/21 1110  BP: (!) 143/101 (!) 149/90  Pulse: 83 88  Resp: 13 18  Temp:  (!) 36.4 C  SpO2: 95% 100%    Last Pain:  Vitals:   08/16/21 1110  TempSrc:   PainSc: 0-No pain                 Duvall Comes P Dawnell Bryant

## 2021-08-16 NOTE — Transfer of Care (Signed)
Immediate Anesthesia Transfer of Care Note  Patient: Kenneth [name]  Procedure(s) Performed: TRANSURETHRAL RESECTION OF BLADDER TUMOR (TURBT) (Bladder)  Patient Location: PACU  Anesthesia Type:General  Level of Consciousness: sedated  Airway & Oxygen Therapy: Patient Spontanous Breathing and Patient connected to nasal cannula oxygen  Post-op Assessment: Report given to RN and Post -op Vital signs reviewed and stable  Post vital signs: Reviewed and stable  Last Vitals:  Vitals Value Taken Time  BP 124/80 08/16/21 0945  Temp    Pulse 104 08/16/21 0946  Resp 15 08/16/21 0946  SpO2 96 % 08/16/21 0946  Vitals shown include unvalidated device data.  Last Pain:  Vitals:   08/16/21 0720  TempSrc: Oral  PainSc: 0-No pain      Patients Stated Pain Goal: 3 (82/50/53 9767)  Complications: No notable events documented.

## 2021-08-16 NOTE — Op Note (Signed)
Preoperative diagnosis: Bladder cancer Postoperative diagnosis: Same  Procedure: TURBT 2 to 5 cm  Surgeon: Junious Silk  Anesthesia: General  Indication for procedure: [name] is a 70 year old male with a history of bladder mass and TURBT.  Path was high-grade infiltrating urothelial carcinoma into the detrusor muscle.  He was brought for repeat and maximal TUR prior to beginning radiation and chemo to his bladder.  Findings: On exam under anesthesia penis uncircumcised, foreskin, glans meatus appeared normal.  Penis and scrotum appeared normal.  Testicles descended bilaterally and palpably normal.  On digital rectal exam the prostate was smooth and no hard area or nodule.  Prostate about 40 g.  No palpable bladder mass on bimanual.  On cystoscopy the urethra was unremarkable, borderline obstructing prostate.  The bladder was unremarkable other than prior resection site and some granulation tissue at the right anterior bladder neck.  This entire area was read resected.  There was no concern for perforation.  No visible or residual tumor noted.  Description of procedure: After consent was obtained patient brought to the operating room.  After adequate anesthesia he was placed lithotomy position and prepped and draped in the usual sterile fashion.  Timeout was performed to confirm the patient and procedure.  Cystoscope was passed per urethra the bladder carefully inspected with 30 degree and 70 degree lens.  I then swapped out out for the continuous-flow sheath with the visual obturator and swap that out for the handle and loop.  The entire prior resection site was really resected.  No visible tumor.  Fulguration was performed and hemostasis was excellent under low pressure.  The chips were collected and sent to pathology as bladder tumor although this was the prior bladder tumor site.  No visible tumor today.  The scope was backed out and 20 [place] two-way catheter placed in left to gravity  drainage.  Urine was clear.  Complications: None  Blood loss: Minimal  Specimens to pathology: Bladder tumor (prior resection site)  Drains: 20 French Foley catheter  Disposition: Patient stable to PACU

## 2021-08-17 ENCOUNTER — Encounter (HOSPITAL_BASED_OUTPATIENT_CLINIC_OR_DEPARTMENT_OTHER): Payer: Self-pay | Admitting: Urology

## 2021-08-17 ENCOUNTER — Inpatient Hospital Stay: Payer: Medicare Other

## 2021-08-17 ENCOUNTER — Inpatient Hospital Stay (HOSPITAL_BASED_OUTPATIENT_CLINIC_OR_DEPARTMENT_OTHER): Payer: Medicare Other | Admitting: Oncology

## 2021-08-17 ENCOUNTER — Telehealth: Payer: Self-pay | Admitting: Internal Medicine

## 2021-08-17 ENCOUNTER — Encounter: Payer: Self-pay | Admitting: Oncology

## 2021-08-17 VITALS — BP 171/104 | HR 108 | Temp 97.5°F | Resp 18 | Wt 195.6 lb

## 2021-08-17 DIAGNOSIS — C679 Malignant neoplasm of bladder, unspecified: Secondary | ICD-10-CM

## 2021-08-17 DIAGNOSIS — Z510 Encounter for antineoplastic radiation therapy: Secondary | ICD-10-CM | POA: Diagnosis not present

## 2021-08-17 DIAGNOSIS — Z79899 Other long term (current) drug therapy: Secondary | ICD-10-CM | POA: Insufficient documentation

## 2021-08-17 DIAGNOSIS — Z5111 Encounter for antineoplastic chemotherapy: Secondary | ICD-10-CM | POA: Insufficient documentation

## 2021-08-17 DIAGNOSIS — C678 Malignant neoplasm of overlapping sites of bladder: Secondary | ICD-10-CM | POA: Insufficient documentation

## 2021-08-17 DIAGNOSIS — C670 Malignant neoplasm of trigone of bladder: Secondary | ICD-10-CM | POA: Diagnosis not present

## 2021-08-17 LAB — CMP (CANCER CENTER ONLY)
ALT: 11 U/L (ref 0–44)
AST: 16 U/L (ref 15–41)
Albumin: 4 g/dL (ref 3.5–5.0)
Alkaline Phosphatase: 63 U/L (ref 38–126)
Anion gap: 8 (ref 5–15)
BUN: 11 mg/dL (ref 8–23)
CO2: 29 mmol/L (ref 22–32)
Calcium: 8.9 mg/dL (ref 8.9–10.3)
Chloride: 105 mmol/L (ref 98–111)
Creatinine: 0.78 mg/dL (ref 0.61–1.24)
GFR, Estimated: >60 mL/min (ref >60)
Glucose, Bld: 80 mg/dL (ref 70–99)
Potassium: 3.7 mmol/L (ref 3.5–5.1)
Sodium: 142 mmol/L (ref 135–145)
Total Bilirubin: 1.2 mg/dL (ref 0.3–1.2)
Total Protein: 7.3 g/dL (ref 6.5–8.1)

## 2021-08-17 LAB — CBC WITH DIFFERENTIAL (CANCER CENTER ONLY)
Abs Immature Granulocytes: 0.02 10*3/uL (ref 0.00–0.07)
Basophils Absolute: 0 10*3/uL (ref 0.0–0.1)
Basophils Relative: 0 %
Eosinophils Absolute: 0.1 10*3/uL (ref 0.0–0.5)
Eosinophils Relative: 1 %
HCT: 47.3 % (ref 39.0–52.0)
Hemoglobin: 15.6 g/dL (ref 13.0–17.0)
Immature Granulocytes: 0 %
Lymphocytes Relative: 23 %
Lymphs Abs: 1.6 10*3/uL (ref 0.7–4.0)
MCH: 31 pg (ref 26.0–34.0)
MCHC: 33 g/dL (ref 30.0–36.0)
MCV: 93.8 fL (ref 80.0–100.0)
Monocytes Absolute: 0.7 10*3/uL (ref 0.1–1.0)
Monocytes Relative: 9 %
Neutro Abs: 4.8 10*3/uL (ref 1.7–7.7)
Neutrophils Relative %: 67 %
Platelet Count: 144 10*3/uL — ABNORMAL LOW (ref 150–400)
RBC: 5.04 MIL/uL (ref 4.22–5.81)
RDW: 14.6 % (ref 11.5–15.5)
Smear Review: NORMAL
WBC Count: 7.2 10*3/uL (ref 4.0–10.5)
nRBC: 0 % (ref 0.0–0.2)

## 2021-08-17 MED ORDER — PROCHLORPERAZINE MALEATE 10 MG PO TABS: 10.0000 mg | ORAL_TABLET | Freq: Four times a day (QID) | ORAL | 0 refills | Status: DC | PRN

## 2021-08-17 NOTE — Telephone Encounter (Signed)
   Kenneth [name] DOB: Dec 19, 1950 MRN: [id number]   RIDER WAIVER AND RELEASE OF LIABILITY  For purposes of improving physical access to our facilities, Kenneth [name] is pleased to partner with third parties to provide Kenneth [name] patients or other authorized individuals the option of convenient, on-demand ground transportation services (the Kenneth [name]") through use of the technology service that enables users to request on-demand ground transportation from independent third-party providers.  By opting to use and accept these [employer], I, the undersigned, hereby agree on behalf of myself, and on behalf of any minor child using the Kenneth [name] for whom I am the parent or legal guardian, as follows:  Kenneth [name] provided to me are provided by independent third-party transportation providers who are not [employer] or employees and who are unaffiliated with [employer]. Kenneth [name] is neither a transportation carrier nor a common or public carrier. Kenneth [name] has no control over the quality or safety of the transportation that occurs as a result of the [employer]. Kenneth [name] cannot guarantee that any third-party transportation provider will complete any arranged transportation service. Kenneth [name] makes no representation, warranty, or guarantee regarding the reliability, timeliness, quality, safety, suitability, or availability of any of the Transport Services or that they will be error free. I fully understand that traveling by vehicle involves risks and dangers of serious bodily injury, including permanent disability, paralysis, and death. I agree, on behalf of myself and on behalf of any minor child using the Transport Services for whom I am the parent or legal guardian, that the entire risk arising out of my use of the [employer] remains solely with me, to the maximum extent permitted under applicable law. The [employer] are provided "as  is" and "as available." Kenneth [name] disclaims all representations and warranties, express, implied or statutory, not expressly set out in these terms, including the implied warranties of merchantability and fitness for a particular purpose. I hereby waive and release Kenneth [name], its agents, employees, officers, directors, representatives, insurers, attorneys, assigns, successors, subsidiaries, and affiliates from any and all past, present, or future claims, demands, liabilities, actions, causes of action, or suits of any kind directly or indirectly arising from acceptance and use of the [employer]. I further waive and release Kenneth [name] and its affiliates from all present and future liability and responsibility for any injury or death to persons or damages to property caused by or related to the use of the [employer]. I have read this Waiver and Release of Liability, and I understand the terms used in it and their legal significance. This Waiver is freely and voluntarily given with the understanding that my right (as well as the right of any minor child for whom I am the parent or legal guardian using the [employer]) to legal recourse against [place] in connection with the [employer] is knowingly surrendered in return for use of these services.   I attest that I read the consent document to Kenneth [name], gave Kenneth [name] the opportunity to ask questions and answered the questions asked (if any). I affirm that Kenneth [name] then provided consent for he's participation in this program.     Kenneth [name]

## 2021-08-17 NOTE — Progress Notes (Signed)
Met with patient at registration to introduce myself as Arboriculturist and to offer available resources.  Discussed one-time $1000 Radio broadcast assistant to assist with personal expenses while going through treatment.  Gave him my card if interested in applying and for any additional financial questions or concerns.

## 2021-08-17 NOTE — Progress Notes (Signed)
Hematology and Oncology Follow Up Visit  Kenneth [name] [id number] May 25, 1951 70 y.o. 08/17/2021 9:06 AM Glenda Chroman, MDVyas, Dhruv B, MD   Principle Diagnosis: 70 year old with high-grade urothelial carcinoma of the bladder diagnosed in September 2022.  He was found to have T2N0 without any evidence of metastatic disease.   Prior Therapy: He is status post TURBT on June 17, 2021. 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    Current therapy: Under consideration to start definitive therapy with radiation and weekly chemotherapy.  Interim History: Kenneth [name] returns today for a follow-up visit.  Since her last visit, he had completed repeat TURBT under the care of Dr. Junious Silk without any complications.  He denies any hematuria, dysuria or abdominal pain.  Denies any fevers or chills.     Medications: I have reviewed the patient's current medications.  Current Outpatient Medications  Medication Sig Dispense Refill   acetaminophen (TYLENOL) 500 MG tablet Take 500-1,000 mg by mouth every 6 (six) hours as needed for moderate pain.     alendronate (FOSAMAX) 70 MG tablet Take 70 mg by mouth once a week. Takes on Thursday's ;  Take with a full glass of water on an empty stomach.     [START ON 08/19/2021] apixaban (ELIQUIS) 5 MG TABS tablet Take 1 tablet (5 mg total) by mouth 2 (two) times daily. 60 tablet 0   Ascorbic Acid (VITAMIN C) 1000 MG tablet Take 1,000 mg by mouth 2 (two) times daily.     calcium-vitamin D (OSCAL WITH D) 500-200 MG-UNIT tablet Take 1 tablet by mouth 2 (two) times daily.     Famotidine (PEPCID PO) Take 1 tablet by mouth as needed.     magic mouthwash SOLN Take 5 mLs by mouth 4 (four) times daily as needed for mouth pain.     Magnesium 250 MG TABS Take 250 mg by mouth daily.     Multiple Vitamins-Minerals  (MULTIVITAMIN PO) Take 1 tablet by mouth daily.     omeprazole (PRILOSEC) 20 MG capsule Take 20 mg by mouth daily.     Polyethyl Glycol-Propyl Glycol (SYSTANE OP) Place 1 drop into both eyes daily as needed (dry eyes).     prochlorperazine (COMPAZINE) 10 MG tablet Take 1 tablet (10 mg total) by mouth every 6 (six) hours as needed for nausea or vomiting. (Patient not taking: Reported on 08/10/2021) 30 tablet 0   pseudoephedrine (SUDAFED) 30 MG tablet Take 30 mg by mouth daily as needed for congestion. (Patient not taking: Reported on 06/07/2021)     sildenafil (REVATIO) 20 MG tablet Take 20-60 mg by mouth daily as needed (erectile dysfunction). (Patient not taking: Reported on 06/15/2021)     tiZANidine (ZANAFLEX) 4 MG capsule Take 4 mg by mouth 2 (two) times daily as needed for muscle spasms.     No current facility-administered medications for this visit.     Allergies: No Known Allergies    Physical Exam: Blood pressure (!) 171/104, pulse (!) 108, temperature (!) 97.5 F (36.4 C), resp. rate 18, weight 195 lb 9.6 oz (88.7 kg), SpO2 97 %.  ECOG: 0   General appearance: Alert, awake without any distress. Head: Atraumatic without abnormalities Oropharynx: Without any thrush or ulcers. Eyes: No scleral icterus. Lymph nodes: No lymphadenopathy noted in the cervical, supraclavicular, or axillary nodes Heart:regular rate and rhythm, without any  murmurs or gallops.   Lung: Clear to auscultation without any rhonchi, wheezes or dullness to percussion. Abdomin: Soft, nontender without any shifting dullness or ascites. Musculoskeletal: No clubbing or cyanosis. Neurological: No motor or sensory deficits. Skin: No rashes or lesions.     Lab Results: Lab Results  Component Value Date   WBC 4.3 01/12/2021   HGB 15.9 01/12/2021   HCT 47.6 01/12/2021   MCV 92.6 01/12/2021   PLT 119 (L) 01/12/2021     Chemistry      Component Value Date/Time   NA 143 07/19/2021 1019   K 4.4 07/19/2021  1019   CL 105 07/19/2021 1019   CO2 28 07/19/2021 1019   BUN 14 07/19/2021 1019   CREATININE 0.78 07/19/2021 1019      Component Value Date/Time   CALCIUM 9.3 07/19/2021 1019   ALKPHOS 49 06/17/2021 0637   AST 19 07/19/2021 1019   ALT 19 07/19/2021 1019   BILITOT 1.1 07/19/2021 1019         Impression and Plan:  70 year old with:  1.  T2N0 high-grade urothelial carcinoma of the bladder diagnosed in September 2022.    His disease status was updated and treatment choices were reiterated.  He underwent repeat TURBT on 08/16/2021.  He also was evaluated by Dr. Tammi Klippel and tentatively scheduled to start radiation in the next 3 weeks.  Risks and benefits of adding cisplatin to radiation treatment were discussed again today.  Complications include nausea, vomiting, myelosuppression, neutropenia and possible sepsis were reiterated.  He is agreeable to proceed at this time.     2.  IV access: Peripheral veins will be in use at this time.   3.  Antiemetics: Risk of nausea is assessed at this time and will have Compazine available to him.   4.  Renal function surveillance: His kidney function will be repeated today and followed closely while on therapy.   5.  Goals of care: Treatment remains curative at this time with aggressive measures.   6.  Follow-up: In the next few weeks for the start of therapy.   30  minutes were spent on this encounter.  The time was dedicated to reviewing disease status, treatment choices and future plan of care discussion.     Zola Button, MD 11/9/20229:06 AM

## 2021-08-18 LAB — SURGICAL PATHOLOGY

## 2021-08-22 DIAGNOSIS — H40053 Ocular hypertension, bilateral: Secondary | ICD-10-CM | POA: Diagnosis not present

## 2021-08-22 DIAGNOSIS — H40013 Open angle with borderline findings, low risk, bilateral: Secondary | ICD-10-CM | POA: Diagnosis not present

## 2021-08-24 ENCOUNTER — Telehealth: Payer: Self-pay | Admitting: Oncology

## 2021-08-24 NOTE — Telephone Encounter (Signed)
Scheduled per 11/09 los, patient has been called and notified.

## 2021-08-25 ENCOUNTER — Ambulatory Visit
Admission: RE | Admit: 2021-08-25 | Discharge: 2021-08-25 | Disposition: A | Discharge status: Home / Self Care | Payer: Medicare Other | Source: Ambulatory Visit | Attending: Radiation Oncology | Admitting: Radiation Oncology

## 2021-08-25 ENCOUNTER — Encounter: Payer: Self-pay | Admitting: Oncology

## 2021-08-25 ENCOUNTER — Other Ambulatory Visit: Payer: Self-pay

## 2021-08-25 DIAGNOSIS — Z510 Encounter for antineoplastic radiation therapy: Secondary | ICD-10-CM | POA: Insufficient documentation

## 2021-08-25 DIAGNOSIS — C670 Malignant neoplasm of trigone of bladder: Secondary | ICD-10-CM | POA: Diagnosis not present

## 2021-08-25 DIAGNOSIS — Z5111 Encounter for antineoplastic chemotherapy: Secondary | ICD-10-CM | POA: Diagnosis not present

## 2021-08-25 DIAGNOSIS — Z79899 Other long term (current) drug therapy: Secondary | ICD-10-CM | POA: Diagnosis not present

## 2021-08-25 DIAGNOSIS — C678 Malignant neoplasm of overlapping sites of bladder: Secondary | ICD-10-CM | POA: Insufficient documentation

## 2021-08-25 NOTE — Progress Notes (Signed)
Received voicemail from patient regarding [employer].  Returned call to patient and answered his specific questions regarding grant. Advised what is needed to apply. He will bring at next appointment.  He has my card for any additional financial questions or concerns.

## 2021-08-26 ENCOUNTER — Telehealth: Payer: Self-pay

## 2021-08-26 ENCOUNTER — Other Ambulatory Visit: Payer: Medicare Other

## 2021-08-26 NOTE — Telephone Encounter (Signed)
Spoke with Mr [name] yesterday in Radiation simulation and reviewed handout of CDDP pre hydration at home prior to treatment. Pt has Education packet at home. Gave updated appointment schedule. He has transportation via [facility] in place. Pt has 2 prescriptions for compazine at home. Questions answered.

## 2021-08-26 NOTE — Progress Notes (Signed)
  Radiation Oncology         [phone] ________________________________  Name: Kenneth [name] MRN: [id number]  Date: 08/25/2021  DOB: 01-Jun-1951  SIMULATION AND TREATMENT PLANNING NOTE    ICD-10-CM   1. Primary cancer of trigone of urinary bladder (HCC)  C67.0       DIAGNOSIS:  70 yo gentleman with high grade, T2N0, muscle invasive urothelial carcinoma of the bladder.  NARRATIVE:  The patient was brought to the [facility].  Identity was confirmed.  All relevant records and images related to the planned course of therapy were reviewed.  The patient freely provided informed written consent to proceed with treatment after reviewing the details related to the planned course of therapy. The consent form was witnessed and verified by the simulation staff.  Then, the patient was set-up in a stable reproducible  supine position for radiation therapy.  Contrast was instilled into the bladder with a catheter under sterile conditions.  CT images were obtained.  Surface markings were placed.  The CT images were loaded into the planning software.  Then the target and avoidance structures were contoured.  Treatment planning then occurred.  The radiation prescription was entered and confirmed.  Then, I designed and supervised the construction of a total of 5 medically necessary complex treatment devices including VacLoc body positioner and 4 MLCs to shield the bowel and femoral necks.  I have requested : 3D Simulation  I have requested a DVH of the following structures: small bowel, rectum, left femoral head, right femoral head and targets.  SPECIAL TREATMENT PROCEDURE:  The planned course of therapy using radiation constitutes a special treatment procedure. Special care is required in the management of this patient for the following reasons. This treatment constitutes a Special Treatment Procedure for the following reason: [ Concurrent chemotherapy requiring careful monitoring for increased  toxicities of treatment including weekly laboratory values..  The special nature of the planned course of radiotherapy will require increased physician supervision and oversight to ensure patient's safety with optimal treatment outcomes.  PLAN:  The patient will receive 19.8 Gy in 11 fractions of 1.8 Gy to the bladder tumor with full bladder, then 45 Gy in 25 fractions to the whole bladder and pelvic nodes to a total dose of 64.8 Gy.  ________________________________  Sheral Apley Tammi Klippel, M.D.

## 2021-08-30 DIAGNOSIS — C678 Malignant neoplasm of overlapping sites of bladder: Secondary | ICD-10-CM | POA: Diagnosis not present

## 2021-08-30 DIAGNOSIS — C670 Malignant neoplasm of trigone of bladder: Secondary | ICD-10-CM | POA: Diagnosis not present

## 2021-08-30 DIAGNOSIS — Z5111 Encounter for antineoplastic chemotherapy: Secondary | ICD-10-CM | POA: Diagnosis not present

## 2021-08-30 DIAGNOSIS — Z510 Encounter for antineoplastic radiation therapy: Secondary | ICD-10-CM | POA: Diagnosis not present

## 2021-08-30 DIAGNOSIS — Z79899 Other long term (current) drug therapy: Secondary | ICD-10-CM | POA: Diagnosis not present

## 2021-09-06 ENCOUNTER — Inpatient Hospital Stay: Payer: Medicare Other

## 2021-09-06 ENCOUNTER — Other Ambulatory Visit: Payer: Self-pay

## 2021-09-06 ENCOUNTER — Encounter: Payer: Self-pay | Admitting: Oncology

## 2021-09-06 ENCOUNTER — Ambulatory Visit
Admission: RE | Admit: 2021-09-06 | Discharge: 2021-09-06 | Disposition: A | Discharge status: Home / Self Care | Payer: Medicare Other | Source: Ambulatory Visit | Attending: Radiation Oncology | Admitting: Radiation Oncology

## 2021-09-06 DIAGNOSIS — C678 Malignant neoplasm of overlapping sites of bladder: Secondary | ICD-10-CM | POA: Diagnosis not present

## 2021-09-06 DIAGNOSIS — Z510 Encounter for antineoplastic radiation therapy: Secondary | ICD-10-CM | POA: Diagnosis not present

## 2021-09-06 DIAGNOSIS — C670 Malignant neoplasm of trigone of bladder: Secondary | ICD-10-CM | POA: Diagnosis not present

## 2021-09-06 DIAGNOSIS — Z79899 Other long term (current) drug therapy: Secondary | ICD-10-CM | POA: Diagnosis not present

## 2021-09-06 DIAGNOSIS — Z5111 Encounter for antineoplastic chemotherapy: Secondary | ICD-10-CM | POA: Diagnosis not present

## 2021-09-06 NOTE — Progress Notes (Signed)
Met with patient at registration to complete grant process.  Patient approved for one-time $1000 [employer] grant to assist with personal expenses while going through treatment. Discussed expenses in detail and how they are covered. He has a copy of the approval letter and expense sheet along with the Outpatient pharmacy information. He received a gift card today from the grant.  He has paperwork and my card in a green folder and for any additional financial questions or concerns.  Also, gave him a Srir[employer] application whom assists patients whom live in [place] only Advised he may bring it and supporting application to me to return via email and they will contact him directly with determination. He verbalized understanding.

## 2021-09-07 ENCOUNTER — Inpatient Hospital Stay: Payer: Medicare Other

## 2021-09-07 ENCOUNTER — Ambulatory Visit
Admission: RE | Admit: 2021-09-07 | Discharge: 2021-09-07 | Disposition: A | Discharge status: Home / Self Care | Payer: Medicare Other | Source: Ambulatory Visit | Attending: Radiation Oncology | Admitting: Radiation Oncology

## 2021-09-07 VITALS — BP 133/85 | HR 83 | Temp 98.3°F | Resp 18 | Wt 192.5 lb

## 2021-09-07 DIAGNOSIS — Z510 Encounter for antineoplastic radiation therapy: Secondary | ICD-10-CM | POA: Diagnosis not present

## 2021-09-07 DIAGNOSIS — C670 Malignant neoplasm of trigone of bladder: Secondary | ICD-10-CM | POA: Diagnosis not present

## 2021-09-07 DIAGNOSIS — Z5111 Encounter for antineoplastic chemotherapy: Secondary | ICD-10-CM | POA: Diagnosis not present

## 2021-09-07 DIAGNOSIS — C678 Malignant neoplasm of overlapping sites of bladder: Secondary | ICD-10-CM | POA: Diagnosis not present

## 2021-09-07 DIAGNOSIS — C679 Malignant neoplasm of bladder, unspecified: Secondary | ICD-10-CM

## 2021-09-07 DIAGNOSIS — Z79899 Other long term (current) drug therapy: Secondary | ICD-10-CM | POA: Diagnosis not present

## 2021-09-07 LAB — CBC WITH DIFFERENTIAL (CANCER CENTER ONLY)
Abs Immature Granulocytes: 0 10*3/uL (ref 0.00–0.07)
Basophils Absolute: 0 10*3/uL (ref 0.0–0.1)
Basophils Relative: 0 %
Eosinophils Absolute: 0.2 10*3/uL (ref 0.0–0.5)
Eosinophils Relative: 4 %
HCT: 45.2 % (ref 39.0–52.0)
Hemoglobin: 15.1 g/dL (ref 13.0–17.0)
Immature Granulocytes: 0 %
Lymphocytes Relative: 26 %
Lymphs Abs: 1.2 10*3/uL (ref 0.7–4.0)
MCH: 30.4 pg (ref 26.0–34.0)
MCHC: 33.4 g/dL (ref 30.0–36.0)
MCV: 91.1 fL (ref 80.0–100.0)
Monocytes Absolute: 0.4 10*3/uL (ref 0.1–1.0)
Monocytes Relative: 9 %
Neutro Abs: 2.7 10*3/uL (ref 1.7–7.7)
Neutrophils Relative %: 61 %
Platelet Count: 108 10*3/uL — ABNORMAL LOW (ref 150–400)
RBC: 4.96 MIL/uL (ref 4.22–5.81)
RDW: 14.6 % (ref 11.5–15.5)
WBC Count: 4.5 10*3/uL (ref 4.0–10.5)
nRBC: 0 % (ref 0.0–0.2)

## 2021-09-07 LAB — CMP (CANCER CENTER ONLY)
ALT: 14 U/L (ref 0–44)
AST: 22 U/L (ref 15–41)
Albumin: 4.1 g/dL (ref 3.5–5.0)
Alkaline Phosphatase: 53 U/L (ref 38–126)
Anion gap: 6 (ref 5–15)
BUN: 10 mg/dL (ref 8–23)
CO2: 27 mmol/L (ref 22–32)
Calcium: 8.8 mg/dL — ABNORMAL LOW (ref 8.9–10.3)
Chloride: 103 mmol/L (ref 98–111)
Creatinine: 0.78 mg/dL (ref 0.61–1.24)
GFR, Estimated: >60 mL/min (ref >60)
Glucose, Bld: 103 mg/dL — ABNORMAL HIGH (ref 70–99)
Potassium: 3.8 mmol/L (ref 3.5–5.1)
Sodium: 136 mmol/L (ref 135–145)
Total Bilirubin: 1.5 mg/dL — ABNORMAL HIGH (ref 0.3–1.2)
Total Protein: 7.4 g/dL (ref 6.5–8.1)

## 2021-09-07 MED ORDER — SODIUM CHLORIDE 0.9 % IV SOLN
150.0000 mg | Freq: Once | INTRAVENOUS | Status: AC
  Administered 2021-09-07: 150 mg via INTRAVENOUS
  Filled 2021-09-07: qty 150

## 2021-09-07 MED ORDER — MAGNESIUM SULFATE 2 GM/50ML IV SOLN
2.0000 g | Freq: Once | INTRAVENOUS | Status: AC
  Administered 2021-09-07: 2 g via INTRAVENOUS
  Filled 2021-09-07: qty 50

## 2021-09-07 MED ORDER — SODIUM CHLORIDE 0.9 % IV SOLN
35.0000 mg/m2 | Freq: Once | INTRAVENOUS | Status: AC
  Administered 2021-09-07: 74 mg via INTRAVENOUS
  Filled 2021-09-07: qty 74

## 2021-09-07 MED ORDER — PALONOSETRON HCL INJECTION 0.25 MG/5ML
0.2500 mg | Freq: Once | INTRAVENOUS | Status: AC
  Administered 2021-09-07: 0.25 mg via INTRAVENOUS
  Filled 2021-09-07: qty 5

## 2021-09-07 MED ORDER — SODIUM CHLORIDE 0.9 % IV SOLN
10.0000 mg | Freq: Once | INTRAVENOUS | Status: AC
  Administered 2021-09-07: 10 mg via INTRAVENOUS
  Filled 2021-09-07: qty 10

## 2021-09-07 MED ORDER — SODIUM CHLORIDE 0.9 % IV SOLN: Freq: Once | INTRAVENOUS | Status: AC

## 2021-09-07 MED ORDER — POTASSIUM CHLORIDE IN NACL 20-0.9 MEQ/L-% IV SOLN
Freq: Once | INTRAVENOUS | Status: AC
  Filled 2021-09-07: qty 1000

## 2021-09-07 NOTE — Patient Instructions (Signed)
[facility]  Discharge Instructions: °Thank you for choosing [facility] to provide your oncology and hematology care.  ° °If you have a lab appointment with the Cancer Center, please go directly to the Cancer Center and check in at the registration area. °  °Wear comfortable clothing and clothing appropriate for easy access to any Portacath or PICC line.  ° °We strive to give you quality time with your provider. You may need to reschedule your appointment if you arrive late (15 or more minutes).  Arriving late affects you and other patients whose appointments are after yours.  Also, if you miss three or more appointments without notifying the office, you may be dismissed from the clinic at the provider’s discretion.    °  °For prescription refill requests, have your pharmacy contact our office and allow 72 hours for refills to be completed.   ° °Today you received the following chemotherapy and/or immunotherapy agents Cisplatin    °  °To help prevent nausea and vomiting after your treatment, we encourage you to take your nausea medication as directed. ° °BELOW ARE SYMPTOMS THAT SHOULD BE REPORTED IMMEDIATELY: °*FEVER GREATER THAN 100.4 F (38 °C) OR HIGHER °*CHILLS OR SWEATING °*NAUSEA AND VOMITING THAT IS NOT CONTROLLED WITH YOUR NAUSEA MEDICATION °*UNUSUAL SHORTNESS OF BREATH °*UNUSUAL BRUISING OR BLEEDING °*URINARY PROBLEMS (pain or burning when urinating, or frequent urination) °*BOWEL PROBLEMS (unusual diarrhea, constipation, pain near the anus) °TENDERNESS IN MOUTH AND THROAT WITH OR WITHOUT PRESENCE OF ULCERS (sore throat, sores in mouth, or a toothache) °UNUSUAL RASH, SWELLING OR PAIN  °UNUSUAL VAGINAL DISCHARGE OR ITCHING  ° °Items with * indicate a potential emergency and should be followed up as soon as possible or go to the Emergency Department if any problems should occur. ° °Please show the CHEMOTHERAPY ALERT CARD or IMMUNOTHERAPY ALERT CARD at check-in to  the Emergency Department and triage nurse. ° °Should you have questions after your visit or need to cancel or reschedule your appointment, please contact [facility]  Dept: [phone]  and follow the prompts.  Office hours are 8:00 a.m. to 4:30 p.m. Monday - Friday. Please note that voicemails left after 4:00 p.m. may not be returned until the following business day.  We are closed weekends and major holidays. You have access to a nurse at all times for urgent questions. Please call the main number to the clinic Dept: [phone] and follow the prompts. ° ° °For any non-urgent questions, you may also contact your provider using MyChart. We now offer e-Visits for anyone 18 and older to request care online for non-urgent symptoms. For details visit [email or web address]. °  °Also download the MyChart app! Go to the app store, search "MyChart", open the app, select [facility], and log in with your MyChart username and password. ° °Due to Covid, a mask is required upon entering the hospital/clinic. If you do not have a mask, one will be given to you upon arrival. For doctor visits, patients may have 1 support person aged 18 or older with them. For treatment visits, patients cannot have anyone with them due to current Covid guidelines and our immunocompromised population.  ° °Cisplatin injection °What is this medication? °CISPLATIN (SIS pla tin) is a chemotherapy drug. It targets fast dividing cells, like cancer cells, and causes these cells to die. This medicine is used to treat many types of cancer like bladder, ovarian, and testicular cancers. °This medicine may be used   for other purposes; ask your health care provider or pharmacist if you have questions. °COMMON BRAND NAME(S): Platinol, Platinol -AQ °What should I tell my care team before I take this medication? °They need to know if you have any of these conditions: °eye disease, vision problems °hearing problems °kidney  disease °low blood counts, like white cells, platelets, or red blood cells °tingling of the fingers or toes, or other nerve disorder °an unusual or allergic reaction to cisplatin, carboplatin, oxaliplatin, other medicines, foods, dyes, or preservatives °pregnant or trying to get pregnant °breast-feeding °How should I use this medication? °This drug is given as an infusion into a vein. It is administered in a hospital or clinic by a specially trained health care professional. °Talk to your pediatrician regarding the use of this medicine in children. Special care may be needed. °Overdosage: If you think you have taken too much of this medicine contact a poison control center or emergency room at once. °NOTE: This medicine is only for you. Do not share this medicine with others. °What if I miss a dose? °It is important not to miss a dose. Call your doctor or health care professional if you are unable to keep an appointment. °What may interact with this medication? °This medicine may interact with the following medications: °foscarnet °certain antibiotics like amikacin, gentamicin, neomycin, polymyxin B, streptomycin, tobramycin, vancomycin °This list may not describe all possible interactions. Give your health care provider a list of all the medicines, herbs, non-prescription drugs, or dietary supplements you use. Also tell them if you smoke, drink alcohol, or use illegal drugs. Some items may interact with your medicine. °What should I watch for while using this medication? °Your condition will be monitored carefully while you are receiving this medicine. You will need important blood work done while you are taking this medicine. °This drug may make you feel generally unwell. This is not uncommon, as chemotherapy can affect healthy cells as well as cancer cells. Report any side effects. Continue your course of treatment even though you feel ill unless your doctor tells you to stop. °This medicine may increase your  risk of getting an infection. Call your healthcare professional for advice if you get a fever, chills, or sore throat, or other symptoms of a cold or flu. Do not treat yourself. Try to avoid being around people who are sick. °Avoid taking medicines that contain aspirin, acetaminophen, ibuprofen, naproxen, or ketoprofen unless instructed by your healthcare professional. These medicines may hide a fever. °This medicine may increase your risk to bruise or bleed. Call your doctor or health care professional if you notice any unusual bleeding. °Be careful brushing and flossing your teeth or using a toothpick because you may get an infection or bleed more easily. If you have any dental work done, tell your dentist you are receiving this medicine. °Do not become pregnant while taking this medicine or for 14 months after stopping it. Women should inform their healthcare professional if they wish to become pregnant or think they might be pregnant. Men should not father a child while taking this medicine and for 11 months after stopping it. There is potential for serious side effects to an unborn child. Talk to your healthcare professional for more information. °Do not breast-feed an infant while taking this medicine. °This medicine has caused ovarian failure in some women. This medicine may make it more difficult to get pregnant. Talk to your healthcare professional if you are concerned about your fertility. °This medicine has caused   decreased sperm counts in some men. This may make it more difficult to father a child. Talk to your healthcare professional if you are concerned about your fertility. °Drink fluids as directed while you are taking this medicine. This will help protect your kidneys. °Call your doctor or health care professional if you get diarrhea. Do not treat yourself. °What side effects may I notice from receiving this medication? °Side effects that you should report to your doctor or health care professional  as soon as possible: °allergic reactions like skin rash, itching or hives, swelling of the face, lips, or tongue °blurred vision °changes in vision °decreased hearing or ringing of the ears °nausea, vomiting °pain, redness, or irritation at site where injected °pain, tingling, numbness in the hands or feet °signs and symptoms of bleeding such as bloody or black, tarry stools; red or dark brown urine; spitting up blood or brown material that looks like coffee grounds; red spots on the skin; unusual bruising or bleeding from the eyes, gums, or nose °signs and symptoms of infection like fever; chills; cough; sore throat; pain or trouble passing urine °signs and symptoms of kidney injury like trouble passing urine or change in the amount of urine °signs and symptoms of low red blood cells or anemia such as unusually weak or tired; feeling faint or lightheaded; falls; breathing problems °Side effects that usually do not require medical attention (report to your doctor or health care professional if they continue or are bothersome): °loss of appetite °mouth sores °muscle cramps °This list may not describe all possible side effects. Call your doctor for medical advice about side effects. You may report side effects to FDA at 1-800-FDA-1088. °Where should I keep my medication? °This drug is given in a hospital or clinic and will not be stored at home. °NOTE: This sheet is a summary. It may not cover all possible information. If you have questions about this medicine, talk to your doctor, pharmacist, or health care provider. °© 2022 Elsevier/Gold Standard (2021-06-14 00:00:00) ° ° °

## 2021-09-07 NOTE — Progress Notes (Signed)
Per MD OK to infuse post hydration fluids with Cisplatin infusion so pt can be at this radiation appt on time.

## 2021-09-08 ENCOUNTER — Ambulatory Visit (INDEPENDENT_AMBULATORY_CARE_PROVIDER_SITE_OTHER): Payer: Medicare Other | Admitting: Cardiology

## 2021-09-08 ENCOUNTER — Encounter: Payer: Self-pay | Admitting: *Deleted

## 2021-09-08 ENCOUNTER — Encounter: Payer: Self-pay | Admitting: Cardiology

## 2021-09-08 ENCOUNTER — Telehealth: Payer: Self-pay | Admitting: Cardiology

## 2021-09-08 ENCOUNTER — Other Ambulatory Visit: Payer: Self-pay

## 2021-09-08 ENCOUNTER — Ambulatory Visit
Admission: RE | Admit: 2021-09-08 | Discharge: 2021-09-08 | Disposition: A | Discharge status: Home / Self Care | Payer: Medicare Other | Source: Ambulatory Visit | Attending: Radiation Oncology | Admitting: Radiation Oncology

## 2021-09-08 VITALS — BP 134/80 | HR 88 | Ht 72.0 in | Wt 197.4 lb

## 2021-09-08 DIAGNOSIS — R42 Dizziness and giddiness: Secondary | ICD-10-CM

## 2021-09-08 DIAGNOSIS — I4891 Unspecified atrial fibrillation: Secondary | ICD-10-CM | POA: Diagnosis not present

## 2021-09-08 DIAGNOSIS — C675 Malignant neoplasm of bladder neck: Secondary | ICD-10-CM | POA: Diagnosis not present

## 2021-09-08 DIAGNOSIS — C670 Malignant neoplasm of trigone of bladder: Secondary | ICD-10-CM | POA: Insufficient documentation

## 2021-09-08 DIAGNOSIS — I38 Endocarditis, valve unspecified: Secondary | ICD-10-CM | POA: Diagnosis not present

## 2021-09-08 DIAGNOSIS — Z510 Encounter for antineoplastic radiation therapy: Secondary | ICD-10-CM | POA: Insufficient documentation

## 2021-09-08 DIAGNOSIS — C678 Malignant neoplasm of overlapping sites of bladder: Secondary | ICD-10-CM | POA: Insufficient documentation

## 2021-09-08 DIAGNOSIS — Z79899 Other long term (current) drug therapy: Secondary | ICD-10-CM | POA: Insufficient documentation

## 2021-09-08 DIAGNOSIS — Z5111 Encounter for antineoplastic chemotherapy: Secondary | ICD-10-CM | POA: Insufficient documentation
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
## 2021-09-08 NOTE — Patient Instructions (Signed)
Medication Instructions:  Continue all current medications.  Labwork: none  Testing/Procedures: Your physician has recommended that you wear a 14 day event monitor. Event monitors are medical devices that record the heart's electrical activity. Doctors most often [place] these monitors to diagnose arrhythmias. Arrhythmias are problems with the speed or rhythm of the heartbeat. The monitor is a small, portable device. You can wear one while you do your normal daily activities. This is usually used to diagnose what is causing palpitations/syncope (passing out). Office will contact with results via phone or letter.    Follow-Up: Your physician wants you to follow up in: 6 months.  You will receive a reminder letter in the mail one-two months in advance.  If you don't receive a letter, please call our office to schedule the follow up appointment   Any Other Special Instructions Will Be Listed Below (If Applicable).   If you need a refill on your cardiac medications before your next appointment, please call your pharmacy.

## 2021-09-08 NOTE — Telephone Encounter (Signed)
PERCERT:   14 DAY ZIO  

## 2021-09-09 ENCOUNTER — Ambulatory Visit
Admission: RE | Admit: 2021-09-09 | Discharge: 2021-09-09 | Disposition: A | Discharge status: Home / Self Care | Payer: Medicare Other | Source: Ambulatory Visit | Attending: Radiation Oncology | Admitting: Radiation Oncology

## 2021-09-09 ENCOUNTER — Ambulatory Visit (INDEPENDENT_AMBULATORY_CARE_PROVIDER_SITE_OTHER): Payer: Medicare Other

## 2021-09-09 ENCOUNTER — Other Ambulatory Visit: Payer: Self-pay | Admitting: Dermatology

## 2021-09-09 DIAGNOSIS — Z510 Encounter for antineoplastic radiation therapy: Secondary | ICD-10-CM | POA: Diagnosis not present

## 2021-09-09 DIAGNOSIS — Z79899 Other long term (current) drug therapy: Secondary | ICD-10-CM | POA: Diagnosis not present

## 2021-09-09 DIAGNOSIS — C670 Malignant neoplasm of trigone of bladder: Secondary | ICD-10-CM | POA: Diagnosis not present

## 2021-09-09 DIAGNOSIS — R42 Dizziness and giddiness: Secondary | ICD-10-CM

## 2021-09-09 DIAGNOSIS — C675 Malignant neoplasm of bladder neck: Secondary | ICD-10-CM | POA: Diagnosis not present

## 2021-09-09 DIAGNOSIS — C678 Malignant neoplasm of overlapping sites of bladder: Secondary | ICD-10-CM | POA: Diagnosis not present

## 2021-09-09 DIAGNOSIS — Z5111 Encounter for antineoplastic chemotherapy: Secondary | ICD-10-CM | POA: Diagnosis not present

## 2021-09-12 ENCOUNTER — Ambulatory Visit
Admission: RE | Admit: 2021-09-12 | Discharge: 2021-09-12 | Disposition: A | Discharge status: Home / Self Care | Payer: Medicare Other | Source: Ambulatory Visit | Attending: Radiation Oncology | Admitting: Radiation Oncology

## 2021-09-12 ENCOUNTER — Other Ambulatory Visit: Payer: Self-pay

## 2021-09-12 DIAGNOSIS — Z5111 Encounter for antineoplastic chemotherapy: Secondary | ICD-10-CM | POA: Diagnosis not present

## 2021-09-12 DIAGNOSIS — C675 Malignant neoplasm of bladder neck: Secondary | ICD-10-CM | POA: Diagnosis not present

## 2021-09-12 DIAGNOSIS — Z510 Encounter for antineoplastic radiation therapy: Secondary | ICD-10-CM | POA: Diagnosis not present

## 2021-09-12 DIAGNOSIS — C670 Malignant neoplasm of trigone of bladder: Secondary | ICD-10-CM | POA: Diagnosis not present

## 2021-09-12 DIAGNOSIS — Z79899 Other long term (current) drug therapy: Secondary | ICD-10-CM | POA: Diagnosis not present

## 2021-09-12 DIAGNOSIS — C678 Malignant neoplasm of overlapping sites of bladder: Secondary | ICD-10-CM | POA: Diagnosis not present

## 2021-09-13 ENCOUNTER — Ambulatory Visit
Admission: RE | Admit: 2021-09-13 | Discharge: 2021-09-13 | Disposition: A | Discharge status: Home / Self Care | Payer: Medicare Other | Source: Ambulatory Visit | Attending: Radiation Oncology | Admitting: Radiation Oncology

## 2021-09-13 DIAGNOSIS — Z510 Encounter for antineoplastic radiation therapy: Secondary | ICD-10-CM | POA: Diagnosis not present

## 2021-09-13 DIAGNOSIS — C670 Malignant neoplasm of trigone of bladder: Secondary | ICD-10-CM | POA: Diagnosis not present

## 2021-09-13 DIAGNOSIS — C675 Malignant neoplasm of bladder neck: Secondary | ICD-10-CM | POA: Diagnosis not present

## 2021-09-13 DIAGNOSIS — Z5111 Encounter for antineoplastic chemotherapy: Secondary | ICD-10-CM | POA: Diagnosis not present

## 2021-09-13 DIAGNOSIS — Z79899 Other long term (current) drug therapy: Secondary | ICD-10-CM | POA: Diagnosis not present

## 2021-09-13 DIAGNOSIS — R42 Dizziness and giddiness: Secondary | ICD-10-CM | POA: Diagnosis not present

## 2021-09-13 DIAGNOSIS — C678 Malignant neoplasm of overlapping sites of bladder: Secondary | ICD-10-CM | POA: Diagnosis not present

## 2021-09-14 ENCOUNTER — Other Ambulatory Visit: Payer: Self-pay

## 2021-09-14 ENCOUNTER — Ambulatory Visit
Admission: RE | Admit: 2021-09-14 | Discharge: 2021-09-14 | Disposition: A | Discharge status: Home / Self Care | Payer: Medicare Other | Source: Ambulatory Visit | Attending: Radiation Oncology | Admitting: Radiation Oncology

## 2021-09-14 DIAGNOSIS — C678 Malignant neoplasm of overlapping sites of bladder: Secondary | ICD-10-CM | POA: Diagnosis not present

## 2021-09-14 DIAGNOSIS — C675 Malignant neoplasm of bladder neck: Secondary | ICD-10-CM | POA: Diagnosis not present

## 2021-09-14 DIAGNOSIS — Z510 Encounter for antineoplastic radiation therapy: Secondary | ICD-10-CM | POA: Diagnosis not present

## 2021-09-14 DIAGNOSIS — C670 Malignant neoplasm of trigone of bladder: Secondary | ICD-10-CM | POA: Diagnosis not present

## 2021-09-14 DIAGNOSIS — Z79899 Other long term (current) drug therapy: Secondary | ICD-10-CM | POA: Diagnosis not present

## 2021-09-14 DIAGNOSIS — Z5111 Encounter for antineoplastic chemotherapy: Secondary | ICD-10-CM | POA: Diagnosis not present

## 2021-09-14 MED FILL — Dexamethasone Sodium Phosphate Inj 100 MG/10ML: INTRAMUSCULAR | Qty: 1 | Status: AC

## 2021-09-14 MED FILL — Fosaprepitant Dimeglumine For IV Infusion 150 MG (Base Eq): INTRAVENOUS | Qty: 5 | Status: AC

## 2021-09-15 ENCOUNTER — Inpatient Hospital Stay: Payer: Medicare Other | Attending: Oncology

## 2021-09-15 ENCOUNTER — Inpatient Hospital Stay: Payer: Medicare Other

## 2021-09-15 ENCOUNTER — Ambulatory Visit
Admission: RE | Admit: 2021-09-15 | Discharge: 2021-09-15 | Disposition: A | Discharge status: Home / Self Care | Payer: Medicare Other | Source: Ambulatory Visit | Attending: Radiation Oncology | Admitting: Radiation Oncology

## 2021-09-15 VITALS — BP 148/93 | HR 51 | Temp 98.2°F | Resp 18 | Wt 196.5 lb

## 2021-09-15 DIAGNOSIS — Z79899 Other long term (current) drug therapy: Secondary | ICD-10-CM | POA: Insufficient documentation

## 2021-09-15 DIAGNOSIS — Z510 Encounter for antineoplastic radiation therapy: Secondary | ICD-10-CM | POA: Insufficient documentation

## 2021-09-15 DIAGNOSIS — Z5111 Encounter for antineoplastic chemotherapy: Secondary | ICD-10-CM | POA: Diagnosis not present

## 2021-09-15 DIAGNOSIS — C670 Malignant neoplasm of trigone of bladder: Secondary | ICD-10-CM | POA: Insufficient documentation

## 2021-09-15 DIAGNOSIS — C679 Malignant neoplasm of bladder, unspecified: Secondary | ICD-10-CM

## 2021-09-15 DIAGNOSIS — C678 Malignant neoplasm of overlapping sites of bladder: Secondary | ICD-10-CM | POA: Insufficient documentation

## 2021-09-15 DIAGNOSIS — C675 Malignant neoplasm of bladder neck: Secondary | ICD-10-CM | POA: Diagnosis not present

## 2021-09-15 LAB — CMP (CANCER CENTER ONLY)
ALT: 16 U/L (ref 0–44)
AST: 19 U/L (ref 15–41)
Albumin: 3.9 g/dL (ref 3.5–5.0)
Alkaline Phosphatase: 65 U/L (ref 38–126)
Anion gap: 11 (ref 5–15)
BUN: 11 mg/dL (ref 8–23)
CO2: 25 mmol/L (ref 22–32)
Calcium: 9.5 mg/dL (ref 8.9–10.3)
Chloride: 107 mmol/L (ref 98–111)
Creatinine: 0.77 mg/dL (ref 0.61–1.24)
GFR, Estimated: >60 mL/min (ref >60)
Glucose, Bld: 81 mg/dL (ref 70–99)
Potassium: 4 mmol/L (ref 3.5–5.1)
Sodium: 143 mmol/L (ref 135–145)
Total Bilirubin: 0.7 mg/dL (ref 0.3–1.2)
Total Protein: 7.1 g/dL (ref 6.5–8.1)

## 2021-09-15 LAB — CBC WITH DIFFERENTIAL (CANCER CENTER ONLY)
Abs Immature Granulocytes: 0.01 10*3/uL (ref 0.00–0.07)
Basophils Absolute: 0 10*3/uL (ref 0.0–0.1)
Basophils Relative: 0 %
Eosinophils Absolute: 0.1 10*3/uL (ref 0.0–0.5)
Eosinophils Relative: 2 %
HCT: 43.2 % (ref 39.0–52.0)
Hemoglobin: 14.6 g/dL (ref 13.0–17.0)
Immature Granulocytes: 0 %
Lymphocytes Relative: 23 %
Lymphs Abs: 1.5 10*3/uL (ref 0.7–4.0)
MCH: 30.7 pg (ref 26.0–34.0)
MCHC: 33.8 g/dL (ref 30.0–36.0)
MCV: 90.8 fL (ref 80.0–100.0)
Monocytes Absolute: 0.7 10*3/uL (ref 0.1–1.0)
Monocytes Relative: 11 %
Neutro Abs: 4.1 10*3/uL (ref 1.7–7.7)
Neutrophils Relative %: 64 %
Platelet Count: 144 10*3/uL — ABNORMAL LOW (ref 150–400)
RBC: 4.76 MIL/uL (ref 4.22–5.81)
RDW: 14.3 % (ref 11.5–15.5)
WBC Count: 6.4 10*3/uL (ref 4.0–10.5)
nRBC: 0 % (ref 0.0–0.2)

## 2021-09-15 MED ORDER — SODIUM CHLORIDE 0.9 % IV SOLN
150.0000 mg | Freq: Once | INTRAVENOUS | Status: AC
  Administered 2021-09-15: 150 mg via INTRAVENOUS
  Filled 2021-09-15: qty 150

## 2021-09-15 MED ORDER — DEXAMETHASONE SODIUM PHOSPHATE 100 MG/10ML IJ SOLN
10.0000 mg | Freq: Once | INTRAMUSCULAR | Status: AC
  Administered 2021-09-15: 10 mg via INTRAVENOUS
  Filled 2021-09-15: qty 10

## 2021-09-15 MED ORDER — MAGNESIUM SULFATE 2 GM/50ML IV SOLN
2.0000 g | Freq: Once | INTRAVENOUS | Status: AC
  Administered 2021-09-15: 2 g via INTRAVENOUS
  Filled 2021-09-15: qty 50

## 2021-09-15 MED ORDER — SODIUM CHLORIDE 0.9 % IV SOLN: Freq: Once | INTRAVENOUS | Status: AC

## 2021-09-15 MED ORDER — SODIUM CHLORIDE 0.9 % IV SOLN
35.0000 mg/m2 | Freq: Once | INTRAVENOUS | Status: AC
  Administered 2021-09-15: 74 mg via INTRAVENOUS
  Filled 2021-09-15: qty 74

## 2021-09-15 MED ORDER — PALONOSETRON HCL INJECTION 0.25 MG/5ML
0.2500 mg | Freq: Once | INTRAVENOUS | Status: AC
  Administered 2021-09-15: 0.25 mg via INTRAVENOUS
  Filled 2021-09-15: qty 5

## 2021-09-15 MED ORDER — POTASSIUM CHLORIDE IN NACL 20-0.9 MEQ/L-% IV SOLN
Freq: Once | INTRAVENOUS | Status: AC
  Filled 2021-09-15: qty 1000

## 2021-09-16 ENCOUNTER — Ambulatory Visit
Admission: RE | Admit: 2021-09-16 | Discharge: 2021-09-16 | Disposition: A | Discharge status: Home / Self Care | Payer: Medicare Other | Source: Ambulatory Visit | Attending: Radiation Oncology | Admitting: Radiation Oncology

## 2021-09-16 ENCOUNTER — Other Ambulatory Visit: Payer: Self-pay

## 2021-09-16 DIAGNOSIS — Z5111 Encounter for antineoplastic chemotherapy: Secondary | ICD-10-CM | POA: Diagnosis not present

## 2021-09-16 DIAGNOSIS — C675 Malignant neoplasm of bladder neck: Secondary | ICD-10-CM | POA: Diagnosis not present

## 2021-09-16 DIAGNOSIS — C670 Malignant neoplasm of trigone of bladder: Secondary | ICD-10-CM | POA: Diagnosis not present

## 2021-09-16 DIAGNOSIS — C678 Malignant neoplasm of overlapping sites of bladder: Secondary | ICD-10-CM | POA: Diagnosis not present

## 2021-09-16 DIAGNOSIS — Z510 Encounter for antineoplastic radiation therapy: Secondary | ICD-10-CM | POA: Diagnosis not present

## 2021-09-16 DIAGNOSIS — Z79899 Other long term (current) drug therapy: Secondary | ICD-10-CM | POA: Diagnosis not present

## 2021-09-19 ENCOUNTER — Ambulatory Visit
Admission: RE | Admit: 2021-09-19 | Discharge: 2021-09-19 | Disposition: A | Discharge status: Home / Self Care | Payer: Medicare Other | Source: Ambulatory Visit | Attending: Radiation Oncology | Admitting: Radiation Oncology

## 2021-09-19 ENCOUNTER — Other Ambulatory Visit: Payer: Self-pay

## 2021-09-19 DIAGNOSIS — Z510 Encounter for antineoplastic radiation therapy: Secondary | ICD-10-CM | POA: Diagnosis not present

## 2021-09-19 DIAGNOSIS — Z5111 Encounter for antineoplastic chemotherapy: Secondary | ICD-10-CM | POA: Diagnosis not present

## 2021-09-19 DIAGNOSIS — C670 Malignant neoplasm of trigone of bladder: Secondary | ICD-10-CM | POA: Diagnosis not present

## 2021-09-19 DIAGNOSIS — C678 Malignant neoplasm of overlapping sites of bladder: Secondary | ICD-10-CM | POA: Diagnosis not present

## 2021-09-19 DIAGNOSIS — C675 Malignant neoplasm of bladder neck: Secondary | ICD-10-CM | POA: Diagnosis not present

## 2021-09-19 DIAGNOSIS — Z79899 Other long term (current) drug therapy: Secondary | ICD-10-CM | POA: Diagnosis not present

## 2021-09-20 ENCOUNTER — Ambulatory Visit
Admission: RE | Admit: 2021-09-20 | Discharge: 2021-09-20 | Disposition: A | Discharge status: Home / Self Care | Payer: Medicare Other | Source: Ambulatory Visit | Attending: Radiation Oncology | Admitting: Radiation Oncology

## 2021-09-20 DIAGNOSIS — Z5111 Encounter for antineoplastic chemotherapy: Secondary | ICD-10-CM | POA: Diagnosis not present

## 2021-09-20 DIAGNOSIS — C675 Malignant neoplasm of bladder neck: Secondary | ICD-10-CM | POA: Diagnosis not present

## 2021-09-20 DIAGNOSIS — C678 Malignant neoplasm of overlapping sites of bladder: Secondary | ICD-10-CM | POA: Diagnosis not present

## 2021-09-20 DIAGNOSIS — Z79899 Other long term (current) drug therapy: Secondary | ICD-10-CM | POA: Diagnosis not present

## 2021-09-20 DIAGNOSIS — C670 Malignant neoplasm of trigone of bladder: Secondary | ICD-10-CM | POA: Diagnosis not present

## 2021-09-20 DIAGNOSIS — Z510 Encounter for antineoplastic radiation therapy: Secondary | ICD-10-CM | POA: Diagnosis not present

## 2021-09-21 ENCOUNTER — Other Ambulatory Visit: Payer: Self-pay

## 2021-09-21 ENCOUNTER — Ambulatory Visit
Admission: RE | Admit: 2021-09-21 | Discharge: 2021-09-21 | Disposition: A | Discharge status: Home / Self Care | Payer: Medicare Other | Source: Ambulatory Visit | Attending: Radiation Oncology | Admitting: Radiation Oncology

## 2021-09-21 DIAGNOSIS — C670 Malignant neoplasm of trigone of bladder: Secondary | ICD-10-CM | POA: Diagnosis not present

## 2021-09-21 DIAGNOSIS — Z5111 Encounter for antineoplastic chemotherapy: Secondary | ICD-10-CM | POA: Diagnosis not present

## 2021-09-21 DIAGNOSIS — Z79899 Other long term (current) drug therapy: Secondary | ICD-10-CM | POA: Diagnosis not present

## 2021-09-21 DIAGNOSIS — Z510 Encounter for antineoplastic radiation therapy: Secondary | ICD-10-CM | POA: Diagnosis not present

## 2021-09-21 DIAGNOSIS — C675 Malignant neoplasm of bladder neck: Secondary | ICD-10-CM | POA: Diagnosis not present

## 2021-09-21 DIAGNOSIS — C678 Malignant neoplasm of overlapping sites of bladder: Secondary | ICD-10-CM | POA: Diagnosis not present

## 2021-09-22 ENCOUNTER — Inpatient Hospital Stay (HOSPITAL_BASED_OUTPATIENT_CLINIC_OR_DEPARTMENT_OTHER): Payer: Medicare Other | Admitting: Oncology

## 2021-09-22 ENCOUNTER — Ambulatory Visit
Admission: RE | Admit: 2021-09-22 | Discharge: 2021-09-22 | Disposition: A | Discharge status: Home / Self Care | Payer: Medicare Other | Source: Ambulatory Visit | Attending: Radiation Oncology | Admitting: Radiation Oncology

## 2021-09-22 ENCOUNTER — Inpatient Hospital Stay: Payer: Medicare Other

## 2021-09-22 VITALS — BP 124/84 | HR 78 | Temp 98.2°F | Resp 17 | Ht 72.0 in | Wt 195.3 lb

## 2021-09-22 DIAGNOSIS — C675 Malignant neoplasm of bladder neck: Secondary | ICD-10-CM | POA: Diagnosis not present

## 2021-09-22 DIAGNOSIS — Z5111 Encounter for antineoplastic chemotherapy: Secondary | ICD-10-CM | POA: Diagnosis not present

## 2021-09-22 DIAGNOSIS — Z510 Encounter for antineoplastic radiation therapy: Secondary | ICD-10-CM | POA: Diagnosis not present

## 2021-09-22 DIAGNOSIS — C679 Malignant neoplasm of bladder, unspecified: Secondary | ICD-10-CM

## 2021-09-22 DIAGNOSIS — C670 Malignant neoplasm of trigone of bladder: Secondary | ICD-10-CM | POA: Diagnosis not present

## 2021-09-22 DIAGNOSIS — Z79899 Other long term (current) drug therapy: Secondary | ICD-10-CM | POA: Diagnosis not present

## 2021-09-22 DIAGNOSIS — C678 Malignant neoplasm of overlapping sites of bladder: Secondary | ICD-10-CM | POA: Diagnosis not present

## 2021-09-22 LAB — CBC WITH DIFFERENTIAL (CANCER CENTER ONLY)
Abs Immature Granulocytes: 0.01 10*3/uL (ref 0.00–0.07)
Basophils Absolute: 0 10*3/uL (ref 0.0–0.1)
Basophils Relative: 0 %
Eosinophils Absolute: 0 10*3/uL (ref 0.0–0.5)
Eosinophils Relative: 1 %
HCT: 42.1 % (ref 39.0–52.0)
Hemoglobin: 14.1 g/dL (ref 13.0–17.0)
Immature Granulocytes: 0 %
Lymphocytes Relative: 20 %
Lymphs Abs: 1.2 10*3/uL (ref 0.7–4.0)
MCH: 30.3 pg (ref 26.0–34.0)
MCHC: 33.5 g/dL (ref 30.0–36.0)
MCV: 90.3 fL (ref 80.0–100.0)
Monocytes Absolute: 0.6 10*3/uL (ref 0.1–1.0)
Monocytes Relative: 9 %
Neutro Abs: 4.4 10*3/uL (ref 1.7–7.7)
Neutrophils Relative %: 70 %
Platelet Count: 106 10*3/uL — ABNORMAL LOW (ref 150–400)
RBC: 4.66 MIL/uL (ref 4.22–5.81)
RDW: 14.3 % (ref 11.5–15.5)
WBC Count: 6.2 10*3/uL (ref 4.0–10.5)
nRBC: 0 % (ref 0.0–0.2)

## 2021-09-22 LAB — CMP (CANCER CENTER ONLY)
ALT: 19 U/L (ref 0–44)
AST: 18 U/L (ref 15–41)
Albumin: 3.8 g/dL (ref 3.5–5.0)
Alkaline Phosphatase: 62 U/L (ref 38–126)
Anion gap: 10 (ref 5–15)
BUN: 12 mg/dL (ref 8–23)
CO2: 24 mmol/L (ref 22–32)
Calcium: 9.2 mg/dL (ref 8.9–10.3)
Chloride: 107 mmol/L (ref 98–111)
Creatinine: 0.77 mg/dL (ref 0.61–1.24)
GFR, Estimated: >60 mL/min (ref >60)
Glucose, Bld: 89 mg/dL (ref 70–99)
Potassium: 4.1 mmol/L (ref 3.5–5.1)
Sodium: 141 mmol/L (ref 135–145)
Total Bilirubin: 1.1 mg/dL (ref 0.3–1.2)
Total Protein: 6.9 g/dL (ref 6.5–8.1)

## 2021-09-22 LAB — MAGNESIUM: Magnesium: 1.3 mg/dL — ABNORMAL LOW (ref 1.7–2.4)

## 2021-09-22 MED ORDER — DEXAMETHASONE SODIUM PHOSPHATE 100 MG/10ML IJ SOLN
10.0000 mg | Freq: Once | INTRAMUSCULAR | Status: AC
  Administered 2021-09-22: 10 mg via INTRAVENOUS
  Filled 2021-09-22: qty 10

## 2021-09-22 MED ORDER — MAGNESIUM SULFATE 2 GM/50ML IV SOLN
2.0000 g | Freq: Once | INTRAVENOUS | Status: AC
  Administered 2021-09-22: 2 g via INTRAVENOUS
  Filled 2021-09-22: qty 50

## 2021-09-22 MED ORDER — SODIUM CHLORIDE 0.9 % IV SOLN
35.0000 mg/m2 | Freq: Once | INTRAVENOUS | Status: AC
  Administered 2021-09-22: 74 mg via INTRAVENOUS
  Filled 2021-09-22: qty 74

## 2021-09-22 MED ORDER — POTASSIUM CHLORIDE IN NACL 20-0.9 MEQ/L-% IV SOLN
Freq: Once | INTRAVENOUS | Status: AC
  Filled 2021-09-22: qty 1000

## 2021-09-22 MED ORDER — SODIUM CHLORIDE 0.9 % IV SOLN
150.0000 mg | Freq: Once | INTRAVENOUS | Status: AC
  Administered 2021-09-22: 150 mg via INTRAVENOUS
  Filled 2021-09-22: qty 150

## 2021-09-22 MED ORDER — SODIUM CHLORIDE 0.9 % IV SOLN: Freq: Once | INTRAVENOUS | Status: AC

## 2021-09-22 MED ORDER — PALONOSETRON HCL INJECTION 0.25 MG/5ML
0.2500 mg | Freq: Once | INTRAVENOUS | Status: AC
  Administered 2021-09-22: 0.25 mg via INTRAVENOUS
  Filled 2021-09-22: qty 5

## 2021-09-22 NOTE — Progress Notes (Signed)
Hematology and Oncology Follow Up Visit  JOVANNY STEPHANIE [id number] 12-Dec-1950 70 y.o. 09/22/2021 8:10 AM Glenda Chroman, MDVyas, Costella Hatcher, MD   Principle Diagnosis: 70 year old with bladder cancer diagnosed in September 2022.  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  Prior Therapy: He is status post TURBT on June 17, 2021. 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    Current therapy: Definitive therapy with radiation and weekly cisplatin.  He is here for cycle 3 of therapy.  Interim History: Mr. Lipscomb presents today for a follow-up evaluation.  Since the last visit, he has not been receiving definitive therapy with radiation concomitantly with chemotherapy without any major complaints.  He denies any nausea, vomiting or abdominal pain.  He denies any recent hospitalizations or illnesses.  He denies any worsening neuropathy or excessive fatigue.  He he reports bladder irritation and occasional dysuria.     Medications: Updated on review. Current Outpatient Medications  Medication Sig Dispense Refill   acetaminophen (TYLENOL) 500 MG tablet Take 500-1,000 mg by mouth every 6 (six) hours as needed for moderate pain.     alendronate (FOSAMAX) 70 MG tablet Take 70 mg by mouth once a week. Takes on Thursday's ;  Take with a full glass of water on an empty stomach.     apixaban (ELIQUIS) 5 MG TABS tablet Take 1 tablet (5 mg total) by mouth 2 (two) times daily. (Patient not taking: Reported on 09/08/2021) 60 tablet 0   Ascorbic Acid (VITAMIN C) 1000 MG tablet Take 1,000 mg by mouth 2 (two) times daily.     calcium-vitamin D (OSCAL WITH D) 500-200 MG-UNIT tablet Take 1 tablet by mouth 2 (two) times daily.     Magnesium 250 MG TABS Take 250 mg by mouth daily.     Multiple Vitamins-Minerals (MULTIVITAMIN PO) Take 1 tablet by  mouth daily.     omeprazole (PRILOSEC) 20 MG capsule Take 20 mg by mouth daily.     Polyethyl Glycol-Propyl Glycol (SYSTANE OP) Place 1 drop into both eyes daily as needed (dry eyes).     prochlorperazine (COMPAZINE) 10 MG tablet Take 1 tablet (10 mg total) by mouth every 6 (six) hours as needed for nausea or vomiting. 30 tablet 0   prochlorperazine (COMPAZINE) 10 MG tablet Take 1 tablet (10 mg total) by mouth every 6 (six) hours as needed for nausea or vomiting. 30 tablet 0   pseudoephedrine (SUDAFED) 30 MG tablet Take 30 mg by mouth daily as needed for congestion.     sildenafil (REVATIO) 20 MG tablet Take 20-60 mg by mouth daily as needed (erectile dysfunction).     tiZANidine (ZANAFLEX) 4 MG capsule Take 4 mg by mouth 2 (two) times daily as needed for muscle spasms.     No current facility-administered medications for this visit.     Allergies: No Known Allergies    Physical Exam:  Blood pressure 124/84, pulse 78, temperature 98.2 F (36.8 C), temperature source Temporal, resp. rate 17, height 6' (1.829 m), weight 195 lb 4.8 oz (88.6 kg), SpO2 98 %.  ECOG: 0    General appearance: Comfortable appearing without any discomfort Head: Normocephalic without any trauma Oropharynx: Mucous membranes are moist and pink without any thrush or ulcers. Eyes: Pupils are equal and round reactive to light. Lymph nodes: No cervical, supraclavicular, inguinal or axillary  lymphadenopathy.   Heart:regular rate and rhythm.  S1 and S2 without leg edema. Lung: Clear without any rhonchi or wheezes.  No dullness to percussion. Abdomin: Soft, nontender, nondistended with good bowel sounds.  No hepatosplenomegaly. Musculoskeletal: No joint deformity or effusion.  Full range of motion noted. Neurological: No deficits noted on motor, sensory and deep tendon reflex exam. Skin: No petechial rash or dryness.  Appeared moist.       Lab Results: Lab Results  Component Value Date   WBC 6.4 09/15/2021    HGB 14.6 09/15/2021   HCT 43.2 09/15/2021   MCV 90.8 09/15/2021   PLT 144 (L) 09/15/2021     Chemistry      Component Value Date/Time   NA 143 09/15/2021 0848   K 4.0 09/15/2021 0848   CL 107 09/15/2021 0848   CO2 25 09/15/2021 0848   BUN 11 09/15/2021 0848   CREATININE 0.77 09/15/2021 0848   CREATININE 0.78 07/19/2021 1019      Component Value Date/Time   CALCIUM 9.5 09/15/2021 0848   ALKPHOS 65 09/15/2021 0848   AST 19 09/15/2021 0848   ALT 16 09/15/2021 0848   BILITOT 0.7 09/15/2021 0848         Impression and Plan:  70 year old with:  1.  Bladder cancer diagnosed in September 2022.  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    He is receiving definitive therapy with radiation and weekly cisplatin without complications.  Risks and benefits of continuing this treatment were reviewed.  Complications such as nausea, vomiting, myelosuppression and neuropathy was reiterated.  He is agreeable to proceed at this time.     2.  IV access: No Port-A-Cath is needed at this time.  Peripheral veins currently in use.   3.  Antiemetics: Compazine is available to him without any nausea or vomiting.   4.  Renal function surveillance: Creatinine clearance remains normal on platinum therapy.  This will continue to be monitored.   5.  Goals of care: Therapy remains curative with aggressive measures are warranted.   6.  Follow-up: He will return weekly for cisplatin and 3 weeks for repeat evaluation.   30  minutes were dedicated to this visit.  Time was spent on reviewing laboratory data, disease status update and outlining future plan of care.     Zola Button, MD 12/15/20228:10 AM

## 2021-09-22 NOTE — Progress Notes (Signed)
MD aware of Mag 1.3. No orders given.

## 2021-09-22 NOTE — Patient Instructions (Signed)
[facility]  Discharge Instructions: Thank you for choosing [facility] to provide your oncology and hematology care.   If you have a lab appointment with the Cancer Center, please go directly to the Cancer Center and check in at the registration area.   Wear comfortable clothing and clothing appropriate for easy access to any Portacath or PICC line.   We strive to give you quality time with your provider. You may need to reschedule your appointment if you arrive late (15 or more minutes).  Arriving late affects you and other patients whose appointments are after yours.  Also, if you miss three or more appointments without notifying the office, you may be dismissed from the clinic at the provider's discretion.      For prescription refill requests, have your pharmacy contact our office and allow 72 hours for refills to be completed.    Today you received the following chemotherapy and/or immunotherapy agents cisplatin   To help prevent nausea and vomiting after your treatment, we encourage you to take your nausea medication as directed.  BELOW ARE SYMPTOMS THAT SHOULD BE REPORTED IMMEDIATELY: *FEVER GREATER THAN 100.4 F (38 C) OR HIGHER *CHILLS OR SWEATING *NAUSEA AND VOMITING THAT IS NOT CONTROLLED WITH YOUR NAUSEA MEDICATION *UNUSUAL SHORTNESS OF BREATH *UNUSUAL BRUISING OR BLEEDING *URINARY PROBLEMS (pain or burning when urinating, or frequent urination) *BOWEL PROBLEMS (unusual diarrhea, constipation, pain near the anus) TENDERNESS IN MOUTH AND THROAT WITH OR WITHOUT PRESENCE OF ULCERS (sore throat, sores in mouth, or a toothache) UNUSUAL RASH, SWELLING OR PAIN  UNUSUAL VAGINAL DISCHARGE OR ITCHING   Items with * indicate a potential emergency and should be followed up as soon as possible or go to the Emergency Department if any problems should occur.  Please show the CHEMOTHERAPY ALERT CARD or IMMUNOTHERAPY ALERT CARD at check-in to the  Emergency Department and triage nurse.  Should you have questions after your visit or need to cancel or reschedule your appointment, please contact [facility]  Dept: [phone]  and follow the prompts.  Office hours are 8:00 a.m. to 4:30 p.m. Monday - Friday. Please note that voicemails left after 4:00 p.m. may not be returned until the following business day.  We are closed weekends and major holidays. You have access to a nurse at all times for urgent questions. Please call the main number to the clinic Dept: [phone] and follow the prompts.   For any non-urgent questions, you may also contact your provider using MyChart. We now offer e-Visits for anyone 18 and older to request care online for non-urgent symptoms. For details visit [email or web address].   Also download the MyChart app! Go to the app store, search "MyChart", open the app, select [facility], and log in with your MyChart username and password.  Due to Covid, a mask is required upon entering the hospital/clinic. If you do not have a mask, one will be given to you upon arrival. For doctor visits, patients may have 1 support person aged 18 or older with them. For treatment visits, patients cannot have anyone with them due to current Covid guidelines and our immunocompromised population.   

## 2021-09-23 ENCOUNTER — Ambulatory Visit
Admission: RE | Admit: 2021-09-23 | Discharge: 2021-09-23 | Disposition: A | Discharge status: Home / Self Care | Payer: Medicare Other | Source: Ambulatory Visit | Attending: Radiation Oncology | Admitting: Radiation Oncology

## 2021-09-23 ENCOUNTER — Other Ambulatory Visit: Payer: Self-pay

## 2021-09-23 DIAGNOSIS — Z79899 Other long term (current) drug therapy: Secondary | ICD-10-CM | POA: Diagnosis not present

## 2021-09-23 DIAGNOSIS — Z510 Encounter for antineoplastic radiation therapy: Secondary | ICD-10-CM | POA: Diagnosis not present

## 2021-09-23 DIAGNOSIS — Z5111 Encounter for antineoplastic chemotherapy: Secondary | ICD-10-CM | POA: Diagnosis not present

## 2021-09-23 DIAGNOSIS — C675 Malignant neoplasm of bladder neck: Secondary | ICD-10-CM | POA: Diagnosis not present

## 2021-09-23 DIAGNOSIS — C678 Malignant neoplasm of overlapping sites of bladder: Secondary | ICD-10-CM | POA: Diagnosis not present

## 2021-09-23 DIAGNOSIS — C670 Malignant neoplasm of trigone of bladder: Secondary | ICD-10-CM | POA: Diagnosis not present

## 2021-09-26 ENCOUNTER — Ambulatory Visit
Admission: RE | Admit: 2021-09-26 | Discharge: 2021-09-26 | Disposition: A | Discharge status: Home / Self Care | Payer: Medicare Other | Source: Ambulatory Visit | Attending: Radiation Oncology | Admitting: Radiation Oncology

## 2021-09-26 ENCOUNTER — Other Ambulatory Visit: Payer: Self-pay

## 2021-09-26 DIAGNOSIS — C675 Malignant neoplasm of bladder neck: Secondary | ICD-10-CM | POA: Diagnosis not present

## 2021-09-26 DIAGNOSIS — Z5111 Encounter for antineoplastic chemotherapy: Secondary | ICD-10-CM | POA: Diagnosis not present

## 2021-09-26 DIAGNOSIS — Z79899 Other long term (current) drug therapy: Secondary | ICD-10-CM | POA: Diagnosis not present

## 2021-09-26 DIAGNOSIS — C670 Malignant neoplasm of trigone of bladder: Secondary | ICD-10-CM | POA: Diagnosis not present

## 2021-09-26 DIAGNOSIS — C678 Malignant neoplasm of overlapping sites of bladder: Secondary | ICD-10-CM | POA: Diagnosis not present

## 2021-09-26 DIAGNOSIS — Z510 Encounter for antineoplastic radiation therapy: Secondary | ICD-10-CM | POA: Diagnosis not present

## 2021-09-27 ENCOUNTER — Ambulatory Visit
Admission: RE | Admit: 2021-09-27 | Discharge: 2021-09-27 | Disposition: A | Discharge status: Home / Self Care | Payer: Medicare Other | Source: Ambulatory Visit | Attending: Radiation Oncology | Admitting: Radiation Oncology

## 2021-09-27 DIAGNOSIS — C670 Malignant neoplasm of trigone of bladder: Secondary | ICD-10-CM | POA: Diagnosis not present

## 2021-09-27 DIAGNOSIS — Z5111 Encounter for antineoplastic chemotherapy: Secondary | ICD-10-CM | POA: Diagnosis not present

## 2021-09-27 DIAGNOSIS — Z510 Encounter for antineoplastic radiation therapy: Secondary | ICD-10-CM | POA: Diagnosis not present

## 2021-09-27 DIAGNOSIS — C678 Malignant neoplasm of overlapping sites of bladder: Secondary | ICD-10-CM | POA: Diagnosis not present

## 2021-09-27 DIAGNOSIS — Z79899 Other long term (current) drug therapy: Secondary | ICD-10-CM | POA: Diagnosis not present

## 2021-09-27 DIAGNOSIS — C675 Malignant neoplasm of bladder neck: Secondary | ICD-10-CM | POA: Diagnosis not present

## 2021-09-28 ENCOUNTER — Other Ambulatory Visit: Payer: Self-pay

## 2021-09-28 ENCOUNTER — Ambulatory Visit
Admission: RE | Admit: 2021-09-28 | Discharge: 2021-09-28 | Disposition: A | Discharge status: Home / Self Care | Payer: Medicare Other | Source: Ambulatory Visit | Attending: Radiation Oncology | Admitting: Radiation Oncology

## 2021-09-28 DIAGNOSIS — Z510 Encounter for antineoplastic radiation therapy: Secondary | ICD-10-CM | POA: Diagnosis not present

## 2021-09-28 DIAGNOSIS — C678 Malignant neoplasm of overlapping sites of bladder: Secondary | ICD-10-CM | POA: Diagnosis not present

## 2021-09-28 DIAGNOSIS — Z79899 Other long term (current) drug therapy: Secondary | ICD-10-CM | POA: Diagnosis not present

## 2021-09-28 DIAGNOSIS — Z5111 Encounter for antineoplastic chemotherapy: Secondary | ICD-10-CM | POA: Diagnosis not present

## 2021-09-28 DIAGNOSIS — C670 Malignant neoplasm of trigone of bladder: Secondary | ICD-10-CM | POA: Diagnosis not present

## 2021-09-28 DIAGNOSIS — C675 Malignant neoplasm of bladder neck: Secondary | ICD-10-CM | POA: Diagnosis not present

## 2021-09-29 ENCOUNTER — Other Ambulatory Visit: Payer: Self-pay | Admitting: *Deleted

## 2021-09-29 ENCOUNTER — Inpatient Hospital Stay: Payer: Medicare Other

## 2021-09-29 ENCOUNTER — Ambulatory Visit
Admission: RE | Admit: 2021-09-29 | Discharge: 2021-09-29 | Disposition: A | Discharge status: Home / Self Care | Payer: Medicare Other | Source: Ambulatory Visit | Attending: Radiation Oncology | Admitting: Radiation Oncology

## 2021-09-29 VITALS — BP 156/98 | HR 85 | Temp 97.6°F | Resp 16 | Wt 196.8 lb

## 2021-09-29 DIAGNOSIS — C679 Malignant neoplasm of bladder, unspecified: Secondary | ICD-10-CM

## 2021-09-29 DIAGNOSIS — Z79899 Other long term (current) drug therapy: Secondary | ICD-10-CM | POA: Diagnosis not present

## 2021-09-29 DIAGNOSIS — C678 Malignant neoplasm of overlapping sites of bladder: Secondary | ICD-10-CM | POA: Diagnosis not present

## 2021-09-29 DIAGNOSIS — C675 Malignant neoplasm of bladder neck: Secondary | ICD-10-CM | POA: Diagnosis not present

## 2021-09-29 DIAGNOSIS — Z5111 Encounter for antineoplastic chemotherapy: Secondary | ICD-10-CM | POA: Diagnosis not present

## 2021-09-29 DIAGNOSIS — Z510 Encounter for antineoplastic radiation therapy: Secondary | ICD-10-CM | POA: Diagnosis not present

## 2021-09-29 DIAGNOSIS — C670 Malignant neoplasm of trigone of bladder: Secondary | ICD-10-CM | POA: Diagnosis not present

## 2021-09-29 LAB — CBC WITH DIFFERENTIAL (CANCER CENTER ONLY)
Abs Immature Granulocytes: 0 10*3/uL (ref 0.00–0.07)
Basophils Absolute: 0 10*3/uL (ref 0.0–0.1)
Basophils Relative: 0 %
Eosinophils Absolute: 0 10*3/uL (ref 0.0–0.5)
Eosinophils Relative: 1 %
HCT: 37.6 % — ABNORMAL LOW (ref 39.0–52.0)
Hemoglobin: 12.6 g/dL — ABNORMAL LOW (ref 13.0–17.0)
Immature Granulocytes: 0 %
Lymphocytes Relative: 27 %
Lymphs Abs: 0.8 10*3/uL (ref 0.7–4.0)
MCH: 30.9 pg (ref 26.0–34.0)
MCHC: 33.5 g/dL (ref 30.0–36.0)
MCV: 92.2 fL (ref 80.0–100.0)
Monocytes Absolute: 0.3 10*3/uL (ref 0.1–1.0)
Monocytes Relative: 12 %
Neutro Abs: 1.7 10*3/uL (ref 1.7–7.7)
Neutrophils Relative %: 60 %
Platelet Count: 83 10*3/uL — ABNORMAL LOW (ref 150–400)
RBC: 4.08 MIL/uL — ABNORMAL LOW (ref 4.22–5.81)
RDW: 14.2 % (ref 11.5–15.5)
WBC Count: 2.8 10*3/uL — ABNORMAL LOW (ref 4.0–10.5)
nRBC: 0 % (ref 0.0–0.2)

## 2021-09-29 LAB — CMP (CANCER CENTER ONLY)
ALT: 14 U/L (ref 0–44)
AST: 18 U/L (ref 15–41)
Albumin: 3.8 g/dL (ref 3.5–5.0)
Alkaline Phosphatase: 51 U/L (ref 38–126)
Anion gap: 8 (ref 5–15)
BUN: 16 mg/dL (ref 8–23)
CO2: 28 mmol/L (ref 22–32)
Calcium: 8.7 mg/dL — ABNORMAL LOW (ref 8.9–10.3)
Chloride: 107 mmol/L (ref 98–111)
Creatinine: 0.77 mg/dL (ref 0.61–1.24)
GFR, Estimated: >60 mL/min (ref >60)
Glucose, Bld: 84 mg/dL (ref 70–99)
Potassium: 4.2 mmol/L (ref 3.5–5.1)
Sodium: 143 mmol/L (ref 135–145)
Total Bilirubin: 0.8 mg/dL (ref 0.3–1.2)
Total Protein: 6.5 g/dL (ref 6.5–8.1)

## 2021-09-29 LAB — MAGNESIUM: Magnesium: 1.2 mg/dL — ABNORMAL LOW (ref 1.7–2.4)

## 2021-09-29 MED ORDER — MAGNESIUM SULFATE 2 GM/50ML IV SOLN
2.0000 g | Freq: Once | INTRAVENOUS | Status: AC
  Administered 2021-09-29: 10:00:00 2 g via INTRAVENOUS
  Filled 2021-09-29: qty 50

## 2021-09-29 MED ORDER — SODIUM CHLORIDE 0.9 % IV SOLN: Freq: Once | INTRAVENOUS | Status: AC

## 2021-09-29 MED ORDER — SODIUM CHLORIDE 0.9 % IV SOLN
35.0000 mg/m2 | Freq: Once | INTRAVENOUS | Status: AC
  Administered 2021-09-29: 13:00:00 74 mg via INTRAVENOUS
  Filled 2021-09-29: qty 74

## 2021-09-29 MED ORDER — SODIUM CHLORIDE 0.9 % IV SOLN
150.0000 mg | Freq: Once | INTRAVENOUS | Status: AC
  Administered 2021-09-29: 12:00:00 150 mg via INTRAVENOUS
  Filled 2021-09-29: qty 150

## 2021-09-29 MED ORDER — SODIUM CHLORIDE 0.9 % IV SOLN
10.0000 mg | Freq: Once | INTRAVENOUS | Status: AC
  Administered 2021-09-29: 12:00:00 10 mg via INTRAVENOUS
  Filled 2021-09-29: qty 10

## 2021-09-29 MED ORDER — POTASSIUM CHLORIDE IN NACL 20-0.9 MEQ/L-% IV SOLN
Freq: Once | INTRAVENOUS | Status: AC
  Filled 2021-09-29: qty 1000

## 2021-09-29 MED ORDER — PALONOSETRON HCL INJECTION 0.25 MG/5ML
0.2500 mg | Freq: Once | INTRAVENOUS | Status: AC
  Administered 2021-09-29: 12:00:00 0.25 mg via INTRAVENOUS
  Filled 2021-09-29: qty 5

## 2021-09-29 NOTE — Patient Instructions (Signed)
[facility]  Discharge Instructions: Thank you for choosing [facility] to provide your oncology and hematology care.   If you have a lab appointment with the Cancer Center, please go directly to the Cancer Center and check in at the registration area.   Wear comfortable clothing and clothing appropriate for easy access to any Portacath or PICC line.   We strive to give you quality time with your provider. You may need to reschedule your appointment if you arrive late (15 or more minutes).  Arriving late affects you and other patients whose appointments are after yours.  Also, if you miss three or more appointments without notifying the office, you may be dismissed from the clinic at the provider's discretion.      For prescription refill requests, have your pharmacy contact our office and allow 72 hours for refills to be completed.    Today you received the following chemotherapy and/or immunotherapy agents : Cisplatin    To help prevent nausea and vomiting after your treatment, we encourage you to take your nausea medication as directed.  BELOW ARE SYMPTOMS THAT SHOULD BE REPORTED IMMEDIATELY: *FEVER GREATER THAN 100.4 F (38 C) OR HIGHER *CHILLS OR SWEATING *NAUSEA AND VOMITING THAT IS NOT CONTROLLED WITH YOUR NAUSEA MEDICATION *UNUSUAL SHORTNESS OF BREATH *UNUSUAL BRUISING OR BLEEDING *URINARY PROBLEMS (pain or burning when urinating, or frequent urination) *BOWEL PROBLEMS (unusual diarrhea, constipation, pain near the anus) TENDERNESS IN MOUTH AND THROAT WITH OR WITHOUT PRESENCE OF ULCERS (sore throat, sores in mouth, or a toothache) UNUSUAL RASH, SWELLING OR PAIN  UNUSUAL VAGINAL DISCHARGE OR ITCHING   Items with * indicate a potential emergency and should be followed up as soon as possible or go to the Emergency Department if any problems should occur.  Please show the CHEMOTHERAPY ALERT CARD or IMMUNOTHERAPY ALERT CARD at check-in to  the Emergency Department and triage nurse.  Should you have questions after your visit or need to cancel or reschedule your appointment, please contact [facility]  Dept: [phone]  and follow the prompts.  Office hours are 8:00 a.m. to 4:30 p.m. Monday - Friday. Please note that voicemails left after 4:00 p.m. may not be returned until the following business day.  We are closed weekends and major holidays. You have access to a nurse at all times for urgent questions. Please call the main number to the clinic Dept: [phone] and follow the prompts.   For any non-urgent questions, you may also contact your provider using MyChart. We now offer e-Visits for anyone 18 and older to request care online for non-urgent symptoms. For details visit [email or web address].   Also download the MyChart app! Go to the app store, search "MyChart", open the app, select [facility], and log in with your MyChart username and password.  Due to Covid, a mask is required upon entering the hospital/clinic. If you do not have a mask, one will be given to you upon arrival. For doctor visits, patients may have 1 support person aged 18 or older with them. For treatment visits, patients cannot have anyone with them due to current Covid guidelines and our immunocompromised population.   

## 2021-09-29 NOTE — Progress Notes (Signed)
Per MD OK to trt w/ Plts 86 today Per MD OK to proceed w/out mag level today, magnesium to be drawn by lab in inf.

## 2021-09-30 ENCOUNTER — Other Ambulatory Visit: Payer: Self-pay

## 2021-09-30 ENCOUNTER — Ambulatory Visit
Admission: RE | Admit: 2021-09-30 | Discharge: 2021-09-30 | Disposition: A | Discharge status: Home / Self Care | Payer: Medicare Other | Source: Ambulatory Visit | Attending: Radiation Oncology | Admitting: Radiation Oncology

## 2021-09-30 DIAGNOSIS — C670 Malignant neoplasm of trigone of bladder: Secondary | ICD-10-CM | POA: Diagnosis not present

## 2021-09-30 DIAGNOSIS — Z79899 Other long term (current) drug therapy: Secondary | ICD-10-CM | POA: Diagnosis not present

## 2021-09-30 DIAGNOSIS — C678 Malignant neoplasm of overlapping sites of bladder: Secondary | ICD-10-CM | POA: Diagnosis not present

## 2021-09-30 DIAGNOSIS — Z510 Encounter for antineoplastic radiation therapy: Secondary | ICD-10-CM | POA: Diagnosis not present

## 2021-09-30 DIAGNOSIS — Z5111 Encounter for antineoplastic chemotherapy: Secondary | ICD-10-CM | POA: Diagnosis not present

## 2021-09-30 DIAGNOSIS — C675 Malignant neoplasm of bladder neck: Secondary | ICD-10-CM | POA: Diagnosis not present

## 2021-10-04 ENCOUNTER — Telehealth: Payer: Self-pay | Admitting: *Deleted

## 2021-10-04 ENCOUNTER — Other Ambulatory Visit: Payer: Self-pay

## 2021-10-04 ENCOUNTER — Ambulatory Visit
Admission: RE | Admit: 2021-10-04 | Discharge: 2021-10-04 | Disposition: A | Discharge status: Home / Self Care | Payer: Medicare Other | Source: Ambulatory Visit | Attending: Radiation Oncology | Admitting: Radiation Oncology

## 2021-10-04 DIAGNOSIS — C670 Malignant neoplasm of trigone of bladder: Secondary | ICD-10-CM | POA: Diagnosis not present

## 2021-10-04 DIAGNOSIS — C678 Malignant neoplasm of overlapping sites of bladder: Secondary | ICD-10-CM | POA: Diagnosis not present

## 2021-10-04 DIAGNOSIS — Z510 Encounter for antineoplastic radiation therapy: Secondary | ICD-10-CM | POA: Diagnosis not present

## 2021-10-04 DIAGNOSIS — Z5111 Encounter for antineoplastic chemotherapy: Secondary | ICD-10-CM | POA: Diagnosis not present

## 2021-10-04 DIAGNOSIS — C675 Malignant neoplasm of bladder neck: Secondary | ICD-10-CM | POA: Diagnosis not present

## 2021-10-04 DIAGNOSIS — Z79899 Other long term (current) drug therapy: Secondary | ICD-10-CM | POA: Diagnosis not present

## 2021-10-04 NOTE — Telephone Encounter (Signed)
PC to patient, he spoke with RN in [facility] lobby earlier - stated he had tx last Thursday & has had ringing in his ears, some confusion & extreme diarrhea ever since his tx.  Is having 10-12 episodes of diarrhea/day, is taking immodium but it isn't helping.  Dr. Alen Blew informed, he advises that patient be seen at ED.  Patient informed, he verbalizes understanding.

## 2021-10-05 ENCOUNTER — Ambulatory Visit
Admission: RE | Admit: 2021-10-05 | Discharge: 2021-10-05 | Disposition: A | Discharge status: Home / Self Care | Payer: Medicare Other | Source: Ambulatory Visit | Attending: Radiation Oncology | Admitting: Radiation Oncology

## 2021-10-05 DIAGNOSIS — Z5111 Encounter for antineoplastic chemotherapy: Secondary | ICD-10-CM | POA: Diagnosis not present

## 2021-10-05 DIAGNOSIS — Z510 Encounter for antineoplastic radiation therapy: Secondary | ICD-10-CM | POA: Diagnosis not present

## 2021-10-05 DIAGNOSIS — C678 Malignant neoplasm of overlapping sites of bladder: Secondary | ICD-10-CM | POA: Diagnosis not present

## 2021-10-05 DIAGNOSIS — Z79899 Other long term (current) drug therapy: Secondary | ICD-10-CM | POA: Diagnosis not present

## 2021-10-05 DIAGNOSIS — C675 Malignant neoplasm of bladder neck: Secondary | ICD-10-CM | POA: Diagnosis not present

## 2021-10-05 DIAGNOSIS — C670 Malignant neoplasm of trigone of bladder: Secondary | ICD-10-CM | POA: Diagnosis not present

## 2021-10-05 MED FILL — Dexamethasone Sodium Phosphate Inj 100 MG/10ML: INTRAMUSCULAR | Qty: 1 | Status: AC

## 2021-10-05 MED FILL — Fosaprepitant Dimeglumine For IV Infusion 150 MG (Base Eq): INTRAVENOUS | Qty: 5 | Status: AC

## 2021-10-06 ENCOUNTER — Telehealth: Payer: Self-pay | Admitting: Cardiology

## 2021-10-06 ENCOUNTER — Ambulatory Visit
Admission: RE | Admit: 2021-10-06 | Discharge: 2021-10-06 | Disposition: A | Discharge status: Home / Self Care | Payer: Medicare Other | Source: Ambulatory Visit | Attending: Radiation Oncology | Admitting: Radiation Oncology

## 2021-10-06 ENCOUNTER — Inpatient Hospital Stay: Payer: Medicare Other

## 2021-10-06 ENCOUNTER — Other Ambulatory Visit: Payer: Self-pay

## 2021-10-06 VITALS — BP 144/96 | HR 74 | Temp 98.4°F | Resp 16 | Wt 200.5 lb

## 2021-10-06 DIAGNOSIS — Z5111 Encounter for antineoplastic chemotherapy: Secondary | ICD-10-CM | POA: Diagnosis not present

## 2021-10-06 DIAGNOSIS — C678 Malignant neoplasm of overlapping sites of bladder: Secondary | ICD-10-CM | POA: Diagnosis not present

## 2021-10-06 DIAGNOSIS — R42 Dizziness and giddiness: Secondary | ICD-10-CM | POA: Diagnosis not present

## 2021-10-06 DIAGNOSIS — Z79899 Other long term (current) drug therapy: Secondary | ICD-10-CM | POA: Diagnosis not present

## 2021-10-06 DIAGNOSIS — C670 Malignant neoplasm of trigone of bladder: Secondary | ICD-10-CM | POA: Diagnosis not present

## 2021-10-06 DIAGNOSIS — C675 Malignant neoplasm of bladder neck: Secondary | ICD-10-CM | POA: Diagnosis not present

## 2021-10-06 DIAGNOSIS — C679 Malignant neoplasm of bladder, unspecified: Secondary | ICD-10-CM

## 2021-10-06 DIAGNOSIS — R197 Diarrhea, unspecified: Secondary | ICD-10-CM

## 2021-10-06 DIAGNOSIS — Z510 Encounter for antineoplastic radiation therapy: Secondary | ICD-10-CM | POA: Diagnosis not present

## 2021-10-06 LAB — CMP (CANCER CENTER ONLY)
ALT: 18 U/L (ref 0–44)
AST: 22 U/L (ref 15–41)
Albumin: 3.8 g/dL (ref 3.5–5.0)
Alkaline Phosphatase: 41 U/L (ref 38–126)
Anion gap: 9 (ref 5–15)
BUN: 20 mg/dL (ref 8–23)
CO2: 28 mmol/L (ref 22–32)
Calcium: 9.2 mg/dL (ref 8.9–10.3)
Chloride: 105 mmol/L (ref 98–111)
Creatinine: 1.14 mg/dL (ref 0.61–1.24)
GFR, Estimated: >60 mL/min (ref >60)
Glucose, Bld: 84 mg/dL (ref 70–99)
Potassium: 4.1 mmol/L (ref 3.5–5.1)
Sodium: 142 mmol/L (ref 135–145)
Total Bilirubin: 1 mg/dL (ref 0.3–1.2)
Total Protein: 6.5 g/dL (ref 6.5–8.1)

## 2021-10-06 LAB — CBC WITH DIFFERENTIAL (CANCER CENTER ONLY)
Abs Immature Granulocytes: 0.01 10*3/uL (ref 0.00–0.07)
Basophils Absolute: 0 10*3/uL (ref 0.0–0.1)
Basophils Relative: 0 %
Eosinophils Absolute: 0 10*3/uL (ref 0.0–0.5)
Eosinophils Relative: 1 %
HCT: 34.7 % — ABNORMAL LOW (ref 39.0–52.0)
Hemoglobin: 11.9 g/dL — ABNORMAL LOW (ref 13.0–17.0)
Immature Granulocytes: 0 %
Lymphocytes Relative: 17 %
Lymphs Abs: 0.5 10*3/uL — ABNORMAL LOW (ref 0.7–4.0)
MCH: 31.3 pg (ref 26.0–34.0)
MCHC: 34.3 g/dL (ref 30.0–36.0)
MCV: 91.3 fL (ref 80.0–100.0)
Monocytes Absolute: 0.4 10*3/uL (ref 0.1–1.0)
Monocytes Relative: 13 %
Neutro Abs: 1.9 10*3/uL (ref 1.7–7.7)
Neutrophils Relative %: 69 %
Platelet Count: 56 10*3/uL — ABNORMAL LOW (ref 150–400)
RBC: 3.8 MIL/uL — ABNORMAL LOW (ref 4.22–5.81)
RDW: 14.7 % (ref 11.5–15.5)
WBC Count: 2.8 10*3/uL — ABNORMAL LOW (ref 4.0–10.5)
nRBC: 0 % (ref 0.0–0.2)

## 2021-10-06 MED ORDER — SODIUM CHLORIDE 0.9 % IV SOLN: INTRAVENOUS | Status: AC

## 2021-10-06 NOTE — Progress Notes (Signed)
Patient's platelets are 56 today, tx to be held per Dr. Alen Blew.

## 2021-10-06 NOTE — Telephone Encounter (Signed)
Results scanned into epic

## 2021-10-06 NOTE — Telephone Encounter (Signed)
Diamond from [employer] with abnormal zio patch results.

## 2021-10-07 ENCOUNTER — Other Ambulatory Visit: Payer: Self-pay | Admitting: Radiation Oncology

## 2021-10-07 ENCOUNTER — Ambulatory Visit
Admission: RE | Admit: 2021-10-07 | Discharge: 2021-10-07 | Disposition: A | Discharge status: Home / Self Care | Payer: Medicare Other | Source: Ambulatory Visit | Attending: Radiation Oncology | Admitting: Radiation Oncology

## 2021-10-07 DIAGNOSIS — Z510 Encounter for antineoplastic radiation therapy: Secondary | ICD-10-CM | POA: Diagnosis not present

## 2021-10-07 DIAGNOSIS — C678 Malignant neoplasm of overlapping sites of bladder: Secondary | ICD-10-CM | POA: Diagnosis not present

## 2021-10-07 DIAGNOSIS — Z5111 Encounter for antineoplastic chemotherapy: Secondary | ICD-10-CM | POA: Diagnosis not present

## 2021-10-07 DIAGNOSIS — Z79899 Other long term (current) drug therapy: Secondary | ICD-10-CM | POA: Diagnosis not present

## 2021-10-07 DIAGNOSIS — C675 Malignant neoplasm of bladder neck: Secondary | ICD-10-CM | POA: Diagnosis not present

## 2021-10-07 DIAGNOSIS — C670 Malignant neoplasm of trigone of bladder: Secondary | ICD-10-CM | POA: Diagnosis not present

## 2021-10-07 MED ORDER — FUROSEMIDE 20 MG PO TABS: 20.0000 mg | ORAL_TABLET | ORAL | 5 refills | Status: DC

## 2021-10-07 MED ORDER — BACIT-POLY-NEO HC 1 % EX OINT: 1.0000 "application " | TOPICAL_OINTMENT | Freq: Two times a day (BID) | CUTANEOUS | 3 refills | Status: DC

## 2021-10-11 ENCOUNTER — Telehealth: Payer: Self-pay

## 2021-10-11 ENCOUNTER — Telehealth: Payer: Self-pay | Admitting: Cardiology

## 2021-10-11 ENCOUNTER — Other Ambulatory Visit: Payer: Self-pay

## 2021-10-11 ENCOUNTER — Ambulatory Visit
Admission: RE | Admit: 2021-10-11 | Discharge: 2021-10-11 | Disposition: A | Discharge status: Home / Self Care | Payer: Medicare Other | Source: Ambulatory Visit | Attending: Radiation Oncology | Admitting: Radiation Oncology

## 2021-10-11 DIAGNOSIS — Z510 Encounter for antineoplastic radiation therapy: Secondary | ICD-10-CM | POA: Insufficient documentation

## 2021-10-11 DIAGNOSIS — C670 Malignant neoplasm of trigone of bladder: Secondary | ICD-10-CM | POA: Diagnosis not present

## 2021-10-11 DIAGNOSIS — Z5111 Encounter for antineoplastic chemotherapy: Secondary | ICD-10-CM | POA: Insufficient documentation

## 2021-10-11 DIAGNOSIS — C678 Malignant neoplasm of overlapping sites of bladder: Secondary | ICD-10-CM | POA: Insufficient documentation

## 2021-10-11 DIAGNOSIS — Z79899 Other long term (current) drug therapy: Secondary | ICD-10-CM | POA: Insufficient documentation

## 2021-10-11 MED ORDER — FUROSEMIDE 20 MG PO TABS
20.0000 mg | ORAL_TABLET | Freq: Every day | ORAL | Status: DC
Start: 2021-10-11 — End: 2021-10-25

## 2021-10-11 NOTE — Telephone Encounter (Signed)
Pt c/o swelling: STAT is pt has developed SOB within 24 hours  How much weight have you gained and in what time span? 186LBS TO 205LBS  If swelling, where is the swelling located?  BILATERAL LEG/ANKLE/FEET  Are you currently taking a fluid pill? YES  Are you currently SOB? NO  Do you have a log of your daily weights (if so, list)? 086LBS TO 205LBS  Have you gained 3 pounds in a day or 5 pounds in a week?   Have you traveled recently?  NO

## 2021-10-11 NOTE — Telephone Encounter (Signed)
Take the lasix 20mg  daily and update [place] Friday on home weights and swelling. Will review monitor  Zandra Abts MD

## 2021-10-11 NOTE — Telephone Encounter (Signed)
Patient notified and verbalized understanding. 

## 2021-10-11 NOTE — Telephone Encounter (Signed)
Spoken with [name] had complaints of lower bilateral extremity edema.  Observed 2+ edema no SOB, no difficulty with ambulation, or other issues at this time.  Kenneth Caldron, PA, made aware and advise patient to see PCP or cardiologist ASAP.  Patient verbalized understanding and left facility.

## 2021-10-11 NOTE — Telephone Encounter (Signed)
Call placed to patient - states that he has been having swelling x approx 10 day.  States that since he has been on the  chemo & radiation his symptoms seems to have gotten worse.  (Bladder cancer)  Has had to sleep in recliner the last few nights.  States his BP seems to be all over the place as well - ranging 127/89 - 320'Q on the diastolic number.  Notices that when he has the episodes with his heart, seems to be beating harder.  States that Dr. Tammi Klippel did give him rx for Furosemide 20mg  every other day.  Has only taken x 2 doses & does not see any improvement.    Also - please see monitor results on your desktop.

## 2021-10-11 NOTE — Telephone Encounter (Signed)
After speaking with the nurse at the cardiologist they are aware and are awaiting Kenneth [name] call to them to evaluate and possible set appointment if needed.  This nurse called Kenneth [name] to remind him to call cardiologist nurse is awaiting his call to evaluate his symptoms for treatment.  Kenneth [name] verbalized his understanding and stated will make that call.  Appreciative of the assistance.  Nothing else follows.

## 2021-10-12 ENCOUNTER — Ambulatory Visit
Admission: RE | Admit: 2021-10-12 | Discharge: 2021-10-12 | Disposition: A | Discharge status: Home / Self Care | Payer: Medicare Other | Source: Ambulatory Visit | Attending: Radiation Oncology | Admitting: Radiation Oncology

## 2021-10-12 DIAGNOSIS — C678 Malignant neoplasm of overlapping sites of bladder: Secondary | ICD-10-CM | POA: Diagnosis not present

## 2021-10-12 DIAGNOSIS — Z510 Encounter for antineoplastic radiation therapy: Secondary | ICD-10-CM | POA: Diagnosis not present

## 2021-10-12 DIAGNOSIS — Z79899 Other long term (current) drug therapy: Secondary | ICD-10-CM | POA: Diagnosis not present

## 2021-10-12 DIAGNOSIS — C670 Malignant neoplasm of trigone of bladder: Secondary | ICD-10-CM | POA: Diagnosis not present

## 2021-10-12 DIAGNOSIS — Z5111 Encounter for antineoplastic chemotherapy: Secondary | ICD-10-CM | POA: Diagnosis not present

## 2021-10-13 ENCOUNTER — Other Ambulatory Visit: Payer: Self-pay

## 2021-10-13 ENCOUNTER — Ambulatory Visit
Admission: RE | Admit: 2021-10-13 | Discharge: 2021-10-13 | Disposition: A | Discharge status: Home / Self Care | Payer: Medicare Other | Source: Ambulatory Visit | Attending: Radiation Oncology | Admitting: Radiation Oncology

## 2021-10-13 ENCOUNTER — Telehealth: Payer: Self-pay | Admitting: *Deleted

## 2021-10-13 DIAGNOSIS — Z79899 Other long term (current) drug therapy: Secondary | ICD-10-CM | POA: Diagnosis not present

## 2021-10-13 DIAGNOSIS — Z5111 Encounter for antineoplastic chemotherapy: Secondary | ICD-10-CM | POA: Diagnosis not present

## 2021-10-13 DIAGNOSIS — Z510 Encounter for antineoplastic radiation therapy: Secondary | ICD-10-CM | POA: Diagnosis not present

## 2021-10-13 DIAGNOSIS — C678 Malignant neoplasm of overlapping sites of bladder: Secondary | ICD-10-CM | POA: Diagnosis not present

## 2021-10-13 DIAGNOSIS — C670 Malignant neoplasm of trigone of bladder: Secondary | ICD-10-CM | POA: Diagnosis not present

## 2021-10-13 MED ORDER — METOPROLOL TARTRATE 25 MG PO TABS: 25.0000 mg | ORAL_TABLET | Freq: Two times a day (BID) | ORAL | 6 refills | Status: DC

## 2021-10-13 MED FILL — Dexamethasone Sodium Phosphate Inj 100 MG/10ML: INTRAMUSCULAR | Qty: 1 | Status: AC

## 2021-10-13 MED FILL — Fosaprepitant Dimeglumine For IV Infusion 150 MG (Base Eq): INTRAVENOUS | Qty: 5 | Status: AC

## 2021-10-13 NOTE — Telephone Encounter (Signed)
-----   Message from Arnoldo Lenis, MD sent at 10/11/2021  4:11 PM EST ----- Heart monitor shows afib with very fast heart rates at times likely the cause of his symptoms. Can he start lopressor 25mg  bid and update [place] on his symptoms in 1 week. The fast rates may also contribue to some of the swelling he has had, when heart beats fast has a hard time moving all the fluid through the body like it should  Zandra Abts MD

## 2021-10-13 NOTE — Telephone Encounter (Signed)
Laurine Blazer, LPN  9/0/6893  4:06 PM EST Back to Top    Notified, copy to pcp.  New medication sent to [facility] now

## 2021-10-14 ENCOUNTER — Ambulatory Visit
Admission: RE | Admit: 2021-10-14 | Discharge: 2021-10-14 | Disposition: A | Discharge status: Home / Self Care | Payer: Medicare Other | Source: Ambulatory Visit | Attending: Radiation Oncology | Admitting: Radiation Oncology

## 2021-10-14 ENCOUNTER — Inpatient Hospital Stay: Payer: Medicare Other

## 2021-10-14 ENCOUNTER — Inpatient Hospital Stay (HOSPITAL_BASED_OUTPATIENT_CLINIC_OR_DEPARTMENT_OTHER): Payer: Medicare Other | Admitting: Oncology

## 2021-10-14 VITALS — BP 101/70 | HR 89 | Temp 97.2°F | Resp 18 | Ht 72.0 in | Wt 199.6 lb

## 2021-10-14 DIAGNOSIS — Z79899 Other long term (current) drug therapy: Secondary | ICD-10-CM | POA: Insufficient documentation

## 2021-10-14 DIAGNOSIS — Z923 Personal history of irradiation: Secondary | ICD-10-CM | POA: Insufficient documentation

## 2021-10-14 DIAGNOSIS — C678 Malignant neoplasm of overlapping sites of bladder: Secondary | ICD-10-CM | POA: Diagnosis not present

## 2021-10-14 DIAGNOSIS — Z5111 Encounter for antineoplastic chemotherapy: Secondary | ICD-10-CM | POA: Diagnosis not present

## 2021-10-14 DIAGNOSIS — C679 Malignant neoplasm of bladder, unspecified: Secondary | ICD-10-CM | POA: Diagnosis not present

## 2021-10-14 DIAGNOSIS — C675 Malignant neoplasm of bladder neck: Secondary | ICD-10-CM | POA: Insufficient documentation

## 2021-10-14 DIAGNOSIS — Z510 Encounter for antineoplastic radiation therapy: Secondary | ICD-10-CM | POA: Diagnosis not present

## 2021-10-14 DIAGNOSIS — C670 Malignant neoplasm of trigone of bladder: Secondary | ICD-10-CM | POA: Diagnosis not present

## 2021-10-14 LAB — CMP (CANCER CENTER ONLY)
ALT: 13 U/L (ref 0–44)
AST: 21 U/L (ref 15–41)
Albumin: 4.1 g/dL (ref 3.5–5.0)
Alkaline Phosphatase: 54 U/L (ref 38–126)
Anion gap: 13 (ref 5–15)
BUN: 17 mg/dL (ref 8–23)
CO2: 24 mmol/L (ref 22–32)
Calcium: 8.8 mg/dL — ABNORMAL LOW (ref 8.9–10.3)
Chloride: 103 mmol/L (ref 98–111)
Creatinine: 1.1 mg/dL (ref 0.61–1.24)
GFR, Estimated: >60 mL/min (ref >60)
Glucose, Bld: 76 mg/dL (ref 70–99)
Potassium: 3.8 mmol/L (ref 3.5–5.1)
Sodium: 140 mmol/L (ref 135–145)
Total Bilirubin: 1.2 mg/dL (ref 0.3–1.2)
Total Protein: 7.1 g/dL (ref 6.5–8.1)

## 2021-10-14 LAB — MAGNESIUM: Magnesium: 1.5 mg/dL — ABNORMAL LOW (ref 1.7–2.4)

## 2021-10-14 LAB — CBC WITH DIFFERENTIAL (CANCER CENTER ONLY)
Abs Immature Granulocytes: 0.01 10*3/uL (ref 0.00–0.07)
Basophils Absolute: 0 10*3/uL (ref 0.0–0.1)
Basophils Relative: 1 %
Eosinophils Absolute: 0 10*3/uL (ref 0.0–0.5)
Eosinophils Relative: 2 %
HCT: 36.7 % — ABNORMAL LOW (ref 39.0–52.0)
Hemoglobin: 11.9 g/dL — ABNORMAL LOW (ref 13.0–17.0)
Immature Granulocytes: 1 %
Lymphocytes Relative: 21 %
Lymphs Abs: 0.4 10*3/uL — ABNORMAL LOW (ref 0.7–4.0)
MCH: 31 pg (ref 26.0–34.0)
MCHC: 32.4 g/dL (ref 30.0–36.0)
MCV: 95.6 fL (ref 80.0–100.0)
Monocytes Absolute: 0.3 10*3/uL (ref 0.1–1.0)
Monocytes Relative: 14 %
Neutro Abs: 1.3 10*3/uL — ABNORMAL LOW (ref 1.7–7.7)
Neutrophils Relative %: 61 %
Platelet Count: 98 10*3/uL — ABNORMAL LOW (ref 150–400)
RBC: 3.84 MIL/uL — ABNORMAL LOW (ref 4.22–5.81)
RDW: 17.6 % — ABNORMAL HIGH (ref 11.5–15.5)
WBC Count: 2.1 10*3/uL — ABNORMAL LOW (ref 4.0–10.5)
nRBC: 1.4 % — ABNORMAL HIGH (ref 0.0–0.2)

## 2021-10-14 NOTE — Progress Notes (Signed)
Hematology and Oncology Follow Up Visit  Kenneth [name] [id number] 14-Apr-1951 71 y.o. 10/14/2021 8:30 AM Kenneth [name], MDVyas, Kenneth Hatcher, MD   Principle Diagnosis: 71 year old with T2N0 high-grade urothelial carcinoma of the bladder diagnosed in September 2022.    Prior Therapy: He is status post TURBT on June 17, 2021. 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    Current therapy: Definitive therapy with radiation and weekly cisplatin.  He is here for cycle 5 of therapy.  Interim History: [name] returns today for a follow-up visit.  Since last visit, he continues to receive definitive therapy with radiation and weekly cisplatin.  Chemotherapy was withheld on October 06, 2021 due to cytopenia.  Clinically, he reports a few complaints including fatigue, tiredness and dyspnea on exertion.  He has reported increased lower extremity edema.  He has been diagnosed with atrial fibrillation continues to follow with cardiology regarding this issue.     Medications: Reviewed and updated. Current Outpatient Medications  Medication Sig Dispense Refill   acetaminophen (TYLENOL) 500 MG tablet Take 500-1,000 mg by mouth every 6 (six) hours as needed for moderate pain.     alendronate (FOSAMAX) 70 MG tablet Take 70 mg by mouth once a week. Takes on Thursday's ;  Take with a full glass of water on an empty stomach.     apixaban (ELIQUIS) 5 MG TABS tablet Take 1 tablet (5 mg total) by mouth 2 (two) times daily. 60 tablet 0   Ascorbic Acid (VITAMIN C) 1000 MG tablet Take 1,000 mg by mouth 2 (two) times daily.     bacitracin-neomycin-polymyxin-hydrocortisone (CORTISPORIN) 1 % ointment Apply 1 application topically 2 (two) times daily. 15 g 3   calcium-vitamin D (OSCAL WITH D) 500-200 MG-UNIT tablet Take 1 tablet by mouth 2 (two) times daily.      furosemide (LASIX) 20 MG tablet Take 1 tablet (20 mg total) by mouth daily.     loperamide (IMODIUM A-D) 2 MG tablet Take 2 mg by mouth 4 (four) times daily as needed for diarrhea or loose stools.     Magnesium 250 MG TABS Take 250 mg by mouth daily.     metoprolol tartrate (LOPRESSOR) 25 MG tablet Take 1 tablet (25 mg total) by mouth 2 (two) times daily. 60 tablet 6   Multiple Vitamins-Minerals (MULTIVITAMIN PO) Take 1 tablet by mouth daily.     omeprazole (PRILOSEC) 20 MG capsule Take 20 mg by mouth daily.     Polyethyl Glycol-Propyl Glycol (SYSTANE OP) Place 1 drop into both eyes daily as needed (dry eyes).     prochlorperazine (COMPAZINE) 10 MG tablet Take 1 tablet (10 mg total) by mouth every 6 (six) hours as needed for nausea or vomiting. 30 tablet 0   prochlorperazine (COMPAZINE) 10 MG tablet Take 1 tablet (10 mg total) by mouth every 6 (six) hours as needed for nausea or vomiting. 30 tablet 0   pseudoephedrine (SUDAFED) 30 MG tablet Take 30 mg by mouth daily as needed for congestion.     sildenafil (REVATIO) 20 MG tablet Take 20-60 mg by mouth daily as needed (erectile dysfunction).     tiZANidine (ZANAFLEX) 4 MG capsule Take 4 mg by mouth 2 (two) times daily as needed for muscle spasms.     No current facility-administered medications for this visit.     Allergies: No Known  Allergies    Physical Exam:  Blood pressure 101/70, pulse 89, temperature (!) 97.2 F (36.2 C), temperature source Temporal, resp. rate 18, height 6' (1.829 m), weight 199 lb 9.6 oz (90.5 kg), SpO2 98 %.   ECOG: 1   General appearance: Alert, awake without any distress. Head: Atraumatic without abnormalities Oropharynx: Without any thrush or ulcers. Eyes: No scleral icterus. Lymph nodes: No lymphadenopathy noted in the cervical, supraclavicular, or axillary nodes Heart: irregular rhythm with controlled rate.  Bilateral edema noted at the ankle. Lung: Clear to auscultation without any rhonchi, wheezes  or dullness to percussion. Abdomin: Soft, nontender without any shifting dullness or ascites. Musculoskeletal: No clubbing or cyanosis. Neurological: No motor or sensory deficits. Skin: No rashes or lesions.       Lab Results: Lab Results  Component Value Date   WBC 2.8 (L) 10/06/2021   HGB 11.9 (L) 10/06/2021   HCT 34.7 (L) 10/06/2021   MCV 91.3 10/06/2021   PLT 56 (L) 10/06/2021     Chemistry      Component Value Date/Time   NA 142 10/06/2021 0826   K 4.1 10/06/2021 0826   CL 105 10/06/2021 0826   CO2 28 10/06/2021 0826   BUN 20 10/06/2021 0826   CREATININE 1.14 10/06/2021 0826   CREATININE 0.78 07/19/2021 1019      Component Value Date/Time   CALCIUM 9.2 10/06/2021 0826   ALKPHOS 41 10/06/2021 0826   AST 22 10/06/2021 0826   ALT 18 10/06/2021 0826   BILITOT 1.0 10/06/2021 0826         Impression and Plan:  71 year old with:  1.  T2N0 high-grade urothelial carcinoma of the bladder diagnosed in September 2022.   Risks and benefits of continuing current therapy were reviewed at this time.  Potential complications that include nausea, vomiting, myelosuppression among others were reiterated.  The plan is to complete definitive treatment in the next few weeks and repeat staging scans in the future.  After discussion today, we opted to discontinue chemotherapy at this time and he will finish his radiation course.     2.  IV access: Peripheral veins continue to be in use without any issue.   3.  Antiemetics: No nausea or vomiting reported at this time.  Compazine is available to him.   4.  Renal function surveillance: His kidney function remains normal on platinum therapy.   5.  Myelosuppression and cytopenia: He developed neutropenia and thrombocytopenia.  No active bleeding or infection noted.  This is related to cisplatin and anticipate recovery upon completing therapy.  Laboratory data reviewed today and showed near platelet recovery although he still has  element of neutropenia.  No additional growth factor support or intervention is needed.   6.  Follow-up: He will return in 2 weeks for repeat follow-up.   30  minutes were spent on this encounter.  The time was dedicated to reviewing laboratory data, disease status update and outlining future plan of care discussion.   Zola Button, MD 1/6/20238:30 AM

## 2021-10-17 ENCOUNTER — Ambulatory Visit
Admission: RE | Admit: 2021-10-17 | Discharge: 2021-10-17 | Disposition: A | Discharge status: Home / Self Care | Payer: Medicare Other | Source: Ambulatory Visit | Attending: Radiation Oncology | Admitting: Radiation Oncology

## 2021-10-17 ENCOUNTER — Other Ambulatory Visit: Payer: Self-pay

## 2021-10-17 DIAGNOSIS — C670 Malignant neoplasm of trigone of bladder: Secondary | ICD-10-CM | POA: Diagnosis not present

## 2021-10-17 DIAGNOSIS — Z5111 Encounter for antineoplastic chemotherapy: Secondary | ICD-10-CM | POA: Diagnosis not present

## 2021-10-17 DIAGNOSIS — Z510 Encounter for antineoplastic radiation therapy: Secondary | ICD-10-CM | POA: Diagnosis not present

## 2021-10-17 DIAGNOSIS — Z79899 Other long term (current) drug therapy: Secondary | ICD-10-CM | POA: Diagnosis not present

## 2021-10-17 DIAGNOSIS — C678 Malignant neoplasm of overlapping sites of bladder: Secondary | ICD-10-CM | POA: Diagnosis not present

## 2021-10-18 ENCOUNTER — Ambulatory Visit
Admission: RE | Admit: 2021-10-18 | Discharge: 2021-10-18 | Disposition: A | Discharge status: Home / Self Care | Payer: Medicare Other | Source: Ambulatory Visit | Attending: Radiation Oncology | Admitting: Radiation Oncology

## 2021-10-18 ENCOUNTER — Telehealth: Payer: Self-pay | Admitting: Cardiology

## 2021-10-18 DIAGNOSIS — Z510 Encounter for antineoplastic radiation therapy: Secondary | ICD-10-CM | POA: Diagnosis not present

## 2021-10-18 DIAGNOSIS — C670 Malignant neoplasm of trigone of bladder: Secondary | ICD-10-CM | POA: Diagnosis not present

## 2021-10-18 DIAGNOSIS — Z79899 Other long term (current) drug therapy: Secondary | ICD-10-CM | POA: Diagnosis not present

## 2021-10-18 DIAGNOSIS — Z5111 Encounter for antineoplastic chemotherapy: Secondary | ICD-10-CM | POA: Diagnosis not present

## 2021-10-18 DIAGNOSIS — C678 Malignant neoplasm of overlapping sites of bladder: Secondary | ICD-10-CM | POA: Diagnosis not present

## 2021-10-18 NOTE — Telephone Encounter (Signed)
There is a 11am that has opened  Zandra Abts MD

## 2021-10-18 NOTE — Telephone Encounter (Signed)
Has cancer treatment in the morning at 10:45 am and is unable to come for morning appointments until after 10/27/2021. No other available appointments found.

## 2021-10-18 NOTE — Telephone Encounter (Signed)
Pt sent message through my chart requesting an apt   Message from pt:  I just want toreport my current condition. My legs ankles and feet are still swollen..Taking fluid pill every day.And added the heart pill twice day that you prescribed. Oncologist has stopped chemo completely.Tuesday,my weight at 9:20pm was197.4 .[name] KitchenBP 119/88 pulse 83 .[name] KitchenWed at 7 pm weight 196.8 BP 120/88.[name] KitchenThurs.198 Lbs.Bp108/81 pulse 100..Fri.8 pm196 Lbs..BP 116/87 pulse 94.Sun.8pm 197.4 bp137/110 pulse 80  Please contact pt @ [phone]

## 2021-10-19 ENCOUNTER — Ambulatory Visit
Admission: RE | Admit: 2021-10-19 | Discharge: 2021-10-19 | Disposition: A | Discharge status: Home / Self Care | Payer: Medicare Other | Source: Ambulatory Visit | Attending: Radiation Oncology | Admitting: Radiation Oncology

## 2021-10-19 ENCOUNTER — Other Ambulatory Visit: Payer: Self-pay

## 2021-10-19 DIAGNOSIS — Z510 Encounter for antineoplastic radiation therapy: Secondary | ICD-10-CM | POA: Diagnosis not present

## 2021-10-19 DIAGNOSIS — C670 Malignant neoplasm of trigone of bladder: Secondary | ICD-10-CM | POA: Diagnosis not present

## 2021-10-19 DIAGNOSIS — Z5111 Encounter for antineoplastic chemotherapy: Secondary | ICD-10-CM | POA: Diagnosis not present

## 2021-10-19 DIAGNOSIS — C678 Malignant neoplasm of overlapping sites of bladder: Secondary | ICD-10-CM | POA: Diagnosis not present

## 2021-10-19 DIAGNOSIS — Z79899 Other long term (current) drug therapy: Secondary | ICD-10-CM | POA: Diagnosis not present

## 2021-10-19 NOTE — Telephone Encounter (Signed)
Spike is returning Kenneth [name]'s call to schedule this appt. Dr. Harl Bowie does not have a schedule for 10/25/21, so I was unable to schedule.

## 2021-10-19 NOTE — Telephone Encounter (Signed)
Patient informed and verbalized understanding of plan. 

## 2021-10-19 NOTE — Telephone Encounter (Signed)
Could do Tuesday Jan 17 in [place] at Carlynn Purl MD

## 2021-10-20 ENCOUNTER — Ambulatory Visit
Admission: RE | Admit: 2021-10-20 | Discharge: 2021-10-20 | Disposition: A | Discharge status: Home / Self Care | Payer: Medicare Other | Source: Ambulatory Visit | Attending: Radiation Oncology | Admitting: Radiation Oncology

## 2021-10-20 DIAGNOSIS — Z510 Encounter for antineoplastic radiation therapy: Secondary | ICD-10-CM | POA: Diagnosis not present

## 2021-10-20 DIAGNOSIS — Z5111 Encounter for antineoplastic chemotherapy: Secondary | ICD-10-CM | POA: Diagnosis not present

## 2021-10-20 DIAGNOSIS — C670 Malignant neoplasm of trigone of bladder: Secondary | ICD-10-CM | POA: Diagnosis not present

## 2021-10-20 DIAGNOSIS — C678 Malignant neoplasm of overlapping sites of bladder: Secondary | ICD-10-CM | POA: Diagnosis not present

## 2021-10-20 DIAGNOSIS — Z79899 Other long term (current) drug therapy: Secondary | ICD-10-CM | POA: Diagnosis not present

## 2021-10-21 ENCOUNTER — Ambulatory Visit
Admission: RE | Admit: 2021-10-21 | Discharge: 2021-10-21 | Disposition: A | Discharge status: Home / Self Care | Payer: Medicare Other | Source: Ambulatory Visit | Attending: Radiation Oncology | Admitting: Radiation Oncology

## 2021-10-21 ENCOUNTER — Other Ambulatory Visit: Payer: Self-pay

## 2021-10-21 DIAGNOSIS — C678 Malignant neoplasm of overlapping sites of bladder: Secondary | ICD-10-CM | POA: Diagnosis not present

## 2021-10-21 DIAGNOSIS — Z5111 Encounter for antineoplastic chemotherapy: Secondary | ICD-10-CM | POA: Diagnosis not present

## 2021-10-21 DIAGNOSIS — Z510 Encounter for antineoplastic radiation therapy: Secondary | ICD-10-CM | POA: Diagnosis not present

## 2021-10-21 DIAGNOSIS — C670 Malignant neoplasm of trigone of bladder: Secondary | ICD-10-CM | POA: Diagnosis not present

## 2021-10-21 DIAGNOSIS — Z79899 Other long term (current) drug therapy: Secondary | ICD-10-CM | POA: Diagnosis not present

## 2021-10-22 ENCOUNTER — Encounter: Payer: Self-pay | Admitting: Urology

## 2021-10-22 DIAGNOSIS — C670 Malignant neoplasm of trigone of bladder: Secondary | ICD-10-CM

## 2021-10-24 ENCOUNTER — Ambulatory Visit
Admission: RE | Admit: 2021-10-24 | Discharge: 2021-10-24 | Disposition: A | Discharge status: Home / Self Care | Payer: Medicare Other | Source: Ambulatory Visit | Attending: Radiation Oncology | Admitting: Radiation Oncology

## 2021-10-24 ENCOUNTER — Other Ambulatory Visit: Payer: Self-pay

## 2021-10-24 DIAGNOSIS — Z79899 Other long term (current) drug therapy: Secondary | ICD-10-CM | POA: Diagnosis not present

## 2021-10-24 DIAGNOSIS — Z5111 Encounter for antineoplastic chemotherapy: Secondary | ICD-10-CM | POA: Diagnosis not present

## 2021-10-24 DIAGNOSIS — C670 Malignant neoplasm of trigone of bladder: Secondary | ICD-10-CM | POA: Diagnosis not present

## 2021-10-24 DIAGNOSIS — Z510 Encounter for antineoplastic radiation therapy: Secondary | ICD-10-CM | POA: Diagnosis not present

## 2021-10-24 DIAGNOSIS — C678 Malignant neoplasm of overlapping sites of bladder: Secondary | ICD-10-CM | POA: Diagnosis not present

## 2021-10-25 ENCOUNTER — Ambulatory Visit (INDEPENDENT_AMBULATORY_CARE_PROVIDER_SITE_OTHER): Payer: Medicare Other | Admitting: Cardiology

## 2021-10-25 ENCOUNTER — Encounter: Payer: Self-pay | Admitting: Cardiology

## 2021-10-25 ENCOUNTER — Ambulatory Visit
Admission: RE | Admit: 2021-10-25 | Discharge: 2021-10-25 | Disposition: A | Discharge status: Home / Self Care | Payer: Medicare Other | Source: Ambulatory Visit | Attending: Radiation Oncology | Admitting: Radiation Oncology

## 2021-10-25 VITALS — BP 116/84 | HR 71 | Ht 72.0 in | Wt 199.0 lb

## 2021-10-25 DIAGNOSIS — I5033 Acute on chronic diastolic (congestive) heart failure: Secondary | ICD-10-CM | POA: Diagnosis not present

## 2021-10-25 DIAGNOSIS — C678 Malignant neoplasm of overlapping sites of bladder: Secondary | ICD-10-CM | POA: Diagnosis not present

## 2021-10-25 DIAGNOSIS — Z5111 Encounter for antineoplastic chemotherapy: Secondary | ICD-10-CM | POA: Diagnosis not present

## 2021-10-25 DIAGNOSIS — C670 Malignant neoplasm of trigone of bladder: Secondary | ICD-10-CM | POA: Diagnosis not present

## 2021-10-25 DIAGNOSIS — Z79899 Other long term (current) drug therapy: Secondary | ICD-10-CM | POA: Diagnosis not present

## 2021-10-25 DIAGNOSIS — I4891 Unspecified atrial fibrillation: Secondary | ICD-10-CM

## 2021-10-25 DIAGNOSIS — Z510 Encounter for antineoplastic radiation therapy: Secondary | ICD-10-CM | POA: Diagnosis not present

## 2021-10-25 MED ORDER — FUROSEMIDE 40 MG PO TABS: 40.0000 mg | ORAL_TABLET | Freq: Every day | ORAL | 3 refills | Status: DC

## 2021-10-25 MED ORDER — METOPROLOL TARTRATE 25 MG PO TABS: 37.5000 mg | ORAL_TABLET | Freq: Two times a day (BID) | ORAL | 3 refills | Status: DC

## 2021-10-25 NOTE — Progress Notes (Signed)
Clinical Summary Mr. Dearinger is a 71 y.o.male seen today for follow up of the following medical problems.    1. Afib - has been off eliquis since August due to hematuria related to his bladder cancer - retried eliquis about 2 weeks ago, recurrent hematuria. Last TURBT 08/16/2021   09/2021 monitor: 100% afib, Patient was in afib throughout the study, range 64-213 bpm, average HR 110 - after monitor we started lopressor 25mg  bid.  - ongong palpitations.   - remains off eliquis due to prior bleeding, pancytopenia w chemo  - no recent hematuria. Intermittent thrombocytopenia.   2. Acute on chronic diastolic HF 02/2021 echo LVEF 55-60%, no WMAs, indet diastolic fxn, mild to mod MR, mild to mod TR -recent edema. Started lasix 20mg  daily,   - weight 05/2021 192 lbs, today 199 lbs.  - +orthopnea   2. Valvular heart diasease - 11/2017 echo LVEF 50-55%, mod MR, mild to mod TR.  02/2021 echo LVEF 55-60%, no WMAs, indet diastolic fxn, mild to mod MR, mild to mod TR   3.Bladder cancer - followed by oncology and urology - undergoing chemo and radiation, along with urology sTURBT  Oct 14 2021 onc note "he continues to receive definitive therapy with radiation and weekly cisplatin.  Chemotherapy was withheld on October 06, 2021 due to cytopenia"    AAA screen Male over 96 with tobacco history. 09/2011 abdominal 75 without aneusyrm.    Past Medical History:  Diagnosis Date   Anticoagulant long-term use    eliquis--- managed by cardiology   Arthritis    osteoporosis   Atrial fibrillation, unspecified type [facility]) 2018   cardiologist--- dr j. Juma Oxley   Bladder neoplasm    Bradycardia    COPD (chronic obstructive pulmonary disease) (HCC)    Gallbladder polyp    followed by surgeon, dr r. cathy (lov note in epic 06-27-2021 stated surgical management after pt has completed bladder cancer treatment   GERD (gastroesophageal reflux disease)    Heart murmur    History of drug abuse in remission  (HCC)    per pt in remission since 02-26-1999   History of hepatitis C 12/2020   followed by dr d. 02-28-1999 (aph/ [employer] and hepatology);  dx 03/ 2022, liver bx 01-12-2021 g2hepatitis, f2;  completed 12 wks treatment 04-14-2021 w/ epclusa, hcv undetectable 04-21-2021   Mitral valve regurgitation    followed by dr Marria Mathison---  last echo in epic 02-10-2021  moderate MR without stenosis   Osteoporosis    Wears dentures    full upper and lower partial   Wears hearing aid in left ear      No Known Allergies   Current Outpatient Medications  Medication Sig Dispense Refill   acetaminophen (TYLENOL) 500 MG tablet Take 500-1,000 mg by mouth every 6 (six) hours as needed for moderate pain.     alendronate (FOSAMAX) 70 MG tablet Take 70 mg by mouth once a week. Takes on Thursday's ;  Take with a full glass of water on an empty stomach.     apixaban (ELIQUIS) 5 MG TABS tablet Take 1 tablet (5 mg total) by mouth 2 (two) times daily. (Patient not taking: Reported on 10/14/2021) 60 tablet 0   Ascorbic Acid (VITAMIN C) 1000 MG tablet Take 1,000 mg by mouth 2 (two) times daily.     bacitracin-neomycin-polymyxin-hydrocortisone (CORTISPORIN) 1 % ointment Apply 1 application topically 2 (two) times daily. 15 g 3   calcium-vitamin D (OSCAL WITH D)  500-200 MG-UNIT tablet Take 1 tablet by mouth 2 (two) times daily.     furosemide (LASIX) 20 MG tablet Take 1 tablet (20 mg total) by mouth daily.     loperamide (IMODIUM A-D) 2 MG tablet Take 2 mg by mouth 4 (four) times daily as needed for diarrhea or loose stools.     Magnesium 250 MG TABS Take 250 mg by mouth daily.     metoprolol tartrate (LOPRESSOR) 25 MG tablet Take 1 tablet (25 mg total) by mouth 2 (two) times daily. 60 tablet 6   Multiple Vitamins-Minerals (MULTIVITAMIN PO) Take 1 tablet by mouth daily.     omeprazole (PRILOSEC) 20 MG capsule Take 20 mg by mouth daily.     Polyethyl Glycol-Propyl Glycol (SYSTANE OP) Place 1 drop into both eyes  daily as needed (dry eyes). (Patient not taking: Reported on 10/14/2021)     prochlorperazine (COMPAZINE) 10 MG tablet Take 1 tablet (10 mg total) by mouth every 6 (six) hours as needed for nausea or vomiting. 30 tablet 0   prochlorperazine (COMPAZINE) 10 MG tablet Take 1 tablet (10 mg total) by mouth every 6 (six) hours as needed for nausea or vomiting. 30 tablet 0   pseudoephedrine (SUDAFED) 30 MG tablet Take 30 mg by mouth daily as needed for congestion.     sildenafil (REVATIO) 20 MG tablet Take 20-60 mg by mouth daily as needed (erectile dysfunction).     tiZANidine (ZANAFLEX) 4 MG capsule Take 4 mg by mouth 2 (two) times daily as needed for muscle spasms.     No current facility-administered medications for this visit.     Past Surgical History:  Procedure Laterality Date   APPENDECTOMY     age 44   BROW LIFT AND BLEPHAROPLASTY Bilateral    APPROX. 2017   CATARACT EXTRACTION W/ INTRAOCULAR LENS IMPLANT Bilateral 2018   HEMORROIDECTOMY     2010 @[facility]  and 2018 @ [facility]   HYDROCELE EXCISION Right 08/13/2017   Procedure: RIGHT HYDROCELECTOMY;  Surgeon: 13/02/2017, MD;  Location: [facility];  Service: Urology;  Laterality: Right;   HYDROCELE EXCISION Right 11/12/2017   Procedure: RIGHT HYDROCELECTOMY;  Surgeon: 01/10/2018, MD;  Location: [facility];  Service: Urology;  Laterality: Right;   INGUINAL HERNIA REPAIR Bilateral    APPROX.   2010;   WITH UMBILICAL HERNIA AND EPIGRASTIC HERNIA REPAIRS   INGUINAL HERNIA REPAIR  09/11/2011   Procedure: HERNIA REPAIR INGUINAL ADULT;  Surgeon: 14/12/2010;  Location: [facility];  Service: General;  Laterality: Right;  Recurrent Right Inguinal Hernia Repair   MANDIBLE RECONSTRUCTION     x2  last one 1980;   from [facility]va  ( has retained hardware)   MIDDLE EAR SURGERY Right 02/19/2015   by Dr 02/21/2015 ;  tympanomastoidectomy   ROTATOR CUFF REPAIR Left 03/31/2016   TRANSURETHRAL RESECTION OF BLADDER TUMOR N/A 06/17/2021   Procedure:  TRANSURETHRAL RESECTION OF BLADDER TUMOR (TURBT) WITH POST OPERATIVE INSTILLATION OF GEMCITABINE;  Surgeon: 08/17/2021, MD;  Location[facility];  Service: Urology;  Laterality: N/A;   TRANSURETHRAL RESECTION OF BLADDER TUMOR N/A 08/16/2021   Procedure: TRANSURETHRAL RESECTION OF BLADDER TUMOR (TURBT);  Surgeon: 13/05/2021, MD;  Location: [facility];  Service: Urology;  Laterality: N/A;   TYMPANOPLASTY Right 05/02/2016   Procedure: TYMPANOPLASTY;  Surgeon: 05/04/2016, MD;  Location: [facility];  Service: ENT;  Laterality: Right;   UMBILICAL HERNIA REPAIR  2017  No Known Allergies    Family History  Problem Relation Age of Onset   Aortic aneurysm Other        Deceased   Cancer Other        Deceased   Cardiomyopathy Other        Alive   Anesthesia problems Neg Hx    Hypotension Neg Hx    Malignant hyperthermia Neg Hx    Pseudochol deficiency Neg Hx      Social History Mr. Seider reports that he quit smoking about 14 years ago. His smoking use included cigarettes. He has a 21.00 pack-year smoking history. He quit smokeless tobacco use about 15 years ago. Mr. Hemp reports that he does not currently use alcohol.   Review of Systems CONSTITUTIONAL: No weight loss, fever, chills, weakness or fatigue.  HEENT: Eyes: No visual loss, blurred vision, double vision or yellow sclerae.No hearing loss, sneezing, congestion, runny nose or sore throat.  SKIN: No rash or itching.  CARDIOVASCULAR: per hpi RESPIRATORY: per hpi GASTROINTESTINAL: No anorexia, nausea, vomiting or diarrhea. No abdominal pain or blood.  GENITOURINARY: No burning on urination, no polyuria NEUROLOGICAL: No headache, dizziness, syncope, paralysis, ataxia, numbness or tingling in the extremities. No change in bowel or bladder control.  MUSCULOSKELETAL: No muscle, back pain, joint pain or stiffness.  LYMPHATICS: No enlarged nodes. No history of splenectomy.   PSYCHIATRIC: No history of depression or anxiety.  ENDOCRINOLOGIC: No reports of sweating, cold or heat intolerance. No polyuria or polydipsia.  [name] Kitchen   Physical Examination Today's Vitals   10/25/21 1256  BP: 116/84  Pulse: 71  SpO2: 97%  Weight: 199 lb (90.3 kg)  Height: 6' (1.829 m)   Body mass index is 26.99 kg/m.  Gen: resting comfortably, no acute distress HEENT: no scleral icterus, pupils equal round and reactive, no palptable cervical adenopathy,  CV: irreg Resp: faint crackles bases GI: abdomen is soft, non-tender, non-distended, normal bowel sounds, no hepatosplenomegaly MSK: extremities are warm,2+ bilateral LE edema Skin: warm, no rash Neuro:  no focal deficits Psych: appropriate affect   Diagnostic Studies  11/2017 echo Study Conclusions   - Left ventricle: The cavity size was mildly dilated. Wall   thickness was increased in a pattern of mild LVH. Systolic   function was normal. The estimated ejection fraction was in the   range of 50% to 55%. Although no diagnostic regional wall motion   abnormality was identified, this possibility cannot be completely   excluded on the basis of this study. The study was not   technically sufficient to allow evaluation of LV diastolic   dysfunction due to atrial fibrillation. - Aortic valve: Moderately calcified annulus. Trileaflet. - Mitral valve: Mildly thickened leaflets . Systolic bowing without   prolapse. There was moderate regurgitation. - Left atrium: The atrium was mildly dilated. - Right ventricle: The cavity size was mildly dilated. - Right atrium: The atrium was moderately dilated. Central venous   pressure (est): 8 mm Hg. - Atrial septum: No defect or patent foramen ovale was identified. - Tricuspid valve: There was mild-moderate regurgitation. - Pulmonary arteries: PA peak pressure: 29 mm Hg (S). - Pericardium, extracardiac: A small pericardial effusion was   identified anterior to the  heart.    09/2021 Zio patch 14 day monitor Patient was in afib throughout the study, range 64-213 bpm, average HR 110 Rare ventricular ectopy in the form of isolated PVCs, couplets, triplets. Rare short episodes of wide complex tachycardia that appear more consistent with SVT  with aberrancy as opposed to NSVT No symptoms reported Overall 100% afib burden with very high heart rates at times.     Patch Wear Time:  14 days and 0 hours (2022-12-06T07:52:31-0500 to 2022-12-20T07:52:35-0500)   7 Ventricular Tachycardia runs occurred, the run with the fastest interval lasting 4 beats with a max rate of 197 bpm, the longest lasting 11 beats with an avg rate of 145 bpm. Atrial Fibrillation occurred continuously (100% burden), ranging from 64-213  bpm (avg of 110 bpm). Intermittent Bundle Ladainian Therien Block was present. Isolated VEs were rare (<1.0%, 14535), VE Couplets were rare (<1.0%, 1056), and VE Triplets were rare (<1.0%, 13). Ventricular Bigeminy was present. MD notification criteria for Rapid  Atrial Fibrillation met - report posted prior to notification per account request (ES).    Assessment and Plan   1. Afib -recent monitor showed afib 100% burden with high rates, started on lopressor - dynamap HRs are inaccurate for him. Today 71 by dynamap, 120 by EKG. Will need EKGs at f/u visit to assess rate, increase lopressor to 37.$RemoveBeforeD'5mg'kJBjqUfQIqcweJ$  bid. Not able to cardiovert since not on anticoag. Retry anticoag in near future pending cbc.  - off eliquis recently due to hematuria, intermittent pancytopenia with chemo   2. Acute on chronic diastolic heart failure - significant LE edema that is new for him, not improved with lasix $RemoveBe'20mg'cOvXLKwlK$  daily - increase lasix to $Remove'40mg'dfJCLXs$  daily. Check bmet/mg/bnp in 2 weeks - he will call Friday to update [place] on swelling and home weights - concern about tachy mediated CM given ongoign afib with high rates and new edema and SOB/DOE. Recheck echo.      Arnoldo Lenis, M.D.

## 2021-10-25 NOTE — Patient Instructions (Addendum)
Medication Instructions:  Your physician has recommended you make the following change in your medication:  INCREASE Lasix to 40 mg tablets daily   INCREASE Lopressor to 37.5 mg (1 1/2 tablets) twice a day  *If you need a refill on your cardiac medications before your next appointment, please call your pharmacy*   Lab Work: IN TWO WEEKS: BMET BNP MAG  If you have labs (blood work) drawn today and your tests are completely normal, you will receive your results only by: [place] (if you have MyChart) OR A paper copy in the mail If you have any lab test that is abnormal or we need to change your treatment, we will call you to review the results.   Testing/Procedures: Your physician has requested that you have an echocardiogram. Echocardiography is a painless test that uses sound waves to create images of your heart. It provides your doctor with information about the size and shape of your heart and how well your hearts chambers and valves are working. This procedure takes approximately one hour. There are no restrictions for this procedure.    Follow-Up: At [facility], you and your health needs are our priority.  As part of our continuing mission to provide you with exceptional heart care, we have created designated Provider Care Teams.  These Care Teams include your primary Cardiologist (physician) and Advanced Practice Providers (APPs -  Physician Assistants and Nurse Practitioners) who all work together to provide you with the care you need, when you need it.  We recommend signing up for the patient portal called "MyChart".  Sign up information is provided on this After Visit Summary.  MyChart is used to connect with patients for Virtual Visits (Telemedicine).  Patients are able to view lab/test results, encounter notes, upcoming appointments, etc.  Non-urgent messages can be sent to your provider as well.   To learn more about what you can do with MyChart, go to  [email or web address].    Your next appointment:   3-4 week(s)  The format for your next appointment:   In Person  Provider:   You may see Carlyle Dolly, MD or one of the following Advanced Practice Providers on your designated Care Team:   Bernerd Pho, PA-C  Ermalinda Barrios, PA-C     Other Instructions Call our office Friday with updated weight and swelling.

## 2021-10-26 ENCOUNTER — Other Ambulatory Visit: Payer: Self-pay

## 2021-10-26 ENCOUNTER — Ambulatory Visit
Admission: RE | Admit: 2021-10-26 | Discharge: 2021-10-26 | Disposition: A | Discharge status: Home / Self Care | Payer: Medicare Other | Source: Ambulatory Visit | Attending: Radiation Oncology | Admitting: Radiation Oncology

## 2021-10-26 DIAGNOSIS — C678 Malignant neoplasm of overlapping sites of bladder: Secondary | ICD-10-CM | POA: Diagnosis not present

## 2021-10-26 DIAGNOSIS — C670 Malignant neoplasm of trigone of bladder: Secondary | ICD-10-CM | POA: Diagnosis not present

## 2021-10-26 DIAGNOSIS — Z510 Encounter for antineoplastic radiation therapy: Secondary | ICD-10-CM | POA: Diagnosis not present

## 2021-10-26 DIAGNOSIS — Z5111 Encounter for antineoplastic chemotherapy: Secondary | ICD-10-CM | POA: Diagnosis not present

## 2021-10-26 DIAGNOSIS — Z79899 Other long term (current) drug therapy: Secondary | ICD-10-CM | POA: Diagnosis not present

## 2021-10-27 ENCOUNTER — Ambulatory Visit
Admission: RE | Admit: 2021-10-27 | Discharge: 2021-10-27 | Disposition: A | Discharge status: Home / Self Care | Payer: Medicare Other | Source: Ambulatory Visit | Attending: Radiation Oncology | Admitting: Radiation Oncology

## 2021-10-27 DIAGNOSIS — C678 Malignant neoplasm of overlapping sites of bladder: Secondary | ICD-10-CM | POA: Diagnosis not present

## 2021-10-27 DIAGNOSIS — Z510 Encounter for antineoplastic radiation therapy: Secondary | ICD-10-CM | POA: Diagnosis not present

## 2021-10-27 DIAGNOSIS — Z5111 Encounter for antineoplastic chemotherapy: Secondary | ICD-10-CM | POA: Diagnosis not present

## 2021-10-27 DIAGNOSIS — Z79899 Other long term (current) drug therapy: Secondary | ICD-10-CM | POA: Diagnosis not present

## 2021-10-27 DIAGNOSIS — C670 Malignant neoplasm of trigone of bladder: Secondary | ICD-10-CM | POA: Diagnosis not present

## 2021-10-28 ENCOUNTER — Other Ambulatory Visit: Payer: Self-pay

## 2021-10-28 ENCOUNTER — Inpatient Hospital Stay (HOSPITAL_BASED_OUTPATIENT_CLINIC_OR_DEPARTMENT_OTHER): Payer: Medicare Other | Admitting: Oncology

## 2021-10-28 ENCOUNTER — Inpatient Hospital Stay: Payer: Medicare Other

## 2021-10-28 VITALS — BP 118/87 | HR 94 | Temp 97.9°F | Resp 17 | Ht 72.0 in | Wt 195.9 lb

## 2021-10-28 DIAGNOSIS — Z79899 Other long term (current) drug therapy: Secondary | ICD-10-CM | POA: Diagnosis not present

## 2021-10-28 DIAGNOSIS — Z5111 Encounter for antineoplastic chemotherapy: Secondary | ICD-10-CM | POA: Diagnosis not present

## 2021-10-28 DIAGNOSIS — C679 Malignant neoplasm of bladder, unspecified: Secondary | ICD-10-CM

## 2021-10-28 DIAGNOSIS — C670 Malignant neoplasm of trigone of bladder: Secondary | ICD-10-CM | POA: Diagnosis not present

## 2021-10-28 DIAGNOSIS — Z510 Encounter for antineoplastic radiation therapy: Secondary | ICD-10-CM | POA: Diagnosis not present

## 2021-10-28 DIAGNOSIS — C678 Malignant neoplasm of overlapping sites of bladder: Secondary | ICD-10-CM | POA: Diagnosis not present

## 2021-10-28 LAB — CBC WITH DIFFERENTIAL (CANCER CENTER ONLY)
Abs Immature Granulocytes: 0.01 10*3/uL (ref 0.00–0.07)
Basophils Absolute: 0 10*3/uL (ref 0.0–0.1)
Basophils Relative: 0 %
Eosinophils Absolute: 0 10*3/uL (ref 0.0–0.5)
Eosinophils Relative: 1 %
HCT: 36.2 % — ABNORMAL LOW (ref 39.0–52.0)
Hemoglobin: 11.9 g/dL — ABNORMAL LOW (ref 13.0–17.0)
Immature Granulocytes: 0 %
Lymphocytes Relative: 22 %
Lymphs Abs: 0.5 10*3/uL — ABNORMAL LOW (ref 0.7–4.0)
MCH: 32.1 pg (ref 26.0–34.0)
MCHC: 32.9 g/dL (ref 30.0–36.0)
MCV: 97.6 fL (ref 80.0–100.0)
Monocytes Absolute: 0.7 10*3/uL (ref 0.1–1.0)
Monocytes Relative: 28 %
Neutro Abs: 1.2 10*3/uL — ABNORMAL LOW (ref 1.7–7.7)
Neutrophils Relative %: 49 %
Platelet Count: 151 10*3/uL (ref 150–400)
RBC: 3.71 MIL/uL — ABNORMAL LOW (ref 4.22–5.81)
RDW: 20.4 % — ABNORMAL HIGH (ref 11.5–15.5)
WBC Count: 2.4 10*3/uL — ABNORMAL LOW (ref 4.0–10.5)
nRBC: 0.8 % — ABNORMAL HIGH (ref 0.0–0.2)

## 2021-10-28 LAB — CMP (CANCER CENTER ONLY)
ALT: 12 U/L (ref 0–44)
AST: 24 U/L (ref 15–41)
Albumin: 3.9 g/dL (ref 3.5–5.0)
Alkaline Phosphatase: 47 U/L (ref 38–126)
Anion gap: 9 (ref 5–15)
BUN: 17 mg/dL (ref 8–23)
CO2: 32 mmol/L (ref 22–32)
Calcium: 9.1 mg/dL (ref 8.9–10.3)
Chloride: 101 mmol/L (ref 98–111)
Creatinine: 1.17 mg/dL (ref 0.61–1.24)
GFR, Estimated: >60 mL/min (ref >60)
Glucose, Bld: 96 mg/dL (ref 70–99)
Potassium: 3.8 mmol/L (ref 3.5–5.1)
Sodium: 142 mmol/L (ref 135–145)
Total Bilirubin: 1.3 mg/dL — ABNORMAL HIGH (ref 0.3–1.2)
Total Protein: 6.8 g/dL (ref 6.5–8.1)

## 2021-10-28 NOTE — Progress Notes (Signed)
Hematology and Oncology Follow Up Visit  Kenneth [name] [id number] 11/25/1950 71 y.o. 10/28/2021 2:58 PM Kenneth [name], MDVyas, Kenneth Hatcher, MD   Principle Diagnosis: 71 year old with bladder cancer diagnosed in September 2022.  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  Prior Therapy: He is status post TURBT on June 17, 2021. 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    Definitive therapy with radiation and weekly cisplatin.  He completed therapy in December 2022.  Current therapy: Active surveillance.  Interim History: Kenneth [name] presents today for a follow-up visit.  Since the last visit, he had completed definitive treatment with radiation and chemotherapy without any major complications.  He has reported some fatigue, tiredness and bilateral lower extremity edema.  He is currently on diuretics at this time which has improved his edema.  He is eating better and weight is improving.  He denies any nausea, vomiting or worsening neuropathy.     Medications: Updated on review. Current Outpatient Medications  Medication Sig Dispense Refill   acetaminophen (TYLENOL) 500 MG tablet Take 500-1,000 mg by mouth every 6 (six) hours as needed for moderate pain.     alendronate (FOSAMAX) 70 MG tablet Take 70 mg by mouth once a week. Takes on Thursday's ;  Take with a full glass of water on an empty stomach.     apixaban (ELIQUIS) 5 MG TABS tablet Take 1 tablet (5 mg total) by mouth 2 (two) times daily. (Patient not taking: Reported on 10/14/2021) 60 tablet 0   Ascorbic Acid (VITAMIN C) 1000 MG tablet Take 1,000 mg by mouth 2 (two) times daily.     bacitracin-neomycin-polymyxin-hydrocortisone (CORTISPORIN) 1 % ointment Apply 1 application topically 2 (two) times daily. 15 g 3   calcium-vitamin D (OSCAL WITH D) 500-200 MG-UNIT  tablet Take 1 tablet by mouth 2 (two) times daily.     furosemide (LASIX) 40 MG tablet Take 1 tablet (40 mg total) by mouth daily. 90 tablet 3   loperamide (IMODIUM A-D) 2 MG tablet Take 2 mg by mouth 4 (four) times daily as needed for diarrhea or loose stools.     Magnesium 250 MG TABS Take 250 mg by mouth daily.     metoprolol tartrate (LOPRESSOR) 25 MG tablet Take 1.5 tablets (37.5 mg total) by mouth 2 (two) times daily. 270 tablet 3   Multiple Vitamins-Minerals (MULTIVITAMIN PO) Take 1 tablet by mouth daily.     omeprazole (PRILOSEC) 20 MG capsule Take 20 mg by mouth daily.     Polyethyl Glycol-Propyl Glycol (SYSTANE OP) Place 1 drop into both eyes daily as needed (dry eyes).     prochlorperazine (COMPAZINE) 10 MG tablet Take 1 tablet (10 mg total) by mouth every 6 (six) hours as needed for nausea or vomiting. 30 tablet 0   pseudoephedrine (SUDAFED) 30 MG tablet Take 30 mg by mouth daily as needed for congestion. (Patient not taking: Reported on 10/25/2021)     sildenafil (REVATIO) 20 MG tablet Take 20-60 mg by mouth daily as needed (erectile dysfunction).     tiZANidine (ZANAFLEX) 4 MG capsule Take 4 mg by mouth 2 (two) times daily as needed for muscle spasms.     No current facility-administered medications for this visit.     Allergies: No Known Allergies    Physical Exam:   Blood pressure 118/87, pulse 94,  temperature 97.9 F (36.6 C), temperature source Temporal, resp. rate 17, height 6' (1.829 m), weight 195 lb 14.4 oz (88.9 kg), SpO2 99 %.   ECOG: 1    General appearance: Comfortable appearing without any discomfort Head: Normocephalic without any trauma Oropharynx: Mucous membranes are moist and pink without any thrush or ulcers. Eyes: Pupils are equal and round reactive to light. Lymph nodes: No cervical, supraclavicular, inguinal or axillary lymphadenopathy.   Heart:regular rate and rhythm.  S1 and S2 without leg edema. Lung: Clear without any rhonchi or wheezes.   No dullness to percussion. Abdomin: Soft, nontender, nondistended with good bowel sounds.  No hepatosplenomegaly. Musculoskeletal: No joint deformity or effusion.  Full range of motion noted. Neurological: No deficits noted on motor, sensory and deep tendon reflex exam. Skin: No petechial rash or dryness.  Appeared moist.         Lab Results: Lab Results  Component Value Date   WBC 2.1 (L) 10/14/2021   HGB 11.9 (L) 10/14/2021   HCT 36.7 (L) 10/14/2021   MCV 95.6 10/14/2021   PLT 98 (L) 10/14/2021     Chemistry      Component Value Date/Time   NA 140 10/14/2021 0857   K 3.8 10/14/2021 0857   CL 103 10/14/2021 0857   CO2 24 10/14/2021 0857   BUN 17 10/14/2021 0857   CREATININE 1.10 10/14/2021 0857   CREATININE 0.78 07/19/2021 1019      Component Value Date/Time   CALCIUM 8.8 (L) 10/14/2021 0857   ALKPHOS 54 10/14/2021 0857   AST 21 10/14/2021 0857   ALT 13 10/14/2021 0857   BILITOT 1.2 10/14/2021 0857         Impression and Plan:  71 year old with:  1.  Bladder cancer diagnosed in September 2022.  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 .   His disease status was updated at this time and treatment choices were reviewed.  He has completed definitive therapy with radiation and cisplatin based therapy.  At this time, I recommended continued active surveillance.  Repeat cystoscopy and imaging studies will be scheduled in the near future.     2.  IV access: No complications related to peripheral veins at this time.   3.  Antiemetics: No delayed nausea or vomiting reported currently.   4.  Renal function surveillance: His creatinine remained within normal range after cisplatin therapy.   5.  Myelosuppression and cytopenia: Related to chemotherapy and counts are improving at this time.   6.  Follow-up: In 6 months for repeat follow-up.   30  minutes were dedicated to this visit.  Time spent on reviewing laboratory data, disease status update,  treatment choices and future plan of care reviewed.   Zola Button, MD 1/20/20232:58 PM

## 2021-11-04 ENCOUNTER — Telehealth: Payer: Self-pay | Admitting: Cardiology

## 2021-11-04 NOTE — Telephone Encounter (Signed)
Pt c/o swelling: STAT is pt has developed SOB within 24 hours  How much weight have you gained and in what time span?  No weight gain   If swelling, where is the swelling located?  Feet and legs   Are you currently taking a fluid pill?  Yes, patient takes furosemide (LASIX) 40 MG tablet once daily  Are you currently SOB?  No   Do you have a log of your daily weights (if so, list)?  1/19 7:15 AM - 192 lbs 1/20 6:48 -  191.8 lbs 1/21 3:00 PM - 193 lbs 1/22 9:00 PM - 190 lbs 1/23 9:00 PM - 189.8 lbs  1/24 7:00 PM - 188.8 lbs  1/25 6:22 PM - 189.6 lbs   Have you gained 3 pounds in a day or 5 pounds in a week?  No   Have you traveled recently?  No

## 2021-11-04 NOTE — Telephone Encounter (Signed)
2. Acute on chronic diastolic heart failure - significant LE edema that is new for him, not improved with lasix 20mg  daily - increase lasix to 40mg  daily. Check bmet/mg/bnp in 2 weeks - he will call Friday to update [place] on swelling and home weights - concern about tachy mediated CM given ongoign afib with high rates and new edema and SOB/DOE. Recheck echo.          I will forward to Dr.Branch for review

## 2021-11-08 NOTE — Telephone Encounter (Signed)
Weight is slowly trending down, how is the swelling in his legs. If ongoing verify taking lasix 40mg  and would increase to 60mg  daily and have him update [place] again on Friday  J Safire Gordin MD

## 2021-11-09 MED ORDER — FUROSEMIDE 40 MG PO TABS: 60.0000 mg | ORAL_TABLET | Freq: Every day | ORAL | 3 refills | Status: DC

## 2021-11-09 NOTE — Telephone Encounter (Signed)
Pt stated that his legs, feet, ankles, toes "look like they're about to explode". Pt verified taking lasix 40 mg daily and verbalized agreement to increase lasix dose to 60 mg daily. Pt will call back Friday with weights and swelling.   Pt medication chart updated to reflect change in lasix dosing.

## 2021-11-14 DIAGNOSIS — H7011 Chronic mastoiditis, right ear: Secondary | ICD-10-CM | POA: Diagnosis not present

## 2021-11-14 NOTE — Telephone Encounter (Signed)
Pt states that swelling had barely decreased. Pt stated that weight is maintaining at 188 lbs. Pt confirmed that he is still taking lasix 60 mg tablets daily.   Please advise.

## 2021-11-15 NOTE — Telephone Encounter (Signed)
If no improvement on 60mg  daily increase to 80mg  of lasix once daily, update [place] again on Friday please weights and swelling.    Zandra Abts MD

## 2021-11-16 DIAGNOSIS — C673 Malignant neoplasm of anterior wall of bladder: Secondary | ICD-10-CM | POA: Diagnosis not present

## 2021-11-16 MED ORDER — FUROSEMIDE 80 MG PO TABS: 80.0000 mg | ORAL_TABLET | Freq: Every day | ORAL | 3 refills | Status: DC

## 2021-11-16 NOTE — Telephone Encounter (Signed)
Pt verbalized understanding to increase lasix to 80 mg daily. Pt will update office of weights and swelling on Friday. Pt advised to send MyChart message if he could not get through call center.

## 2021-11-22 ENCOUNTER — Ambulatory Visit (HOSPITAL_COMMUNITY)
Admission: RE | Admit: 2021-11-22 | Discharge: 2021-11-22 | Disposition: A | Discharge status: Home / Self Care | Payer: Medicare Other | Source: Ambulatory Visit | Attending: Cardiology | Admitting: Cardiology

## 2021-11-22 DIAGNOSIS — I4891 Unspecified atrial fibrillation: Secondary | ICD-10-CM | POA: Insufficient documentation

## 2021-11-22 LAB — ECHOCARDIOGRAM COMPLETE
AR max vel: 1.97 cm2
AV Area VTI: 1.9 cm2
AV Area mean vel: 1.94 cm2
AV Mean grad: 1.3 mmHg
AV Peak grad: 2.7 mmHg
Ao pk vel: 0.83 m/s
Area-P 1/2: 3.1 cm2
MV VTI: 1.58 cm2
S' Lateral: 3.7 cm

## 2021-11-22 NOTE — Progress Notes (Signed)
*  PRELIMINARY RESULTS* Echocardiogram 2D Echocardiogram has been performed.  Kenneth [name] 11/22/2021, 9:25 AM

## 2021-11-23 ENCOUNTER — Encounter: Payer: Self-pay | Admitting: Cardiology

## 2021-11-25 ENCOUNTER — Encounter: Payer: Self-pay | Admitting: Cardiology

## 2021-11-29 ENCOUNTER — Telehealth: Payer: Self-pay

## 2021-11-29 ENCOUNTER — Encounter: Payer: Self-pay | Admitting: Oncology

## 2021-11-29 DIAGNOSIS — J069 Acute upper respiratory infection, unspecified: Secondary | ICD-10-CM | POA: Diagnosis not present

## 2021-11-29 DIAGNOSIS — I10 Essential (primary) hypertension: Secondary | ICD-10-CM | POA: Diagnosis not present

## 2021-11-29 DIAGNOSIS — I4891 Unspecified atrial fibrillation: Secondary | ICD-10-CM | POA: Diagnosis not present

## 2021-11-29 DIAGNOSIS — J449 Chronic obstructive pulmonary disease, unspecified: Secondary | ICD-10-CM | POA: Diagnosis not present

## 2021-11-29 DIAGNOSIS — Z299 Encounter for prophylactic measures, unspecified: Secondary | ICD-10-CM | POA: Diagnosis not present

## 2021-11-29 NOTE — Progress Notes (Signed)
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     Indication for treatment:  Curative, Definitive Radiotherapy       Radiation treatment dates:   09/06/22 - 10/22/21  Site/dose:  1. The bladder tumor only was initially boosted to 19.8 Gy with 11 additional fractions of 1.8 Gy with a full bladder set-up  2. The bladder and pelvic lymph nodes were subsequently treated to 45 Gy in 25 fractions of 1.8 Gy with empty bladder carrying the total tumor dose to 64.8 Gy  Beams/energy:  1. The bladder tumor only was boosted using 3D 15 megavolt photons. Image guidance was performed with CB-CT studies prior to each fraction. The patient was immobilized with a body fix lower extremity mold. 2. The bladder and pelvic lymph nodes were subsequently treated using 3D radiotherapy delivering 15 megavolt photons. Image guidance was performed with CB-CT studies prior to each fraction. The patient was immobilized with a body fix lower extremity mold.   Narrative: The patient tolerated radiation treatment relatively well.   The patient experienced some minor urinary irritation and modest fatigue.    Plan: The patient has completed radiation treatment. He will return to radiation oncology clinic for routine followup in one month. I advised him to call or return sooner if he has any questions or concerns related to his recovery or treatment. ________________________________  Sheral Apley. Tammi Klippel, M.D.

## 2021-11-29 NOTE — Telephone Encounter (Signed)
Called patient and left voicemail reminder of his 10:30am-11/30/21 telephone appointment w/ Ashlyn Bruning PA-C. I left my extension [phone] and asked that patient return my call prior to appointment time/ date so that I may complete the nursing portion of patient's appointment.

## 2021-11-30 ENCOUNTER — Encounter: Payer: Self-pay | Admitting: Urology

## 2021-11-30 ENCOUNTER — Ambulatory Visit
Admission: RE | Admit: 2021-11-30 | Discharge: 2021-11-30 | Disposition: A | Discharge status: Home / Self Care | Payer: Medicare Other | Source: Ambulatory Visit | Attending: Urology | Admitting: Urology

## 2021-11-30 DIAGNOSIS — C670 Malignant neoplasm of trigone of bladder: Secondary | ICD-10-CM

## 2021-11-30 NOTE — Progress Notes (Signed)
Spoke w/ patient, verbalized identity, and begin nursing interview. Patient reports insomnia and fatigue (managed). No other problems reported at this time. Patient denies any bladder issues.  Meaningful use complete. Urology appointment- May 3rd, 2023 -per [facility]  Patient reminded of his 10:30am-11/30/21 telephone appointment w/ Ashlyn Bruning PA-C. I left my extension [phone] in case patient needs to call. Patient verbalized understanding of information.

## 2021-11-30 NOTE — Progress Notes (Signed)
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     Interval Since Last Radiation:  5.5 weeks  09/06/22 - 10/22/21: 1. The bladder tumor only was initially boosted to 19.8 Gy with 11 additional fractions of 1.8 Gy with a full bladder set-up  2. The bladder and pelvic lymph nodes were subsequently treated to 45 Gy in 25 fractions of 1.8 Gy with empty bladder carrying the total tumor dose to 64.8 Gy  Narrative:  I spoke with the patient to conduct his routine scheduled 1 month follow up visit via telephone to spare the patient unnecessary potential exposure in the healthcare setting during the current COVID-19 pandemic.  The patient was notified in advance and gave permission to proceed with this visit format.                              He  tolerated radiation treatment relatively well with only minor urinary irritation and modest fatigue.    On review of systems, the patient states that he is doing very well in general. He admits that the last week of treatment and first 3 weeks after treatment were rough with dysuria, frequency, urgency and nocturia but that has since improved significantly. He also reports that his bowels are back to baseline and denies abdominal pain, N/V, diarrhea or constipation. He still has some lingering fatigue but feels that this is gradually improving and overall, he is quite pleased with his progress to date. The chemotherapy was stopped after 4 cycles due to myelosuppression resulting in significant cytopenia and fatigue/weakness but he was able to complete the full course of radiation. He is currently on observation only and had a follow up with Dr. Junious Silk 11/16/21 with plans to repeat a cystoscopy on  02/08/22.   ALLERGIES:  has No Known Allergies.  Meds: Current Outpatient Medications  Medication Sig Dispense Refill   acetaminophen (TYLENOL) 500 MG tablet Take 500-1,000 mg by mouth every 6 (six) hours as needed for moderate pain.     alendronate (FOSAMAX) 70 MG tablet Take 70 mg by mouth once a week. Takes on Thursday's ;  Take with a full glass of water on an empty stomach.     Ascorbic Acid (VITAMIN C) 1000 MG tablet Take 1,000 mg by mouth 2 (two) times daily.     bacitracin-neomycin-polymyxin-hydrocortisone (CORTISPORIN) 1 % ointment Apply 1 application topically 2 (two) times daily. 15 g 3   calcium-vitamin D (OSCAL WITH D) 500-200 MG-UNIT tablet Take 1 tablet by mouth 2 (two) times daily.     furosemide (LASIX) 80 MG tablet Take 1 tablet (80 mg total) by mouth daily. 90 tablet 3   loperamide (IMODIUM A-D) 2 MG tablet Take 2 mg by mouth 4 (four) times daily as needed for diarrhea or loose stools.     Magnesium 250 MG TABS Take 250 mg by mouth daily.     metoprolol tartrate (LOPRESSOR) 25 MG tablet Take 1.5 tablets (37.5 mg total) by mouth 2 (two) times daily. 270 tablet 3   Multiple Vitamins-Minerals (MULTIVITAMIN PO) Take 1 tablet by mouth daily.     omeprazole (PRILOSEC) 20 MG capsule Take  20 mg by mouth daily.     Polyethyl Glycol-Propyl Glycol (SYSTANE OP) Place 1 drop into both eyes daily as needed (dry eyes).     sildenafil (REVATIO) 20 MG tablet Take 20-60 mg by mouth daily as needed (erectile dysfunction).     tiZANidine (ZANAFLEX) 4 MG capsule Take 4 mg by mouth 2 (two) times daily as needed for muscle spasms.     apixaban (ELIQUIS) 5 MG TABS tablet Take 1 tablet (5 mg total) by mouth 2 (two) times daily. (Patient not taking: Reported on 10/14/2021) 60 tablet 0   prochlorperazine (COMPAZINE) 10 MG tablet Take 1 tablet (10 mg total) by mouth every 6 (six) hours as needed for nausea or vomiting. (Patient not taking: Reported on 11/30/2021) 30 tablet 0   pseudoephedrine (SUDAFED) 30  MG tablet Take 30 mg by mouth daily as needed for congestion. (Patient not taking: Reported on 10/25/2021)     No current facility-administered medications for this encounter.    Physical Findings:  vitals were not taken for this visit.  Pain Assessment Pain Score: 0-No pain/10 Unable to assess due to telephone follow-up visit format.  Lab Findings: Lab Results  Component Value Date   WBC 2.4 (L) 10/28/2021   HGB 11.9 (L) 10/28/2021   HCT 36.2 (L) 10/28/2021   MCV 97.6 10/28/2021   PLT 151 10/28/2021     Radiographic Findings: ECHOCARDIOGRAM COMPLETE  Result Date: 11/22/2021    ECHOCARDIOGRAM REPORT   Patient Name:   Kenneth [name] Date of Exam: 11/22/2021 Medical Rec #:  [id number]     Height:       72.0 in Accession #:    [id number]    Weight:       195.9 lb Date of Birth:  1951-04-02     BSA:          2.112 m Patient Age:    75 years      BP:           104/70 mmHg Patient Gender: M             HR:           110 bpm. Exam Location:  [facility] Procedure: 2D Echo, Cardiac Doppler and Color Doppler Indications:    Atrial Fibrillation  History:        Patient has prior history of Echocardiogram examinations, most                 recent 02/10/2021. COPD, Arrythmias:Bradycardia and Atrial                 Fibrillation; Risk Factors:Former Smoker.  Sonographer:    Wenda Low Referring Phys: [id number] [place]  1. Left ventricular ejection fraction, by estimation, is 35 to 40%. The left ventricle has moderately decreased function. The left ventricle demonstrates global hypokinesis. There is moderate left ventricular hypertrophy. Left ventricular diastolic parameters are indeterminate.  2. Right ventricular systolic function is moderately reduced. The right ventricular size is severely enlarged. There is mildly elevated pulmonary artery systolic pressure.  3. Left atrial size was severely dilated.  4. Right atrial size was severely dilated.  5. The mitral valve is normal in  structure. Mild mitral valve regurgitation. No evidence of mitral stenosis.  6. The tricuspid valve is abnormal. Tricuspid valve regurgitation is moderate to severe.  7. The aortic valve is tricuspid. Aortic valve regurgitation is not visualized. No aortic stenosis is present.  8. The inferior vena cava is  dilated in size with <50% respiratory variability, suggesting right atrial pressure of 15 mmHg. FINDINGS  Left Ventricle: Left ventricular ejection fraction, by estimation, is 35 to 40%. The left ventricle has moderately decreased function. The left ventricle demonstrates global hypokinesis. The left ventricular internal cavity size was normal in size. There is moderate left ventricular hypertrophy. Left ventricular diastolic parameters are indeterminate. Right Ventricle: The right ventricular size is severely enlarged. Right vetricular wall thickness was not well visualized. Right ventricular systolic function is moderately reduced. There is mildly elevated pulmonary artery systolic pressure. The tricuspid regurgitant velocity is 2.36 m/s, and with an assumed right atrial pressure of 15 mmHg, the estimated right ventricular systolic pressure is 41.3 mmHg. Left Atrium: Left atrial size was severely dilated. Right Atrium: Right atrial size was severely dilated. Pericardium: There is no evidence of pericardial effusion. Mitral Valve: The mitral valve is normal in structure. Mild mitral valve regurgitation. No evidence of mitral valve stenosis. MV peak gradient, 2.0 mmHg. The mean mitral valve gradient is 0.5 mmHg. Tricuspid Valve: The tricuspid valve is abnormal. Tricuspid valve regurgitation is moderate to severe. No evidence of tricuspid stenosis. Aortic Valve: The aortic valve is tricuspid. Aortic valve regurgitation is not visualized. No aortic stenosis is present. Aortic valve mean gradient measures 1.3 mmHg. Aortic valve peak gradient measures 2.7 mmHg. Aortic valve area, by VTI measures 1.90 cm. Pulmonic  Valve: The pulmonic valve was not well visualized. Pulmonic valve regurgitation is mild. No evidence of pulmonic stenosis. Aorta: The aortic root is normal in size and structure. Venous: The inferior vena cava is dilated in size with less than 50% respiratory variability, suggesting right atrial pressure of 15 mmHg. IAS/Shunts: No atrial level shunt detected by color flow Doppler.  LEFT VENTRICLE PLAX 2D LVIDd:         4.70 cm   Diastology LVIDs:         3.70 cm   LV e' medial:    7.42 cm/s LV PW:         1.30 cm   LV E/e' medial:  8.0 LV IVS:        1.30 cm   LV e' lateral:   14.30 cm/s LVOT diam:     2.00 cm   LV E/e' lateral: 4.1 LV SV:         28 LV SV Index:   13 LVOT Area:     3.14 cm  RIGHT VENTRICLE RV Basal diam:  4.95 cm RV Mid diam:    3.90 cm RV S prime:     7.89 cm/s TAPSE (M-mode): 1.5 cm LEFT ATRIUM              Index        RIGHT ATRIUM           Index LA diam:        5.10 cm  2.41 cm/m   RA Area:     48.90 cm LA Vol (A2C):   148.0 ml 70.08 ml/m  RA Volume:   234.00 ml 110.80 ml/m LA Vol (A4C):   100.0 ml 47.35 ml/m LA Biplane Vol: 134.0 ml 63.45 ml/m  AORTIC VALVE                    PULMONIC VALVE AV Area (Vmax):    1.97 cm     PV Vmax:       0.37 m/s AV Area (Vmean):   1.94 cm     PV Peak grad:  0.6 mmHg AV Area (VTI):     1.90 cm AV Vmax:           82.60 cm/s AV Vmean:          55.433 cm/s AV VTI:            0.145 m AV Peak Grad:      2.7 mmHg AV Mean Grad:      1.3 mmHg LVOT Vmax:         51.85 cm/s LVOT Vmean:        34.250 cm/s LVOT VTI:          0.088 m LVOT/AV VTI ratio: 0.60  AORTA Ao Root diam: 3.80 cm MITRAL VALVE               TRICUSPID VALVE MV Area (PHT): 3.10 cm    TR Peak grad:   22.3 mmHg MV Area VTI:   1.58 cm    TR Vmax:        236.00 cm/s MV Peak grad:  2.0 mmHg MV Mean grad:  0.5 mmHg    SHUNTS MV Vmax:       0.70 m/s    Systemic VTI:  0.09 m MV Vmean:      34.2 cm/s   Systemic Diam: 2.00 cm MV Decel Time: 245 msec MV E velocity: 59.10 cm/s Carlyle Dolly MD  Electronically signed by Carlyle Dolly MD Signature Date/Time: 11/22/2021/11:23:20 AM    Final     Impression/Plan: 1. 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    He appears to have recovered well from the effects of his recent chemoradiation and is currently without complaints.  We discussed that while we are happy to continue to participate in his care if clinically indicated, at this point, we will plan to see him back on an as-needed basis.  He will continue in routine follow-up under the care and direction of Dr. Alen Blew and Dr. Junious Silk for continued management of his systemic disease.  He will see Dr. Junious Silk on 02/08/2022 for an office cystoscopy and is scheduled for a follow-up with Dr. Alen Blew on 04/28/2022.  We enjoyed taking care of him and will look forward to continuing to follow his progress via correspondence.  He knows that he is welcome to call at anytime with any questions or concerns related to his previous radiation.    Nicholos Johns, PA-C

## 2021-12-02 ENCOUNTER — Other Ambulatory Visit: Payer: Self-pay | Admitting: *Deleted

## 2021-12-02 ENCOUNTER — Telehealth: Payer: Self-pay | Admitting: *Deleted

## 2021-12-02 ENCOUNTER — Ambulatory Visit: Payer: Medicare Other | Admitting: Cardiology

## 2021-12-02 DIAGNOSIS — I5033 Acute on chronic diastolic (congestive) heart failure: Secondary | ICD-10-CM

## 2021-12-02 NOTE — Telephone Encounter (Signed)
Laurine Blazer, LPN  4/97/5300 51:10 AM EST Back to Top    Notified, copy to pcp.  Lab orders given - he will go to [facility] in [place] today.  OV given as requested for Monday, 12/05/2021 at 2:00 with Dr. Kathe Mariner office.   Laurine Blazer, LPN  11/19/1733  6:70 AM EST     Left message to return call.   Arnoldo Lenis, MD  11/25/2021  4:49 PM EST     Echo does show some new weakness in the heart muscle, can he see me Mon Feb 27 at 2pm to discuss possible adjustements in medications   Jan 19 wet was 192 pounds, from epic message 179 lbs. Has made some progress fluid wise, how is the swelling doing? He needs a bmet/mg/bnp next week please       Zandra Abts MD

## 2021-12-03 ENCOUNTER — Encounter: Payer: Self-pay | Admitting: Oncology

## 2021-12-03 LAB — MAGNESIUM: Magnesium: 2.1 mg/dL (ref 1.5–2.5)

## 2021-12-03 LAB — BRAIN NATRIURETIC PEPTIDE: Brain Natriuretic Peptide: 846 pg/mL — ABNORMAL HIGH (ref <100)

## 2021-12-03 LAB — BASIC METABOLIC PANEL
BUN: 23 mg/dL (ref 7–25)
CO2: 23 mmol/L (ref 20–32)
Calcium: 9.8 mg/dL (ref 8.6–10.3)
Chloride: 104 mmol/L (ref 98–110)
Creat: 1.16 mg/dL (ref 0.70–1.28)
Glucose, Bld: 92 mg/dL (ref 65–139)
Potassium: 5.5 mmol/L — ABNORMAL HIGH (ref 3.5–5.3)
Sodium: 139 mmol/L (ref 135–146)

## 2021-12-05 ENCOUNTER — Ambulatory Visit (INDEPENDENT_AMBULATORY_CARE_PROVIDER_SITE_OTHER): Payer: Medicare Other | Admitting: Cardiology

## 2021-12-05 ENCOUNTER — Other Ambulatory Visit: Payer: Self-pay

## 2021-12-05 ENCOUNTER — Ambulatory Visit: Payer: Medicare Other | Admitting: Cardiology

## 2021-12-05 ENCOUNTER — Encounter: Payer: Self-pay | Admitting: Cardiology

## 2021-12-05 VITALS — BP 118/80 | HR 126 | Ht 72.0 in | Wt 188.0 lb

## 2021-12-05 DIAGNOSIS — I4891 Unspecified atrial fibrillation: Secondary | ICD-10-CM

## 2021-12-05 DIAGNOSIS — I5043 Acute on chronic combined systolic (congestive) and diastolic (congestive) heart failure: Secondary | ICD-10-CM

## 2021-12-05 MED ORDER — LOSARTAN POTASSIUM 25 MG PO TABS: 12.5000 mg | ORAL_TABLET | Freq: Every day | ORAL | 3 refills | Status: DC

## 2021-12-05 MED ORDER — METOPROLOL SUCCINATE ER 25 MG PO TB24: 25.0000 mg | ORAL_TABLET | Freq: Every day | ORAL | 3 refills | Status: DC

## 2021-12-05 MED ORDER — METOPROLOL SUCCINATE ER 100 MG PO TB24
100.0000 mg | ORAL_TABLET | Freq: Every day | ORAL | 3 refills | Status: DC
Start: 2021-12-05 — End: 2022-01-04
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
## 2021-12-05 NOTE — Patient Instructions (Addendum)
Medication Instructions:  STOP Lopressor   START Toprol XL 100 mg daily   START Losartan 12.5 mg daily  Re-start Eliquis 5 mg twice a day  Labwork: None today  Testing/Procedures: None today  Follow-Up: Nurse visit Friday for EKG/VS   Keep appointment with M.Lenze, PA-C  Any Other Special Instructions Will Be Listed Below (If Applicable).  If you need a refill on your cardiac medications before your next appointment, please call your pharmacy.
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
## 2021-12-09 ENCOUNTER — Encounter: Payer: Self-pay | Admitting: *Deleted

## 2021-12-09 ENCOUNTER — Ambulatory Visit (INDEPENDENT_AMBULATORY_CARE_PROVIDER_SITE_OTHER): Payer: Medicare Other | Admitting: *Deleted

## 2021-12-09 VITALS — BP 106/68 | HR 84 | Ht 72.0 in | Wt 182.2 lb

## 2021-12-09 DIAGNOSIS — I4891 Unspecified atrial fibrillation: Secondary | ICD-10-CM

## 2021-12-09 NOTE — Patient Instructions (Signed)
Continue same medications and plan ?We will contact you with your providers response ?

## 2021-12-09 NOTE — Progress Notes (Signed)
Present to office for nurse visit per last office note to have EKG and vitals. Denies dizziness, chest pain, sob, palpitations, swelling or bleeding. Medications reviewed. Reports taking all medications as prescribed without missed doses or side effects. EKG and vitals done. Routed to MD for review. ?

## 2021-12-13 ENCOUNTER — Other Ambulatory Visit (HOSPITAL_COMMUNITY)
Admission: RE | Admit: 2021-12-13 | Discharge: 2021-12-13 | Disposition: A | Discharge status: Home / Self Care | Payer: Medicare Other | Source: Ambulatory Visit | Attending: Physician Assistant | Admitting: Physician Assistant

## 2021-12-13 ENCOUNTER — Ambulatory Visit (INDEPENDENT_AMBULATORY_CARE_PROVIDER_SITE_OTHER): Payer: Medicare Other | Admitting: Physician Assistant

## 2021-12-13 ENCOUNTER — Ambulatory Visit (HOSPITAL_COMMUNITY)
Admission: RE | Admit: 2021-12-13 | Discharge: 2021-12-13 | Disposition: A | Discharge status: Home / Self Care | Payer: Medicare Other | Source: Ambulatory Visit | Attending: Physician Assistant | Admitting: Physician Assistant

## 2021-12-13 ENCOUNTER — Other Ambulatory Visit: Payer: Self-pay

## 2021-12-13 ENCOUNTER — Encounter: Payer: Self-pay | Admitting: Physician Assistant

## 2021-12-13 ENCOUNTER — Telehealth: Payer: Self-pay | Admitting: *Deleted

## 2021-12-13 VITALS — BP 110/56 | HR 96 | Ht 72.0 in | Wt 182.6 lb

## 2021-12-13 DIAGNOSIS — I5042 Chronic combined systolic (congestive) and diastolic (congestive) heart failure: Secondary | ICD-10-CM

## 2021-12-13 DIAGNOSIS — C670 Malignant neoplasm of trigone of bladder: Secondary | ICD-10-CM | POA: Diagnosis not present

## 2021-12-13 DIAGNOSIS — Z122 Encounter for screening for malignant neoplasm of respiratory organs: Secondary | ICD-10-CM

## 2021-12-13 DIAGNOSIS — Z87891 Personal history of nicotine dependence: Secondary | ICD-10-CM | POA: Diagnosis not present

## 2021-12-13 DIAGNOSIS — I4819 Other persistent atrial fibrillation: Secondary | ICD-10-CM | POA: Diagnosis not present

## 2021-12-13 DIAGNOSIS — R0602 Shortness of breath: Secondary | ICD-10-CM | POA: Diagnosis not present

## 2021-12-13 DIAGNOSIS — Z79899 Other long term (current) drug therapy: Secondary | ICD-10-CM

## 2021-12-13 DIAGNOSIS — R0609 Other forms of dyspnea: Secondary | ICD-10-CM | POA: Diagnosis not present

## 2021-12-13 DIAGNOSIS — I700 Atherosclerosis of aorta: Secondary | ICD-10-CM | POA: Diagnosis not present

## 2021-12-13 LAB — BASIC METABOLIC PANEL
Anion gap: 9 (ref 5–15)
BUN: 16 mg/dL (ref 8–23)
CO2: 31 mmol/L (ref 22–32)
Calcium: 8.3 mg/dL — ABNORMAL LOW (ref 8.9–10.3)
Chloride: 100 mmol/L (ref 98–111)
Creatinine, Ser: 0.82 mg/dL (ref 0.61–1.24)
GFR, Estimated: >60 mL/min (ref >60)
Glucose, Bld: 84 mg/dL (ref 70–99)
Potassium: 3.2 mmol/L — ABNORMAL LOW (ref 3.5–5.1)
Sodium: 140 mmol/L (ref 135–145)

## 2021-12-13 MED ORDER — POTASSIUM CHLORIDE CRYS ER 20 MEQ PO TBCR: 20.0000 meq | EXTENDED_RELEASE_TABLET | Freq: Every day | ORAL | 3 refills | Status: DC

## 2021-12-13 NOTE — Telephone Encounter (Signed)
Pt notified and order Placed.  ?

## 2021-12-13 NOTE — Addendum Note (Signed)
Addended by: Levonne Hubert on: 12/13/2021 03:21 PM ? ? Modules accepted: Orders ? ?

## 2021-12-13 NOTE — Patient Instructions (Signed)
Medication Instructions:  ?Your physician recommends that you continue on your current medications as directed. Please refer to the Current Medication list given to you today. ? ?*If you need a refill on your cardiac medications before your next appointment, please call your pharmacy* ? ? ?Lab Work: ?Your physician recommends that you return for lab work in: Today  ? ?If you have labs (blood work) drawn today and your tests are completely normal, you will receive your results only by: ?MyChart Message (if you have MyChart) OR ?A paper copy in the mail ?If you have any lab test that is abnormal or we need to change your treatment, we will call you to review the results. ? ? ?Testing/Procedures: ?A chest x-ray takes a picture of the organs and structures inside the chest, including the heart, lungs, and blood vessels. This test can show several things, including, whether the heart is enlarges; whether fluid is building up in the lungs; and whether pacemaker / defibrillator leads are still in place.  ? ? ?Follow-Up: ?At [facility], you and your health needs are our priority.  As part of our continuing mission to provide you with exceptional heart care, we have created designated Provider Care Teams.  These Care Teams include your primary Cardiologist (physician) and Advanced Practice Providers (APPs -  Physician Assistants and Nurse Practitioners) who all work together to provide you with the care you need, when you need it. ? ?We recommend signing up for the patient portal called "MyChart".  Sign up information is provided on this After Visit Summary.  MyChart is used to connect with patients for Virtual Visits (Telemedicine).  Patients are able to view lab/test results, encounter notes, upcoming appointments, etc.  Non-urgent messages can be sent to your provider as well.   ?To learn more about what you can do with MyChart, go to [email or web address].   ? ?Your next appointment:   ?3 -4 week(s) ? ?The format  for your next appointment:   ?In Person ? ?Provider:   ?You may see Carlyle Dolly, MD or one of the following Advanced Practice Providers on your designated Care Team:   ?Bernerd Pho, PA-C  ?Ermalinda Barrios, PA-C .  ? ? ?Other Instructions ?Thank you for choosing [facility]! ? ?  ?

## 2021-12-13 NOTE — Telephone Encounter (Signed)
-----   Message from Imogene Burn, PA-C sent at 12/13/2021  3:03 PM EST ----- ?K low 3.2. add Kdur 20 meq -2 today and tomorrow then 1 daily. Repeat bmet in 1 week. ?

## 2021-12-14 ENCOUNTER — Telehealth: Payer: Self-pay | Admitting: *Deleted

## 2021-12-14 DIAGNOSIS — R0609 Other forms of dyspnea: Secondary | ICD-10-CM

## 2021-12-14 DIAGNOSIS — R0602 Shortness of breath: Secondary | ICD-10-CM

## 2021-12-14 DIAGNOSIS — F17200 Nicotine dependence, unspecified, uncomplicated: Secondary | ICD-10-CM

## 2021-12-14 NOTE — Telephone Encounter (Signed)
Pt.notified

## 2021-12-14 NOTE — Telephone Encounter (Signed)
-----   Message from Imogene Burn, PA-C sent at 12/14/2021  7:24 AM EST ----- ?CXR shows mild diffuse chronic scarring, no CHF or pneumonia. Please order CT chest for lung cancer screening. thanks ?

## 2021-12-14 NOTE — Addendum Note (Signed)
Addended by: Levonne Hubert on: 12/14/2021 09:55 AM ? ? Modules accepted: Orders ? ?

## 2021-12-28 NOTE — Telephone Encounter (Signed)
REcent labs looked fine other than low potassium which PA Lenze addressed. The fact kidney function is normal tells [place] he is tolerating the fluid pills well. I would continue for now and we can assess at our f/u next week ? ?Zandra Abts MD ?

## 2022-01-04 ENCOUNTER — Ambulatory Visit (INDEPENDENT_AMBULATORY_CARE_PROVIDER_SITE_OTHER): Payer: Medicare Other | Admitting: Cardiology

## 2022-01-04 ENCOUNTER — Encounter: Payer: Self-pay | Admitting: Cardiology

## 2022-01-04 VITALS — BP 120/80 | HR 134 | Ht 72.0 in | Wt 186.4 lb

## 2022-01-04 DIAGNOSIS — I5043 Acute on chronic combined systolic (congestive) and diastolic (congestive) heart failure: Secondary | ICD-10-CM | POA: Diagnosis not present

## 2022-01-04 DIAGNOSIS — I4819 Other persistent atrial fibrillation: Secondary | ICD-10-CM

## 2022-01-04 MED ORDER — METOPROLOL SUCCINATE ER 100 MG PO TB24: 150.0000 mg | ORAL_TABLET | Freq: Every day | ORAL | 3 refills | Status: DC

## 2022-01-04 NOTE — Progress Notes (Signed)
? ? ? ?Clinical Summary ?Kenneth [name] is a 71 y.o.maleseen today for follow up of the following medical problems.  ?  ?1. Afib ?-previously off eliquis due to recurrent hematuria due to his bladder cancer. Also has had intermittent issues with pancytopenia on chemotherapy, including low platelets. ?09/2021 monitor: 100% afib, Patient was in afib throughout the study, range 64-213 bpm, average HR 110 ?  ?-ongoing issues with high HRs. We have been titrating up his metoprolol ?- very severe left atrial enlargement, LAVI 63. Has not been able to be on eliquis. Have not pursued trying to reestablish SR.  ?  ?- has completed current course of bladder cancer treatement. No recent hematuria, blood counts have normalized. We restarted eliquis, has been tolerating.  ?  ?- still with palpitations at times, often with exertion ? ?  ?  ?2. 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  ?02/2021 echo LVEF 55-60%, no WMAs, indet diastolic fxn, mild to mod MR, mild to mod TR ?11/22/21 LVEF 35-40%, mod RV dysfunction, severe BAE, mild MR, mod to severe TR ?  ?-recently significant LE edema, SOB/DOE, orthopnea, weight gain ?- started on oral lasix, we have titrated dose over time ?- peak weight 199 lbs, down to 188 lbs today.  ?- recent BNP 846, Cr 1.16 ?  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 Working on rate control ?  ?-ongoing edema, SOB/DOE. Orthopnea is improving.  ?  ?12/13/2021 CXR no acute process ?12/13/2021 K 3.2 Cr 0.82 BUN 16 ?-leg swelling resolved at one point. He had stopped his lasix when swelling resolved, has had some recurrence ? ?  ?  ?  ?3.Bladder cancer ?- followed by oncology and urology ?- undergoing chemo and radiation, along with urology sTURBT ? Oct 14 2021 onc note ?"he continues to receive definitive therapy with radiation and weekly cisplatin.  Chemotherapy was withheld on October 06, 2021 due to cytopenia" ?  ?- from note has compelted current treatment course, now under observation ?  ?  ? AAA screen ?Male over 46 with tobacco history. 09/2011 abdominal US without aneusyrm.  ?  ?  ? ? ?Past Medical History:  ?Diagnosis Date  ? Anticoagulant long-term use   ? eliquis--- managed by cardiology  ? Arthritis   ? osteoporosis  ? Atrial fibrillation, unspecified type ([place]) 2018  ? cardiologist--- dr j. Georgian Mcclory  ? Bladder neoplasm   ? Bradycardia   ? COPD (chronic obstructive pulmonary disease) ([place])   ? Gallbladder polyp   ? followed by surgeon, dr r. cathy (lov note in epic 06-27-2021 stated surgical management after pt has completed bladder cancer treatment  ? GERD (gastroesophageal reflux disease)   ? Heart murmur   ? History of drug abuse in remission [facility])   ? per pt in remission since 02-26-1999  ? History of hepatitis C 12/2020  ? followed by dr d. Montez Morita (aph/ [facility] and hepatology);  dx 03/ 2022, liver bx 01-12-2021 g2hepatitis, f2;  completed 12 wks treatment 04-14-2021 w/ epclusa, hcv undetectable 04-21-2021  ? Mitral valve regurgitation   ? followed by dr Lajuane Leatham---  last echo in epic 02-10-2021  moderate MR without stenosis  ? Osteoporosis   ? Wears dentures   ? full upper and lower partial  ? Wears hearing aid in left ear   ? ? ? ?No Known Allergies ? ? ?  Current Outpatient Medications  ?Medication Sig Dispense Refill  ? acetaminophen (TYLENOL) 500 MG tablet Take 500-1,000 mg by mouth every 6 (six) hours as needed for moderate pain.    ? alendronate (FOSAMAX) 70 MG tablet Take 70 mg by mouth once a week. Takes on Thursday's ;  Take with a full glass of water on an empty stomach.    ? apixaban (ELIQUIS) 5 MG TABS tablet Take 1 tablet (5 mg total) by mouth 2 (two) times daily. 60 tablet 0  ? Ascorbic Acid (VITAMIN C) 1000 MG tablet Take 1,000 mg by mouth 2 (two) times daily.    ? bacitracin-neomycin-polymyxin-hydrocortisone (CORTISPORIN) 1 % ointment Apply 1 application  topically 2 (two) times daily. 15 g 3  ? calcium-vitamin D (OSCAL WITH D) 500-200 MG-UNIT tablet Take 1 tablet by mouth 2 (two) times daily.    ? furosemide (LASIX) 80 MG tablet Take 1 tablet (80 mg total) by mouth daily. 90 tablet 3  ? loperamide (IMODIUM A-D) 2 MG tablet Take 2 mg by mouth 4 (four) times daily as needed for diarrhea or loose stools.    ? losartan (COZAAR) 25 MG tablet Take 0.5 tablets (12.5 mg total) by mouth daily. 45 tablet 3  ? Magnesium 250 MG TABS Take 250 mg by mouth daily.    ? metoprolol succinate (TOPROL-XL) 100 MG 24 hr tablet Take 1 tablet (100 mg total) by mouth daily. Take with or immediately following a meal. 90 tablet 3  ? Multiple Vitamins-Minerals (MULTIVITAMIN PO) Take 1 tablet by mouth daily.    ? omeprazole (PRILOSEC) 20 MG capsule Take 20 mg by mouth daily.    ? Polyethyl Glycol-Propyl Glycol (SYSTANE OP) Place 1 drop into both eyes daily as needed (dry eyes).    ? potassium chloride SA (KLOR-CON M) 20 MEQ tablet Take 1 tablet (20 mEq total) by mouth daily. 90 tablet 3  ? prochlorperazine (COMPAZINE) 10 MG tablet Take 1 tablet (10 mg total) by mouth every 6 (six) hours as needed for nausea or vomiting. 30 tablet 0  ? pseudoephedrine (SUDAFED) 30 MG tablet Take 30 mg by mouth daily as needed for congestion. (Patient not taking: Reported on 12/13/2021)    ? sildenafil (REVATIO) 20 MG tablet Take 20-60 mg by mouth daily as needed (erectile dysfunction).    ? tiZANidine (ZANAFLEX) 4 MG capsule Take 4 mg by mouth 2 (two) times daily as needed for muscle spasms. (Patient not taking: Reported on 12/13/2021)    ? ?No current facility-administered medications for this visit.  ? ? ? ?Past Surgical History:  ?Procedure Laterality Date  ? APPENDECTOMY    ? age 33  ? BROW LIFT AND BLEPHAROPLASTY Bilateral   ? APPROX. 2017  ? CATARACT EXTRACTION W/ INTRAOCULAR LENS IMPLANT Bilateral 2018  ? HEMORROIDECTOMY    ? 2010 [facility]  and 2018 @ [facility]  ? HYDROCELE EXCISION Right 08/13/2017  ? Procedure:  RIGHT HYDROCELECTOMY;  Surgeon: Cleon Gustin, MD;  Location: [facility];  Service: Urology;  Laterality: Right;  ? HYDROCELE EXCISION Right 11/12/2017  ? Procedure: RIGHT HYDROCELECTOMY;  Surgeon: Cleon Gustin, MD;  Location: [facility];  Service: Urology;  Laterality: Right;  ? INGUINAL HERNIA REPAIR Bilateral   ? APPROX.   [id number];   WITH UMBILICAL HERNIA AND EPIGRASTIC HERNIA REPAIRS  ? INGUINAL HERNIA REPAIR  09/11/2011  ? Procedure: HERNIA REPAIR INGUINAL ADULT;  Surgeon: Scherry Ran;  Location: [facility];  Service: General;  Laterality: Right;  Recurrent Right  Inguinal Hernia Repair  ? MANDIBLE RECONSTRUCTION    ? x2  last one 1980;   from [place]  ( has retained hardware)  ? MIDDLE EAR SURGERY Right 02/19/2015  ? by Dr Benjamine Mola ;  tympanomastoidectomy  ? ROTATOR CUFF REPAIR Left 03/31/2016  ? TRANSURETHRAL RESECTION OF BLADDER TUMOR N/A 06/17/2021  ? Procedure: TRANSURETHRAL RESECTION OF BLADDER TUMOR (TURBT) WITH POST OPERATIVE INSTILLATION OF GEMCITABINE;  Surgeon: Festus Aloe, MD;  Location: [facility];  Service: Urology;  Laterality: N/A;  ? TRANSURETHRAL RESECTION OF BLADDER TUMOR N/A 08/16/2021  ? Procedure: TRANSURETHRAL RESECTION OF BLADDER TUMOR (TURBT);  Surgeon: Festus Aloe, MD;  Location: [facility];  Service: Urology;  Laterality: N/A;  ? TYMPANOPLASTY Right 05/02/2016  ? Procedure: TYMPANOPLASTY;  Surgeon: Leta Baptist, MD;  Location: [place];  Service: ENT;  Laterality: Right;  ? UMBILICAL HERNIA REPAIR  2017  ? ? ? ?No Known Allergies ? ? ? ?Family History  ?Problem Relation Age of Onset  ? Aortic aneurysm Other   ?     Deceased  ? Cancer Other   ?     Deceased  ? Cardiomyopathy Other   ?     Alive  ? Anesthesia problems Neg Hx   ? Hypotension Neg Hx   ? Malignant hyperthermia Neg Hx   ? Pseudochol deficiency Neg Hx   ? ? ? ?Social History ?Mr. Eaddy reports that he quit smoking about 14 years ago. His smoking use included cigarettes. He has  a 21.00 pack-year smoking history. He quit smokeless tobacco use about 15 years ago. ?Mr. Henriques reports that he does not currently use alcohol. ? ? ?Review of Systems ?CONSTITUTIONAL: No weight loss, fev

## 2022-01-04 NOTE — Patient Instructions (Signed)
Medication Instructions:  ?INCREASE Toprol XL to 150 mg daily ? ?Labwork: ?none ? ?Testing/Procedures: ?none ? ?Follow-Up: ?1 week Nurse visit ? ?1 month JB ? ?Any Other Special Instructions Will Be Listed Below (If Applicable). ? ?If you need a refill on your cardiac medications before your next appointment, please call your pharmacy. ? ?

## 2022-01-11 ENCOUNTER — Ambulatory Visit: Payer: Medicare Other

## 2022-01-12 ENCOUNTER — Ambulatory Visit (INDEPENDENT_AMBULATORY_CARE_PROVIDER_SITE_OTHER): Payer: Medicare Other

## 2022-01-12 VITALS — BP 88/60 | HR 92

## 2022-01-12 DIAGNOSIS — I951 Orthostatic hypotension: Secondary | ICD-10-CM

## 2022-01-12 DIAGNOSIS — Z79899 Other long term (current) drug therapy: Secondary | ICD-10-CM

## 2022-01-12 DIAGNOSIS — I4819 Other persistent atrial fibrillation: Secondary | ICD-10-CM

## 2022-01-12 DIAGNOSIS — I5042 Chronic combined systolic (congestive) and diastolic (congestive) heart failure: Secondary | ICD-10-CM

## 2022-01-12 DIAGNOSIS — R0602 Shortness of breath: Secondary | ICD-10-CM

## 2022-01-12 DIAGNOSIS — R55 Syncope and collapse: Secondary | ICD-10-CM

## 2022-01-12 MED ORDER — FUROSEMIDE 40 MG PO TABS: ORAL_TABLET | ORAL | 3 refills | Status: DC

## 2022-01-12 MED ORDER — METOPROLOL SUCCINATE ER 100 MG PO TB24
100.0000 mg | ORAL_TABLET | Freq: Every day | ORAL | 3 refills | Status: DC
Start: 2022-01-12 — End: 2022-11-07

## 2022-01-12 NOTE — Progress Notes (Signed)
?Cardiology Office Note:   ? ?Date:  01/12/2022  ? ?ID:  Kenneth [name], DOB July 01, 1951, MRN [id number] ? ?PCP:  Glenda Chroman, MD ?  ?[facility] Providers ?Cardiologist:  Carlyle Dolly, MD { ? ?Referring MD: Glenda Chroman, MD  ? ? ?History of Present Illness:   ? ?Kenneth [name] is a 71 y.o. male with a hx of Afib not on Ac due to hematuria in the setting of bladder cancer as well as pancytopenia due to chemo, systolic HF with recent reduction in LVEF 55-60%>35-40%, moderate MR, COPD and bladder cancer who presented for a nurses visit today found to be hypotensive with significant lightheadedness and presyncope for which I was asked to evaluate. ? ?Briefly, the patient has been followed for Dr. Harl Bowie for Afib and HF. He has been having ongoing issues with rapid HR in Afib with 09/2021 monitor with 100% burden Afib with average HR 110-213bpm. Has severe LAE on TTE and therefore rhythm control has not been pursued. Was previously on apixaban but this was stopped due to recurrent hematuria and pancytopenia in the setting of bladder cancer and chemotherapy treatment. He also has a newly reduced LVEF from 55-60% in 02/2021 to 35-40% in 11/2021 thought to possibly be due to tachy induced CM. Also with moderate RV dysfunction, severe BAE, mild MR and moderate to severe MR. Was last seen by Dr. Harl Bowie on 01/04/22 where he was more SOB with worsening LE edema, orthopnea and weight gain. He is currently on lasix '80mg'$  daily which has controlled symptoms. Metop was increased to '150mg'$  XL daily to help control HR.  ? ?During nurses visit today, the patient was hypotensive with SBP 88 and 90 with associated lightheadedness and presyncopal symptoms. States he is unable to care for himself due to the feelings that he is going to pass out. Symptoms are worsened with standing and improved with sitting down. No chest pain. LE edema is trivial in the RLE. No orthopnea or PND.  ? ?Past Medical History:  ?Diagnosis Date  ? Anticoagulant  long-term use   ? eliquis--- managed by cardiology  ? Arthritis   ? osteoporosis  ? Atrial fibrillation, unspecified type ([place]) 2018  ? cardiologist--- dr j. branch  ? Bladder neoplasm   ? Bradycardia   ? COPD (chronic obstructive pulmonary disease) ([place])   ? Gallbladder polyp   ? followed by surgeon, dr r. cathy (lov note in epic 06-27-2021 stated surgical management after pt has completed bladder cancer treatment  ? GERD (gastroesophageal reflux disease)   ? Heart murmur   ? History of drug abuse in remission [facility])   ? per pt in remission since 02-26-1999  ? History of hepatitis C 12/2020  ? followed by dr d. Montez Morita (aph/ [facility] and hepatology);  dx 03/ 2022, liver bx 01-12-2021 g2hepatitis, f2;  completed 12 wks treatment 04-14-2021 w/ epclusa, hcv undetectable 04-21-2021  ? Mitral valve regurgitation   ? followed by dr branch---  last echo in epic 02-10-2021  moderate MR without stenosis  ? Osteoporosis   ? Wears dentures   ? full upper and lower partial  ? Wears hearing aid in left ear   ? ? ?Past Surgical History:  ?Procedure Laterality Date  ? APPENDECTOMY    ? age 71  ? BROW LIFT AND BLEPHAROPLASTY Bilateral   ? APPROX. 2017  ? CATARACT EXTRACTION W/ INTRAOCULAR LENS IMPLANT Bilateral 2018  ? HEMORROIDECTOMY    ? 2010 [facility]  and 2018 @  [facility]  ? HYDROCELE EXCISION Right 08/13/2017  ? Procedure: RIGHT HYDROCELECTOMY;  Surgeon: Cleon Gustin, MD;  Location: [facility];  Service: Urology;  Laterality: Right;  ? HYDROCELE EXCISION Right 11/12/2017  ? Procedure: RIGHT HYDROCELECTOMY;  Surgeon: Cleon Gustin, MD;  Location: [facility];  Service: Urology;  Laterality: Right;  ? INGUINAL HERNIA REPAIR Bilateral   ? APPROX.   [id number];   WITH UMBILICAL HERNIA AND EPIGRASTIC HERNIA REPAIRS  ? INGUINAL HERNIA REPAIR  09/11/2011  ? Procedure: HERNIA REPAIR INGUINAL ADULT;  Surgeon: Scherry Ran;  Location: [facility];  Service: General;  Laterality: Right;  Recurrent Right Inguinal Hernia Repair  ?  MANDIBLE RECONSTRUCTION    ? x2  last one 1980;   from [place]  ( has retained hardware)  ? MIDDLE EAR SURGERY Right 02/19/2015  ? by Dr Benjamine Mola ;  tympanomastoidectomy  ? ROTATOR CUFF REPAIR Left 03/31/2016  ? TRANSURETHRAL RESECTION OF BLADDER TUMOR N/A 06/17/2021  ? Procedure: TRANSURETHRAL RESECTION OF BLADDER TUMOR (TURBT) WITH POST OPERATIVE INSTILLATION OF GEMCITABINE;  Surgeon: Festus Aloe, MD;  Location[facility];  Service: Urology;  Laterality: N/A;  ? TRANSURETHRAL RESECTION OF BLADDER TUMOR N/A 08/16/2021  ? Procedure: TRANSURETHRAL RESECTION OF BLADDER TUMOR (TURBT);  Surgeon: Festus Aloe, MD;  Location: [facility];  Service: Urology;  Laterality: N/A;  ? TYMPANOPLASTY Right 05/02/2016  ? Procedure: TYMPANOPLASTY;  Surgeon: Leta Baptist, MD;  Location: [place];  Service: ENT;  Laterality: Right;  ? UMBILICAL HERNIA REPAIR  2017  ? ? ?Current Medications: ?Current Meds  ?Medication Sig  ? furosemide (LASIX) 40 MG tablet Take 40 mg daily.May take an additional dose for led swelling or weight gain  ? metoprolol succinate (TOPROL-XL) 100 MG 24 hr tablet Take 1 tablet (100 mg total) by mouth daily. Take with or immediately following a meal.  ?  ? ?Allergies:   Patient has no known allergies.  ? ?Social History  ? ?Socioeconomic History  ? Marital status: Divorced  ?  Spouse name: Not on file  ? Number of children: Not on file  ? Years of education: Not on file  ? Highest education level: Not on file  ?Occupational History  ? Not on file  ?Tobacco Use  ? Smoking status: Former  ?  Packs/day: 1.00  ?  Years: 21.00  ?  Pack years: 21.00  ?  Types: Cigarettes  ?  Quit date: 09/08/2007  ?  Years since quitting: 14.3  ? Smokeless tobacco: Former  ?  Quit date: 2008  ?Vaping Use  ? Vaping Use: Never used  ?Substance and Sexual Activity  ? Alcohol use: Not Currently  ?  Comment: quit 02-26-1999  ? Drug use: Not Currently  ?  Comment: per pt quit 02-26-1999 in  remission (-cocaine,Crack,heroin)  ? Sexual activity: Yes  ?  Birth control/protection: None  ?Other Topics Concern  ? Not on file  ?Social History Narrative  ? Not on file  ? ?Social Determinants of Health  ? ?Financial Resource Strain: Not on file  ?Food Insecurity: Not on file  ?Transportation Needs: Not on file  ?Physical Activity: Not on file  ?Stress: Not on file  ?Social Connections: Not on file  ?  ? ?Family History: ?The patient's family history includes Aortic aneurysm in an other family member; Cancer in an other family member; Cardiomyopathy in an other family member. There is no history of Anesthesia problems, Hypotension, Malignant hyperthermia, or Pseudochol  deficiency. ? ?ROS:   ?Please see the history of present illness.    ?Review of Systems  ?Constitutional:  Positive for malaise/fatigue.  ?Respiratory:  Positive for shortness of breath.   ?Cardiovascular:  Positive for leg swelling. Negative for chest pain, palpitations, orthopnea, claudication and PND.  ?Musculoskeletal:  Positive for myalgias.  ?Neurological:  Positive for dizziness. Negative for loss of consciousness.   ? ?EKGs/Labs/Other Studies Reviewed:   ? ?The following studies were reviewed today: ? ?TTE 11/2021: ?IMPRESSIONS  ? 1. Left ventricular ejection fraction, by estimation, is 35 to 40%. The  ?left ventricle has moderately decreased function. The left ventricle  ?demonstrates global hypokinesis. There is moderate left ventricular  ?hypertrophy. Left ventricular diastolic  ?parameters are indeterminate.  ? 2. Right ventricular systolic function is moderately reduced. The right  ?ventricular size is severely enlarged. There is mildly elevated pulmonary  ?artery systolic pressure.  ? 3. Left atrial size was severely dilated.  ? 4. Right atrial size was severely dilated.  ? 5. The mitral valve is normal in structure. Mild mitral valve  ?regurgitation. No evidence of mitral stenosis.  ? 6. The tricuspid valve is abnormal. Tricuspid  valve regurgitation is  ?moderate to severe.  ? 7. The aortic valve is tricuspid. Aortic valve regurgitation is not  ?visualized. No aortic stenosis is present.  ? 8. The inferior vena cava is dilated in size w

## 2022-01-12 NOTE — Addendum Note (Signed)
Addended by: Gwyndolyn Kaufman on: 01/12/2022 05:50 PM ? ? Modules accepted: Level of Service ? ?

## 2022-01-12 NOTE — Patient Instructions (Addendum)
Medication Instructions:  ?STOP losartan ? ? ?DECREASE Toprol to 100 mg daily ? ? ?DECREASE lasix to 40 mg daily-may take additional dose for leg swelling or weight gain ? ?Labwork: ? ?BMET,BNP in 1 week at [facility] ? ?Testing/Procedures: ?None today ? ?Follow-Up: ?Keep 01/26/22 apt with Dr.Branch at 2 pm in [place] ? ?Any Other Special Instructions Will Be Listed Below (If Applicable). ? ?If you need a refill on your cardiac medications before your next appointment, please call your pharmacy. ? ?

## 2022-01-12 NOTE — Progress Notes (Signed)
Seen by Dr.Branch on 01/04/22.Toprol increased from 100 mg to 150 mg qd. He also restarted Lasix 80 mg daily.Patient here today for VS/EKG. ? ?EKG A-fib with HR 92, BP was 88/60 and 90/64 ? ? ?Patient states he can barely walk to mail box without getting dizzy. ? ? ?Dr.Pemberton ,DOD, will evaluate patient. ?

## 2022-01-23 ENCOUNTER — Ambulatory Visit (HOSPITAL_COMMUNITY)
Admission: RE | Admit: 2022-01-23 | Discharge: 2022-01-23 | Disposition: A | Discharge status: Home / Self Care | Payer: Medicare Other | Source: Ambulatory Visit | Attending: Physician Assistant | Admitting: Physician Assistant

## 2022-01-23 ENCOUNTER — Other Ambulatory Visit (HOSPITAL_COMMUNITY)
Admission: RE | Admit: 2022-01-23 | Discharge: 2022-01-23 | Disposition: A | Discharge status: Home / Self Care | Payer: Medicare Other | Source: Ambulatory Visit | Attending: Cardiology | Admitting: Cardiology

## 2022-01-23 ENCOUNTER — Ambulatory Visit (HOSPITAL_COMMUNITY): Admission: RE | Admit: 2022-01-23 | Payer: Medicare Other | Source: Ambulatory Visit

## 2022-01-23 DIAGNOSIS — J439 Emphysema, unspecified: Secondary | ICD-10-CM | POA: Insufficient documentation

## 2022-01-23 DIAGNOSIS — R0602 Shortness of breath: Secondary | ICD-10-CM | POA: Insufficient documentation

## 2022-01-23 DIAGNOSIS — Z87891 Personal history of nicotine dependence: Secondary | ICD-10-CM | POA: Diagnosis not present

## 2022-01-23 DIAGNOSIS — I2510 Atherosclerotic heart disease of native coronary artery without angina pectoris: Secondary | ICD-10-CM | POA: Diagnosis not present

## 2022-01-23 DIAGNOSIS — Z122 Encounter for screening for malignant neoplasm of respiratory organs: Secondary | ICD-10-CM | POA: Diagnosis not present

## 2022-01-23 DIAGNOSIS — Z79899 Other long term (current) drug therapy: Secondary | ICD-10-CM | POA: Diagnosis not present

## 2022-01-23 DIAGNOSIS — I700 Atherosclerosis of aorta: Secondary | ICD-10-CM | POA: Insufficient documentation

## 2022-01-23 LAB — BASIC METABOLIC PANEL
Anion gap: 8 (ref 5–15)
BUN: 23 mg/dL (ref 8–23)
CO2: 30 mmol/L (ref 22–32)
Calcium: 9.2 mg/dL (ref 8.9–10.3)
Chloride: 105 mmol/L (ref 98–111)
Creatinine, Ser: 0.97 mg/dL (ref 0.61–1.24)
GFR, Estimated: >60 mL/min (ref >60)
Glucose, Bld: 91 mg/dL (ref 70–99)
Potassium: 4.3 mmol/L (ref 3.5–5.1)
Sodium: 143 mmol/L (ref 135–145)

## 2022-01-23 LAB — BRAIN NATRIURETIC PEPTIDE: B Natriuretic Peptide: 506 pg/mL — ABNORMAL HIGH (ref 0.0–100.0)

## 2022-01-23 NOTE — Addendum Note (Signed)
Addended by: Barbarann Ehlers A on: 01/23/2022 09:02 AM ? ? Modules accepted: Orders ? ?

## 2022-01-26 ENCOUNTER — Telehealth: Payer: Self-pay

## 2022-01-26 ENCOUNTER — Ambulatory Visit (INDEPENDENT_AMBULATORY_CARE_PROVIDER_SITE_OTHER): Payer: Medicare Other | Admitting: Cardiology

## 2022-01-26 ENCOUNTER — Encounter: Payer: Self-pay | Admitting: Cardiology

## 2022-01-26 VITALS — BP 110/64 | HR 98 | Ht 72.0 in | Wt 181.2 lb

## 2022-01-26 DIAGNOSIS — I5022 Chronic systolic (congestive) heart failure: Secondary | ICD-10-CM

## 2022-01-26 DIAGNOSIS — J439 Emphysema, unspecified: Secondary | ICD-10-CM

## 2022-01-26 DIAGNOSIS — I4819 Other persistent atrial fibrillation: Secondary | ICD-10-CM

## 2022-01-26 MED ORDER — ATORVASTATIN CALCIUM 40 MG PO TABS: 40.0000 mg | ORAL_TABLET | Freq: Every day | ORAL | 3 refills | Status: DC

## 2022-01-26 NOTE — Telephone Encounter (Signed)
-----   Message from Imogene Burn, PA-C sent at 01/24/2022  3:34 PM EDT ----- ?Chest CT no lung cancer, small nodules recommend repeat CT in 1 yr. Aortic and coronary calcification on CT-should be on a statin-begin lipitor 40 mg once daily. Check FLP in 3 months. and Emphysema-refer to pulmonary. thanks ?

## 2022-01-26 NOTE — Patient Instructions (Signed)
Follow-Up: ?Follow up with Dr. Harl Bowie in 2 months.  ? ?Any Other Special Instructions Will Be Listed Below (If Applicable). ?You have been referred to EP for Atrial Fibrillation.  ? ? ? ?If you need a refill on your cardiac medications before your next appointment, please call your pharmacy. ? ?

## 2022-01-26 NOTE — Progress Notes (Signed)
? ? ? ?Clinical Summary ?Kenneth [name] is a 71 y.o.male seen today for follow up of the following medical problems.  ?  ?1. Afib ?09/2021 monitor: 100% afib, Patient was in afib throughout the study, range 64-213 bpm, average HR 110 ?-prevoiusly off elqiuis due to hematuria and pancytopenia related to bladder cancer and chemo. Most recently has been on anticoag and tolearing.   ?- very severe left atrial enlargement, LAVI 63. Had not been able to be on eliquis ppveriously Have not pursued trying to reestablish SR.  ?  ?- seen 01/12/22 by Dr Johney Frame ?- low bps 80s/60s, presyncopal ?- toprol lowered to '100mg'$  daily, losartan held, lasix decreased to '40mg'$  daily ?- bp's improved today 110/64 ?- remains on eliquis, no recent hematuria.  ? ?  ?2. 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  ?02/2021 echo LVEF 55-60%, no WMAs, indet diastolic fxn, mild to mod MR, mild to mod TR ?11/22/21 LVEF 35-40%, mod RV dysfunction, severe BAE, mild MR, mod to severe TR ?  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 Working on rate control ?  ?12/13/2021 CXR no acute process ?12/13/2021 K 3.2 Cr 0.82 BUN 16 ?-leg swelling resolved at one point. He had stopped his lasix when swelling resolved, has had some recurrence ?- 01/23/22 labs: K 4.3 Cr 0.97 BUN 23 BNP 506 ?  ?- no recent edmea. Taking '40mg'$  daily ?- home scale 174 lbs.  ?  ?  ?3.Bladder cancer ?- followed by oncology and urology ?- undergoing chemo and radiation, along with urology sTURBT ? Oct 14 2021 onc note ?"he continues to receive definitive therapy with radiation and weekly cisplatin.  Chemotherapy was withheld on October 06, 2021 due to cytopenia" ?  ?- from note has compelted current treatment course, now under observation ?  ? ?4. Lung cancer screening ?- atherosclerosis and coronary atherosclerosis noted ? ?5. Gallbladder polyp ?- on hold chole given  bladder cancer.  ?  ? AAA screen ?Male over 19 with tobacco history. 09/2011 abdominal US without aneusyrm.  ?  ?  ?  ? ?Past Medical History:  ?Diagnosis Date  ? Anticoagulant long-term use   ? eliquis--- managed by cardiology  ? Arthritis   ? osteoporosis  ? Atrial fibrillation, unspecified type ([place]) 2018  ? cardiologist--- dr j. Denell Cothern  ? Bladder neoplasm   ? Bradycardia   ? COPD (chronic obstructive pulmonary disease) ([place])   ? Gallbladder polyp   ? followed by surgeon, dr r. cathy (lov note in epic 06-27-2021 stated surgical management after pt has completed bladder cancer treatment  ? GERD (gastroesophageal reflux disease)   ? Heart murmur   ? History of drug abuse in remission [facility])   ? per pt in remission since 02-26-1999  ? History of hepatitis C 12/2020  ? followed by dr d. Montez Morita (aph/ [facility]);  dx 03/ 2022, liver bx 01-12-2021 g2hepatitis, f2;  completed 12 wks treatment 04-14-2021 w/ epclusa, hcv undetectable 04-21-2021  ? Mitral valve regurgitation   ? followed by dr Singleton Hickox---  last echo in epic 02-10-2021  moderate MR without stenosis  ? Osteoporosis   ? Wears dentures   ? full upper and lower partial  ? Wears hearing aid in left ear   ? ? ? ?No Known Allergies ? ? ?Current Outpatient Medications  ?Medication Sig Dispense Refill  ?  acetaminophen (TYLENOL) 500 MG tablet Take 500-1,000 mg by mouth every 6 (six) hours as needed for moderate pain.    ? alendronate (FOSAMAX) 70 MG tablet Take 70 mg by mouth once a week. Takes on Thursday's ;  Take with a full glass of water on an empty stomach.    ? apixaban (ELIQUIS) 5 MG TABS tablet Take 1 tablet (5 mg total) by mouth 2 (two) times daily. 60 tablet 0  ? Ascorbic Acid (VITAMIN C) 1000 MG tablet Take 1,000 mg by mouth 2 (two) times daily.    ? atorvastatin (LIPITOR) 40 MG tablet Take 1 tablet (40 mg total) by mouth daily. 90 tablet 3  ? bacitracin-neomycin-polymyxin-hydrocortisone (CORTISPORIN) 1 % ointment Apply 1  application topically 2 (two) times daily. 15 g 3  ? calcium-vitamin D (OSCAL WITH D) 500-200 MG-UNIT tablet Take 1 tablet by mouth 2 (two) times daily.    ? furosemide (LASIX) 40 MG tablet Take 40 mg daily.May take an additional dose for led swelling or weight gain 90 tablet 3  ? loperamide (IMODIUM A-D) 2 MG tablet Take 2 mg by mouth 4 (four) times daily as needed for diarrhea or loose stools.    ? Magnesium 250 MG TABS Take 250 mg by mouth daily.    ? metoprolol succinate (TOPROL-XL) 100 MG 24 hr tablet Take 1 tablet (100 mg total) by mouth daily. Take with or immediately following a meal. 90 tablet 3  ? Multiple Vitamins-Minerals (MULTIVITAMIN PO) Take 1 tablet by mouth daily.    ? omeprazole (PRILOSEC) 20 MG capsule Take 20 mg by mouth daily.    ? Polyethyl Glycol-Propyl Glycol (SYSTANE OP) Place 1 drop into both eyes daily as needed (dry eyes).    ? potassium chloride SA (KLOR-CON M) 20 MEQ tablet Take 1 tablet (20 mEq total) by mouth daily. 90 tablet 3  ? prochlorperazine (COMPAZINE) 10 MG tablet Take 1 tablet (10 mg total) by mouth every 6 (six) hours as needed for nausea or vomiting. (Patient not taking: Reported on 01/04/2022) 30 tablet 0  ? pseudoephedrine (SUDAFED) 30 MG tablet Take 30 mg by mouth daily as needed for congestion. (Patient not taking: Reported on 12/13/2021)    ? sildenafil (REVATIO) 20 MG tablet Take 20-60 mg by mouth daily as needed (erectile dysfunction). (Patient not taking: Reported on 01/04/2022)    ? tiZANidine (ZANAFLEX) 4 MG capsule Take 4 mg by mouth 2 (two) times daily as needed for muscle spasms. (Patient not taking: Reported on 12/13/2021)    ? ?No current facility-administered medications for this visit.  ? ? ? ?Past Surgical History:  ?Procedure Laterality Date  ? APPENDECTOMY    ? age 73  ? BROW LIFT AND BLEPHAROPLASTY Bilateral   ? APPROX. 2017  ? CATARACT EXTRACTION W/ INTRAOCULAR LENS IMPLANT Bilateral 2018  ? HEMORROIDECTOMY    ? 2010 [facility]  and 2018 @ [facility]  ? HYDROCELE  EXCISION Right 08/13/2017  ? Procedure: RIGHT HYDROCELECTOMY;  Surgeon: Cleon Gustin, MD;  Location: [facility];  Service: Urology;  Laterality: Right;  ? HYDROCELE EXCISION Right 11/12/2017  ? Procedure: RIGHT HYDROCELECTOMY;  Surgeon: Cleon Gustin, MD;  Location: [facility];  Service: Urology;  Laterality: Right;  ? INGUINAL HERNIA REPAIR Bilateral   ? APPROX.   1027;   WITH UMBILICAL HERNIA AND EPIGRASTIC HERNIA REPAIRS  ? INGUINAL HERNIA REPAIR  09/11/2011  ? Procedure: HERNIA REPAIR INGUINAL ADULT;  Surgeon: Scherry Ran;  Location: [facility];  Service:  General;  Laterality: Right;  Recurrent Right Inguinal Hernia Repair  ? MANDIBLE RECONSTRUCTION    ? x2  last one 1980;   from [place]  ( has retained hardware)  ? MIDDLE EAR SURGERY Right 02/19/2015  ? by Dr Benjamine Mola ;  tympanomastoidectomy  ? ROTATOR CUFF REPAIR Left 03/31/2016  ? TRANSURETHRAL RESECTION OF BLADDER TUMOR N/A 06/17/2021  ? Procedure: TRANSURETHRAL RESECTION OF BLADDER TUMOR (TURBT) WITH POST OPERATIVE INSTILLATION OF GEMCITABINE;  Surgeon: Festus Aloe, MD;  [facility];  Service: Urology;  Laterality: N/A;  ? TRANSURETHRAL RESECTION OF BLADDER TUMOR N/A 08/16/2021  ? Procedure: TRANSURETHRAL RESECTION OF BLADDER TUMOR (TURBT);  Surgeon: Festus Aloe, MD;  Location: [facility];  Service: Urology;  Laterality: N/A;  ? TYMPANOPLASTY Right 05/02/2016  ? Procedure: TYMPANOPLASTY;  Surgeon: Leta Baptist, MD;  Location: [place];  Service: ENT;  Laterality: Right;  ? UMBILICAL HERNIA REPAIR  2017  ? ? ? ?No Known Allergies ? ? ? ?Family History  ?Problem Relation Age of Onset  ? Aortic aneurysm Other   ?     Deceased  ? Cancer Other   ?     Deceased  ? Cardiomyopathy Other   ?     Alive  ? Anesthesia problems Neg Hx   ? Hypotension Neg Hx   ? Malignant hyperthermia Neg Hx   ? Pseudochol deficiency Neg Hx   ? ? ? ?Social History ?Mr. Romulus reports that he quit smoking about 14 years ago. His  smoking use included cigarettes. He has a 21.00 pack-year smoking history. He quit smokeless tobacco use about 15 years ago. ?Mr. Searfoss reports that he does not currently use alcohol. ? ? ?Review of Systems ?CONSTIT

## 2022-01-30 ENCOUNTER — Telehealth: Payer: Self-pay | Admitting: Oncology

## 2022-01-30 NOTE — Telephone Encounter (Signed)
.  Called patient to schedule appointment per 4/21 inbasket, patient is aware of date and time.   ?

## 2022-02-01 DIAGNOSIS — Z299 Encounter for prophylactic measures, unspecified: Secondary | ICD-10-CM | POA: Diagnosis not present

## 2022-02-01 DIAGNOSIS — M25511 Pain in right shoulder: Secondary | ICD-10-CM | POA: Diagnosis not present

## 2022-02-01 DIAGNOSIS — I10 Essential (primary) hypertension: Secondary | ICD-10-CM | POA: Diagnosis not present

## 2022-02-01 DIAGNOSIS — Z789 Other specified health status: Secondary | ICD-10-CM | POA: Diagnosis not present

## 2022-02-15 DIAGNOSIS — R5383 Other fatigue: Secondary | ICD-10-CM | POA: Diagnosis not present

## 2022-02-15 DIAGNOSIS — Z6824 Body mass index (BMI) 24.0-24.9, adult: Secondary | ICD-10-CM | POA: Diagnosis not present

## 2022-02-15 DIAGNOSIS — Z299 Encounter for prophylactic measures, unspecified: Secondary | ICD-10-CM | POA: Diagnosis not present

## 2022-02-15 DIAGNOSIS — I10 Essential (primary) hypertension: Secondary | ICD-10-CM | POA: Diagnosis not present

## 2022-02-15 DIAGNOSIS — E7800 Pure hypercholesterolemia, unspecified: Secondary | ICD-10-CM | POA: Diagnosis not present

## 2022-02-15 DIAGNOSIS — Z0000 Encounter for general adult medical examination without abnormal findings: Secondary | ICD-10-CM | POA: Diagnosis not present

## 2022-02-15 DIAGNOSIS — Z1331 Encounter for screening for depression: Secondary | ICD-10-CM | POA: Diagnosis not present

## 2022-02-15 DIAGNOSIS — Z1339 Encounter for screening examination for other mental health and behavioral disorders: Secondary | ICD-10-CM | POA: Diagnosis not present

## 2022-02-15 DIAGNOSIS — Z7189 Other specified counseling: Secondary | ICD-10-CM | POA: Diagnosis not present

## 2022-02-15 DIAGNOSIS — Z79899 Other long term (current) drug therapy: Secondary | ICD-10-CM | POA: Diagnosis not present

## 2022-02-15 DIAGNOSIS — Z1211 Encounter for screening for malignant neoplasm of colon: Secondary | ICD-10-CM | POA: Diagnosis not present

## 2022-02-16 DIAGNOSIS — E7800 Pure hypercholesterolemia, unspecified: Secondary | ICD-10-CM | POA: Diagnosis not present

## 2022-02-16 DIAGNOSIS — R5383 Other fatigue: Secondary | ICD-10-CM | POA: Diagnosis not present

## 2022-02-16 DIAGNOSIS — Z79899 Other long term (current) drug therapy: Secondary | ICD-10-CM | POA: Diagnosis not present

## 2022-02-16 DIAGNOSIS — Z125 Encounter for screening for malignant neoplasm of prostate: Secondary | ICD-10-CM | POA: Diagnosis not present

## 2022-02-28 ENCOUNTER — Ambulatory Visit (INDEPENDENT_AMBULATORY_CARE_PROVIDER_SITE_OTHER): Payer: Medicare Other | Admitting: Internal Medicine

## 2022-02-28 ENCOUNTER — Encounter: Payer: Self-pay | Admitting: Internal Medicine

## 2022-02-28 VITALS — BP 114/72 | HR 69 | Ht 72.0 in | Wt 183.0 lb

## 2022-02-28 DIAGNOSIS — Z79899 Other long term (current) drug therapy: Secondary | ICD-10-CM | POA: Diagnosis not present

## 2022-02-28 DIAGNOSIS — I4819 Other persistent atrial fibrillation: Secondary | ICD-10-CM

## 2022-02-28 DIAGNOSIS — Z5181 Encounter for therapeutic drug level monitoring: Secondary | ICD-10-CM | POA: Diagnosis not present

## 2022-02-28 MED ORDER — DIGOXIN 125 MCG PO TABS: 0.1250 mg | ORAL_TABLET | Freq: Every day | ORAL | 3 refills | Status: DC

## 2022-02-28 NOTE — Patient Instructions (Addendum)
Medication Instructions:  Your physician has recommended you make the following change in your medication:    START taking digoxin 0.125 mg-  Take one tablet by mouth daily.   Labwork: You will get lab work at [facility] lab on any day between March 21, 2022 and March 28, 2022.  (Digoxin level)  Testing/Procedures: None ordered.  Follow-Up: Your physician wants you to follow-up in: 6 weeks with Kenneth Peru, MD in [place]  Any Other Special Instructions Will Be Listed Below (If Applicable).  If you need a refill on your cardiac medications before your next appointment, please call your pharmacy.   Important Information About Sugar      Digoxin Tablets What is this medication? DIGOXIN (di JOX in) treats heart failure. It may also be used to treat a type of arrhythmia known as AFib (atrial fibrillation). It works by helping your heart beat stronger, making it easier for your heart to pump blood to the rest of the body. It also slows down overactive electric signals in the heart, which stabilizes your heart rhythm. This medicine may be used for other purposes; ask your health care provider or pharmacist if you have questions. COMMON BRAND NAME(S): Digitek, Lanoxicaps, Lanoxin What should I tell my care team before I take this medication? They need to know if you have any of these conditions: Certain heart rhythm disorders Heart disease or recent heart attack Kidney or liver disease An unusual or allergic reaction to digoxin, other medications, foods, dyes, or preservatives Pregnant or trying to get pregnant Breast-feeding How should I use this medication? Take this medication by mouth with a glass of water. Follow the directions on the prescription label. Take your doses at regular intervals. Do not take your medication more often than directed. Talk to your care team about the use of this medication in children. Special care may be needed. Overdosage: If you think you  have taken too much of this medicine contact a poison control center or emergency room at once. NOTE: This medicine is only for you. Do not share this medicine with others. What if I miss a dose? If you miss a dose, take it as soon as you can. If it is almost time for your next dose, take only that dose. Do not take double or extra doses. What may interact with this medication? Activated charcoal Albuterol Alprazolam Antacids Antiviral medications for HIV or AIDS like ritonavir and saquinavir Calcium Certain antibiotics like azithromycin, clarithromycin, erythromycin, gentamicin, neomycin, trimethoprim, and tetracycline Certain medications for blood pressure, heart disease, irregular heart beat Certain medications for cancer Certain medications for cholesterol like atorvastatin, cholestyramine, and colestipol Certain medications for diabetes, like acarbose, exenatide, miglitol, and metformin Certain medications for fungal infections like ketoconazole and itraconazole Certain medications for stomach problems like omeprazole, esomeprazole, lansoprazole, rabeprazole, metoclopramide, and sucralfate Conivaptan Cyclosporine Diphenoxylate Epinephrine Kaolin; pectin Nefazodone NSAIDS, medications for pain and inflammation, like celecoxib, ibuprofen, or naproxen Penicillamine Phenytoin Propantheline Quinine Phenytoin Rifampin Succinylcholine St. John's Wort Sulfasalazine Teriparatide Thyroid hormones Tolvaptan This list may not describe all possible interactions. Give your health care provider a list of all the medicines, herbs, non-prescription drugs, or dietary supplements you use. Also tell them if you smoke, drink alcohol, or use illegal drugs. Some items may interact with your medicine. What should I watch for while using this medication? Visit your care team for regular checks on your progress. Do not stop taking this medication without the advice of your care team, even if you  feel better. Do not change the brand you are taking, other brands may affect you differently. Check your heart rate and blood pressure regularly while you are taking this medication. Ask your care team what your heart rate and blood pressure should be, and when you should contact him or her. Your care team also may schedule regular blood tests and electrocardiograms to check your progress. Watch your diet. Less digoxin may be absorbed from the stomach if you have a diet high in bran fiber. Do not treat yourself for coughs, colds or allergies without asking your care team for advice. Some ingredients can increase possible side effects. What side effects may I notice from receiving this medication? Side effects that you should report to your care team as soon as possible: Allergic reactions--skin rash, itching, hives, swelling of the face, lips, tongue, or throat Digoxin toxicity--confusion, loss of appetite, nausea, vomiting, diarrhea, change in vision such as blurry or yellow vision, fatigue, fast or irregular heartbeat Slow heartbeat--dizziness, feeling faint or lightheaded, confusion, trouble breathing, unusual weakness or fatigue Side effects that usually do not require medical attention (report to your care team if they continue or are bothersome): Dizziness Stomach pain Unexpected breast tissue growth This list may not describe all possible side effects. Call your doctor for medical advice about side effects. You may report side effects to FDA at 1-800-FDA-1088. Where should I keep my medication? Keep out of the reach of children. Store at room temperature between 15 and 30 degrees C (59 and 86 degrees F). Protect from light and moisture. Throw away any unused medication after the expiration date. NOTE: This sheet is a summary. It may not cover all possible information. If you have questions about this medicine, talk to your doctor, pharmacist, or health care provider.  2023 Elsevier/Gold  Standard (2021-01-02 00:00:00)

## 2022-02-28 NOTE — Progress Notes (Signed)
HPI Kenneth [name] is referred by Dr. Harl Bowie for evaluation of atrial fib. He is a pleasant 71 yo man with longstanding atrial fib, and recent development of a tachy induced CM. His atrial fib dates back at least 5 years. His rates have increased and a heart monitor obtained in December demonstated an ave HR of 145. He has not had syncope. He has been treated with chemo and XRT for bladder CA.   No Known Allergies   Current Outpatient Medications  Medication Sig Dispense Refill   acetaminophen (TYLENOL) 500 MG tablet Take 500-1,000 mg by mouth every 6 (six) hours as needed for moderate pain.     alendronate (FOSAMAX) 70 MG tablet Take 70 mg by mouth once a week. Takes on Thursday's ;  Take with a full glass of water on an empty stomach.     apixaban (ELIQUIS) 5 MG TABS tablet Take 1 tablet (5 mg total) by mouth 2 (two) times daily. 60 tablet 0   Ascorbic Acid (VITAMIN C) 1000 MG tablet Take 1,000 mg by mouth 2 (two) times daily.     atorvastatin (LIPITOR) 40 MG tablet Take 1 tablet (40 mg total) by mouth daily. 90 tablet 3   bacitracin-neomycin-polymyxin-hydrocortisone (CORTISPORIN) 1 % ointment Apply 1 application topically 2 (two) times daily. 15 g 3   calcium-vitamin D (OSCAL WITH D) 500-200 MG-UNIT tablet Take 1 tablet by mouth 2 (two) times daily.     digoxin (LANOXIN) 0.125 MG tablet Take 1 tablet (0.125 mg total) by mouth daily. 90 tablet 3   furosemide (LASIX) 40 MG tablet Take 40 mg daily.May take an additional dose for led swelling or weight gain 90 tablet 3   loperamide (IMODIUM A-D) 2 MG tablet Take 2 mg by mouth 4 (four) times daily as needed for diarrhea or loose stools.     Magnesium 250 MG TABS Take 250 mg by mouth daily.     metoprolol succinate (TOPROL-XL) 100 MG 24 hr tablet Take 1 tablet (100 mg total) by mouth daily. Take with or immediately following a meal. 90 tablet 3   Multiple Vitamins-Minerals (MULTIVITAMIN PO) Take 1 tablet by mouth daily.     omeprazole  (PRILOSEC) 20 MG capsule Take 20 mg by mouth daily.     Polyethyl Glycol-Propyl Glycol (SYSTANE OP) Place 1 drop into both eyes daily as needed (dry eyes).     potassium chloride SA (KLOR-CON M) 20 MEQ tablet Take 1 tablet (20 mEq total) by mouth daily. 90 tablet 3   prochlorperazine (COMPAZINE) 10 MG tablet Take 1 tablet (10 mg total) by mouth every 6 (six) hours as needed for nausea or vomiting. 30 tablet 0   pseudoephedrine (SUDAFED) 30 MG tablet Take 30 mg by mouth daily as needed for congestion.     sildenafil (REVATIO) 20 MG tablet Take 20-60 mg by mouth daily as needed (erectile dysfunction).     tiZANidine (ZANAFLEX) 4 MG capsule Take 4 mg by mouth 2 (two) times daily as needed for muscle spasms.     No current facility-administered medications for this visit.     Past Medical History:  Diagnosis Date   Anticoagulant long-term use    eliquis--- managed by cardiology   Arthritis    osteoporosis   Atrial fibrillation, unspecified type [facility]) 2018   cardiologist--- dr j. branch   Bladder neoplasm    Bradycardia    COPD (chronic obstructive pulmonary disease) ([place])    Gallbladder polyp  followed by surgeon, dr r. cathy (lov note in epic 06-27-2021 stated surgical management after pt has completed bladder cancer treatment   GERD (gastroesophageal reflux disease)    Heart murmur    History of drug abuse in remission ([place])    per pt in remission since 02-26-1999   History of hepatitis C 12/2020   followed by dr d. Montez Morita (aph/ [facility]);  dx 03/ 2022, liver bx 01-12-2021 g2hepatitis, f2;  completed 12 wks treatment 04-14-2021 w/ epclusa, hcv undetectable 04-21-2021   Mitral valve regurgitation    followed by dr branch---  last echo in epic 02-10-2021  moderate MR without stenosis   Osteoporosis    Wears dentures    full upper and lower partial   Wears hearing aid in left ear     ROS:   All systems reviewed and negative except as noted in the  HPI.   Past Surgical History:  Procedure Laterality Date   APPENDECTOMY     age 74   BROW LIFT AND BLEPHAROPLASTY Bilateral    APPROX. 2017   CATARACT EXTRACTION W/ INTRAOCULAR LENS IMPLANT Bilateral 2018   HEMORROIDECTOMY     2010 '@[facility]'$  and 2018 @ [place] Right 08/13/2017   Procedure: RIGHT HYDROCELECTOMY;  Surgeon: Cleon Gustin, MD;  Location: [facility];  Service: Urology;  Laterality: Right;   HYDROCELE EXCISION Right 11/12/2017   Procedure: RIGHT HYDROCELECTOMY;  Surgeon: Cleon Gustin, MD;  Location: [facility];  Service: Urology;  Laterality: Right;   INGUINAL HERNIA REPAIR Bilateral    APPROX.   4008;   WITH UMBILICAL HERNIA AND EPIGRASTIC HERNIA REPAIRS   INGUINAL HERNIA REPAIR  09/11/2011   Procedure: HERNIA REPAIR INGUINAL ADULT;  Surgeon: Scherry Ran;  Location: [facility];  Service: General;  Laterality: Right;  Recurrent Right Inguinal Hernia Repair   MANDIBLE RECONSTRUCTION     x2  last one 1980;   from [facility]  ( has retained hardware)   MIDDLE EAR SURGERY Right 02/19/2015   by Dr Benjamine Mola ;  tympanomastoidectomy   ROTATOR CUFF REPAIR Left 03/31/2016   TRANSURETHRAL RESECTION OF BLADDER TUMOR N/A 06/17/2021   Procedure: TRANSURETHRAL RESECTION OF BLADDER TUMOR (TURBT) WITH POST OPERATIVE INSTILLATION OF GEMCITABINE;  Surgeon: Festus Aloe, MD;  Location: [place];  Service: Urology;  Laterality: N/A;   TRANSURETHRAL RESECTION OF BLADDER TUMOR N/A 08/16/2021   Procedure: TRANSURETHRAL RESECTION OF BLADDER TUMOR (TURBT);  Surgeon: Festus Aloe, MD;  Location: [facility];  Service: Urology;  Laterality: N/A;   TYMPANOPLASTY Right 05/02/2016   Procedure: TYMPANOPLASTY;  Surgeon: Leta Baptist, MD;  Location: [place];  Service: ENT;  Laterality: Right;   UMBILICAL HERNIA REPAIR  2017     Family History  Problem Relation Age of Onset   Aortic aneurysm Other        Deceased   Cancer Other         Deceased   Cardiomyopathy Other        Alive   Anesthesia problems Neg Hx    Hypotension Neg Hx    Malignant hyperthermia Neg Hx    Pseudochol deficiency Neg Hx      Social History   Socioeconomic History   Marital status: Divorced    Spouse name: Not on file   Number of children: Not on file   Years of education: Not on file   Highest education level: Not on file  Occupational History   Not on file  Tobacco Use   Smoking status: Former    Packs/day: 1.00    Years: 21.00    Pack years: 21.00    Types: Cigarettes    Quit date: 09/08/2007    Years since quitting: 14.4   Smokeless tobacco: Former    Quit date: 2008  Vaping Use   Vaping Use: Never used  Substance and Sexual Activity   Alcohol use: Not Currently    Comment: quit 02-26-1999   Drug use: Not Currently    Comment: per pt quit 02-26-1999 in remission (-cocaine,Crack,heroin)   Sexual activity: Yes    Birth control/protection: None  Other Topics Concern   Not on file  Social History Narrative   Not on file   Social Determinants of Health   Financial Resource Strain: Not on file  Food Insecurity: Not on file  Transportation Needs: Not on file  Physical Activity: Not on file  Stress: Not on file  Social Connections: Not on file  Intimate Partner Violence: Not on file     BP 114/72   Pulse 69   Ht 6' (1.829 m)   Wt 183 lb (83 kg)   SpO2 96%   BMI 24.82 kg/m   Physical Exam:  Well appearing NAD HEENT: Unremarkable Neck:  No JVD, no thyromegally Lymphatics:  No adenopathy Back:  No CVA tenderness Lungs:  Clear HEART:  Regular rate rhythm, no murmurs, no rubs, no clicks Abd:  soft, positive bowel sounds, no organomegally, no rebound, no guarding Ext:  2 plus pulses, no edema, no cyanosis, no clubbing Skin:  No rashes no nodules Neuro:  CN II through XII intact, motor grossly intact  EKG Reviewed. Atrial fib with a RVR  Assess/Plan:  Atrial fib - his VR does not appear to be well  controlled. I have discussed the treatment options. I have recommended a trial of digoxin. I reviewed his med list and cannot see that it has been tried. He will continue toprol. I'll recheck a digoxin level in 3-4 weeks and then see him back in 4-6 weeks. If his rates are still up or he is intolerant of digoxin, then I would recommend AV node ablation and PPM insertion. I suspect that this is going to be what he ultimately needs but would like to try medications first. Chronic systolic heart failure - I think that the EF will improve when we get his HR controlled.   Kenneth Overlie Jessamy Torosyan,MD

## 2022-03-07 ENCOUNTER — Encounter: Payer: Self-pay | Admitting: Internal Medicine

## 2022-03-07 ENCOUNTER — Ambulatory Visit (INDEPENDENT_AMBULATORY_CARE_PROVIDER_SITE_OTHER): Payer: Medicare Other | Admitting: Internal Medicine

## 2022-03-07 DIAGNOSIS — R0609 Other forms of dyspnea: Secondary | ICD-10-CM | POA: Diagnosis not present

## 2022-03-07 NOTE — Progress Notes (Signed)
Kenneth [name], male    DOB: 1951-01-08,   MRN: [id number]   Brief patient profile:  48   yowm  quit smoking 2008  referred to pulmonary clinic in [place]  03/07/2022 by Estella Husk  for doe  ? Related to chemo/RT  Last RT Jan 2023  Last chemo complicated by low plt Dec 2022      History of Present Illness  03/07/2022  Pulmonary/ 1st office eval/ Melvyn Novas / [facility]  Chief Complaint  Patient presents with   Consult    Dr. Harl Bowie ref for emphysema. States he has no SOB or cough  Dyspnea:  MMRC2 = can't walk a nl pace on a flat grade s sob but does fine slow and flat but feels like going to pass out if goes faster Cough: none  Sleep: flat on 1-2 pillows  SABA use: none   Past Medical History:  Diagnosis Date   Anticoagulant long-term use    eliquis--- managed by cardiology   Arthritis    osteoporosis   Atrial fibrillation, unspecified type [facility]) 2018   cardiologist--- dr j. branch   Bladder neoplasm    Bradycardia    COPD (chronic obstructive pulmonary disease) ([place])    Gallbladder polyp    followed by surgeon, dr r. cathy (lov note in epic 06-27-2021 stated surgical management after pt has completed bladder cancer treatment   GERD (gastroesophageal reflux disease)    Heart murmur    History of drug abuse in remission ([place])    per pt in remission since 02-26-1999   History of hepatitis C 12/2020   followed by dr d. Montez Morita (aph/ [facility]);  dx 03/ 2022, liver bx 01-12-2021 g2hepatitis, f2;  completed 12 wks treatment 04-14-2021 w/ epclusa, hcv undetectable 04-21-2021   Mitral valve regurgitation    followed by dr branch---  last echo in epic 02-10-2021  moderate MR without stenosis   Osteoporosis    Wears dentures    full upper and lower partial   Wears hearing aid in left ear     Outpatient Medications Prior to Visit  Medication Sig Dispense Refill   acetaminophen (TYLENOL) 500 MG tablet Take 500-1,000 mg by mouth every 6  (six) hours as needed for moderate pain.     alendronate (FOSAMAX) 70 MG tablet Take 70 mg by mouth once a week. Takes on Thursday's ;  Take with a full glass of water on an empty stomach.     apixaban (ELIQUIS) 5 MG TABS tablet Take 1 tablet (5 mg total) by mouth 2 (two) times daily. 60 tablet 0   Ascorbic Acid (VITAMIN C) 1000 MG tablet Take 1,000 mg by mouth 2 (two) times daily.     atorvastatin (LIPITOR) 40 MG tablet Take 1 tablet (40 mg total) by mouth daily. 90 tablet 3   bacitracin-neomycin-polymyxin-hydrocortisone (CORTISPORIN) 1 % ointment Apply 1 application topically 2 (two) times daily. 15 g 3   calcium-vitamin D (OSCAL WITH D) 500-200 MG-UNIT tablet Take 1 tablet by mouth 2 (two) times daily.     digoxin (LANOXIN) 0.125 MG tablet Take 1 tablet (0.125 mg total) by mouth daily. 90 tablet 3   furosemide (LASIX) 40 MG tablet Take 40 mg daily.May take an additional dose for led swelling or weight gain 90 tablet 3   loperamide (IMODIUM A-D) 2 MG tablet Take 2 mg by mouth 4 (four) times daily as needed for diarrhea or loose stools.     Magnesium 250  MG TABS Take 250 mg by mouth daily.     metoprolol succinate (TOPROL-XL) 100 MG 24 hr tablet Take 1 tablet (100 mg total) by mouth daily. Take with or immediately following a meal. 90 tablet 3   Multiple Vitamins-Minerals (MULTIVITAMIN PO) Take 1 tablet by mouth daily.     omeprazole (PRILOSEC) 20 MG capsule Take 20 mg by mouth daily.     Polyethyl Glycol-Propyl Glycol (SYSTANE OP) Place 1 drop into both eyes daily as needed (dry eyes).     potassium chloride SA (KLOR-CON M) 20 MEQ tablet Take 1 tablet (20 mEq total) by mouth daily. 90 tablet 3   prochlorperazine (COMPAZINE) 10 MG tablet Take 1 tablet (10 mg total) by mouth every 6 (six) hours as needed for nausea or vomiting. 30 tablet 0   pseudoephedrine (SUDAFED) 30 MG tablet Take 30 mg by mouth daily as needed for congestion.     sildenafil (REVATIO) 20 MG tablet Take 20-60 mg by mouth daily  as needed (erectile dysfunction).     tiZANidine (ZANAFLEX) 4 MG capsule Take 4 mg by mouth 2 (two) times daily as needed for muscle spasms.     No facility-administered medications prior to visit.     Objective:     BP 126/68 (BP Location: Left Arm, Patient Position: Sitting)   Pulse 68   Temp 97.7 F (36.5 C) (Temporal)   Ht 6' (1.829 m)   Wt 186 lb 12.8 oz (84.7 kg)   SpO2 99% Comment: ra  BMI 25.33 kg/m   SpO2: 99 % (ra)  Amb somber wm nad    HEENT : Oropharynx  upper dentures  Nasal turbintes nl    NECK :  without  appent JVD/ palpable Nodes/TM    LUNGS: no acc muscle use,  Nl contour chest which is clear to A and P bilaterally without cough on insp or exp maneuvers   CV:  IRIR   no s3 or murmur or increase in P2, and no edema   ABD:  soft and nontender with nl inspiratory excursion in the supine position. No bruits or organomegaly appreciated   MS:  Nl gait/ ext warm without deformities Or obvious joint restrictions  calf tenderness, cyanosis or clubbing     SKIN: warm and dry without lesions    NEURO:  alert, approp, nl sensorium with  no motor or cerebellar deficits apparent.      I personally reviewed images and agree with radiology impression as follows:   Chest LDSCT  01/23/22  1. Lung-RADS 2, benign appearance or behavior. Continue annual screening with low-dose chest CT without contrast in 12 months. 2. Aortic atherosclerosis (ICD10-I70.0). Coronary artery calcification. 3.  Emphysema  ( my interpretation, mild centrilobular emphysema) Assessment   DOE (dyspnea on exertion) Quit smoking 2008 s apparent sequelae - Echo 11/22/21   1. LV EF  35 to 40%. LV  moderately decreased function. The left ventricle  demonstrates global hypokinesis. There is moderate  LVH  2. Right ventricular systolic function is moderately reduced. The right  ventricular size is severely enlarged. There is mildly elevated PAS  3. Left atrial size was severely dilated.    4. Right atrial size was severely dilated.   5. The mitral valve is normal in structure. Mild MR  No evidence of mitral stenosis.   6. The tricuspid valve is abnormal. Tricuspid valve regurgitation is  moderate to severe.   7. The aortic valve is tricuspid. Aortic valve regurgitation is not  visualized. No aortic stenosis is present.   8. The inferior vena cava is dilated in size with <50% respiratory  variability, suggesting right atrial pressure of 15 mmHg. - LDSCT  01/23/22  Emphysema, mild centrilobular and paraseptal  - 03/07/2022   Walked on RA  x  3  lap(s) =  approx 450  ft  @ moderate pace, stopped due to end of study, min sob  with lowest 02 sats 98%   When respiratory symptoms begin or become refractory well after a patient reports complete smoking cessation,  especially when this wasn't the case while they were smoking, a red flag is raised based on the work of Dr Kris Mouton which states:  if you quit smoking when your best day FEV1 is still well preserved it is highly unlikely you will progress to severe disease.  That is to say, once the smoking stops,  the symptoms should not suddenly erupt or markedly worsen.  If so, the differential diagnosis should include  obesity/deconditioning,  LPR/Reflux/Aspiration syndromes,  occult CHF(see echo above, already being addressed in cards clinic) , or  especially side effect of medications commonly used in this population (esp chemo and RT as in this case)   Since he is completely back to baseline s any specific rx I don't rec pfts or further imaging at this point but rather regular paced submax ex and f/u here pnr losing ground for any reason.   Each maintenance medication was reviewed in detail including emphasizing most importantly the difference between maintenance and prns and under what circumstances the prns are to be triggered using an action plan format where appropriate.  Total time for H and P, chart review, counseling,  directly  observing portions of ambulatory 02 saturation study/ and generating customized AVS unique to this office visit / same day charting =  45 min with pt new to me                  Christinia Gully, MD 03/07/2022

## 2022-03-07 NOTE — Assessment & Plan Note (Addendum)
Quit smoking 2008 s apparent sequelae - Echo 11/22/21  1. LV EF  35 to 40%. LV  moderately decreased function. The left ventricle  demonstrates global hypokinesis. There is moderate  LVH 2. Right ventricular systolic function is moderately reduced. The right  ventricular size is severely enlarged. There is mildly elevated PAS 3. Left atrial size was severely dilated.  4. Right atrial size was severely dilated.  5. The mitral valve is normal in structure. Mild MR  No evidence of mitral stenosis.  6. The tricuspid valve is abnormal. Tricuspid valve regurgitation is  moderate to severe.  7. The aortic valve is tricuspid. Aortic valve regurgitation is not  visualized. No aortic stenosis is present.  8. The inferior vena cava is dilated in size with <50% respiratory  variability, suggesting right atrial pressure of 15 mmHg. - LDSCT  01/23/22  Emphysema, mild centrilobular and paraseptal  - 03/07/2022   Walked on RA  x  3  lap(s) =  approx 450  ft  @ moderate pace, stopped due to end of study, min sob  with lowest 02 sats 98%   When respiratory symptoms begin or become refractory well after a patient reports complete smoking cessation,  especially when this wasn't the case while they were smoking, a red flag is raised based on the work of Dr Kris Mouton which states:  if you quit smoking when your best day FEV1 is still well preserved it is highly unlikely you will progress to severe disease.  That is to say, once the smoking stops,  the symptoms should not suddenly erupt or markedly worsen.  If so, the differential diagnosis should include  obesity/deconditioning,  LPR/Reflux/Aspiration syndromes,  occult CHF(see echo above, already being addressed in cards clinic) , or  especially side effect of medications commonly used in this population (esp chemo and RT as in this case)   Since he is completely back to baseline s any specific rx I don't rec pfts or further imaging at this point but rather  regular paced submax ex and f/u here pnr losing ground for any reason.   Each maintenance medication was reviewed in detail including emphasizing most importantly the difference between maintenance and prns and under what circumstances the prns are to be triggered using an action plan format where appropriate.  Total time for H and P, chart review, counseling,  directly observing portions of ambulatory 02 saturation study/ and generating customized AVS unique to this office visit / same day charting =  45 min with pt new to me

## 2022-03-07 NOTE — Patient Instructions (Signed)
Continued paced exercise and call if losing ground with your walking at [employer]   Pulmonary follow up is as needed

## 2022-03-13 DIAGNOSIS — I700 Atherosclerosis of aorta: Secondary | ICD-10-CM | POA: Diagnosis not present

## 2022-03-13 DIAGNOSIS — I10 Essential (primary) hypertension: Secondary | ICD-10-CM | POA: Diagnosis not present

## 2022-03-13 DIAGNOSIS — Z299 Encounter for prophylactic measures, unspecified: Secondary | ICD-10-CM | POA: Diagnosis not present

## 2022-03-13 DIAGNOSIS — D696 Thrombocytopenia, unspecified: Secondary | ICD-10-CM | POA: Diagnosis not present

## 2022-03-15 ENCOUNTER — Other Ambulatory Visit: Payer: Self-pay | Admitting: Nurse Practitioner

## 2022-03-15 DIAGNOSIS — H7011 Chronic mastoiditis, right ear: Secondary | ICD-10-CM | POA: Diagnosis not present

## 2022-03-21 DIAGNOSIS — Z299 Encounter for prophylactic measures, unspecified: Secondary | ICD-10-CM | POA: Diagnosis not present

## 2022-03-21 DIAGNOSIS — Z6825 Body mass index (BMI) 25.0-25.9, adult: Secondary | ICD-10-CM | POA: Diagnosis not present

## 2022-03-21 DIAGNOSIS — M25511 Pain in right shoulder: Secondary | ICD-10-CM | POA: Diagnosis not present

## 2022-03-21 DIAGNOSIS — I10 Essential (primary) hypertension: Secondary | ICD-10-CM | POA: Diagnosis not present

## 2022-03-23 ENCOUNTER — Other Ambulatory Visit (HOSPITAL_COMMUNITY)
Admission: RE | Admit: 2022-03-23 | Discharge: 2022-03-23 | Disposition: A | Discharge status: Home / Self Care | Payer: Medicare Other | Source: Ambulatory Visit | Attending: Cardiology | Admitting: Cardiology

## 2022-03-23 DIAGNOSIS — Z5181 Encounter for therapeutic drug level monitoring: Secondary | ICD-10-CM | POA: Diagnosis not present

## 2022-03-23 DIAGNOSIS — Z79899 Other long term (current) drug therapy: Secondary | ICD-10-CM | POA: Diagnosis not present

## 2022-03-23 DIAGNOSIS — I4819 Other persistent atrial fibrillation: Secondary | ICD-10-CM | POA: Insufficient documentation

## 2022-03-23 LAB — DIGOXIN LEVEL: Digoxin Level: <0.2 ng/mL — ABNORMAL LOW (ref 0.8–2.0)

## 2022-03-27 ENCOUNTER — Encounter: Payer: Self-pay | Admitting: *Deleted

## 2022-03-27 ENCOUNTER — Telehealth: Payer: Self-pay | Admitting: Internal Medicine

## 2022-03-27 ENCOUNTER — Telehealth: Payer: Self-pay | Admitting: Oncology

## 2022-03-27 NOTE — Telephone Encounter (Signed)
Called patient regarding upcoming July appointment, patient is notified. 

## 2022-03-27 NOTE — Telephone Encounter (Signed)
Pt is requesting a call back in regards to labs done on 06/15. He states he is just following up on the low levels and what the next steps will be.

## 2022-03-27 NOTE — Telephone Encounter (Signed)
Spoke with pt and he ask if he should continue Eliquis? Pt states that he had recent labs done at PCP office and was told that his levels were down. Reminded pt that he was call in regards to Digoxin level. He states that Dr. Woody Seller office called too and he is unsure if he should continue Eliquis. Labs requested from Dr. Woody Seller.

## 2022-04-03 DIAGNOSIS — Z8551 Personal history of malignant neoplasm of bladder: Secondary | ICD-10-CM | POA: Diagnosis not present

## 2022-04-05 NOTE — Telephone Encounter (Signed)
Per Dr.Taylor, no changes with current Digoxin dose.

## 2022-04-17 ENCOUNTER — Encounter: Payer: Self-pay | Admitting: Cardiology

## 2022-04-17 ENCOUNTER — Ambulatory Visit (INDEPENDENT_AMBULATORY_CARE_PROVIDER_SITE_OTHER): Payer: Medicare Other | Admitting: Cardiology

## 2022-04-17 VITALS — BP 100/60 | HR 64 | Ht 72.0 in | Wt 188.8 lb

## 2022-04-17 DIAGNOSIS — I4819 Other persistent atrial fibrillation: Secondary | ICD-10-CM | POA: Diagnosis not present

## 2022-04-17 DIAGNOSIS — I5022 Chronic systolic (congestive) heart failure: Secondary | ICD-10-CM | POA: Diagnosis not present

## 2022-04-17 NOTE — Progress Notes (Signed)
Clinical Summary Mr. Fullilove is a 71 y.o.male seen today for follow up of the following medical problems.    1. Afib 09/2021 monitor: 100% afib, Patient was in afib throughout the study, range 64-213 bpm, average HR 110 -prevoiusly off elqiuis due to hematuria and pancytopenia related to bladder cancer and chemo. Most recently has been on anticoag and tolearing.   - very severe left atrial enlargement, LAVI 63. Had not been able to be on eliquis ppveriously Have not pursued trying to reestablish SR.    - seen 01/12/22 by Dr Johney Frame - low bps 80s/60s, presyncopal - toprol lowered to $RemoveBe'100mg'cJQhcNTKF$  daily, losartan held, lasix decreased to $RemoveBefo'40mg'qfSogRNnKFs$  daily - bp's improved today 110/64 - remains on eliquis, no recent hematuria.    - seen by EP, started on digoxin - if failed consideration for av nodal ablation and ppm - denies any palpitations though has never had palpitations.  - breathing much improved - no bleeding on eliquis     2. Chronic systolic/diastlic HF.  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 Working on rate control   12/13/2021 CXR no acute process 12/13/2021 K 3.2 Cr 0.82 BUN 16 -leg swelling resolved at one point. He had stopped his lasix when swelling resolved, has had some recurrence - 01/23/22 labs: K 4.3 Cr 0.97 BUN 23 BNP 506   - no recent edmea. Taking $RemoveBefor'40mg'PxxBhHTImqGj$  daily - home scale 174 lbs.    - has not checked home scale. SOB/DOE improved. No LE edema - compliant with meds     3.Bladder cancer - followed by oncology and urology - undergoing chemo and radiation, along with urology sTURBT  Oct 14 2021 onc note "he continues to receive definitive therapy with radiation and weekly  cisplatin.  Chemotherapy was withheld on October 06, 2021 due to cytopenia"   - from note has compelted current treatment course, now under observation     4. Lung cancer screening - atherosclerosis and coronary atherosclerosis noted   5. Gallbladder polyp - on hold chole given bladder cancer.     AAA screen Male over 55 with tobacco history. 09/2011 abdominal US without aneusyrm.      Past Medical History:  Diagnosis Date   Anticoagulant long-term use    eliquis--- managed by cardiology   Arthritis    osteoporosis   Atrial fibrillation, unspecified type [facility]) 2018   cardiologist--- dr j. Jazzlin Clements   Bladder neoplasm    Bradycardia    COPD (chronic obstructive pulmonary disease) ([place])    Gallbladder polyp    followed by surgeon, dr Alfonso Patten. cathy (lov note in epic 06-27-2021 stated surgical management after pt has completed bladder cancer treatment   GERD (gastroesophageal reflux disease)    Heart murmur    History of drug abuse in remission ([place])    per pt in remission since 02-26-1999   History of hepatitis C 12/2020   followed by dr d. Montez Morita (aph/ [facility] and hepatology);  dx 03/ 2022, liver bx 01-12-2021 g2hepatitis, f2;  completed 12 wks treatment 04-14-2021 w/ epclusa, hcv undetectable 04-21-2021   Mitral valve regurgitation    followed by dr Laureano Hetzer---  last  echo in epic 02-10-2021  moderate MR without stenosis   Osteoporosis    Wears dentures    full upper and lower partial   Wears hearing aid in left ear      No Known Allergies   Current Outpatient Medications  Medication Sig Dispense Refill   acetaminophen (TYLENOL) 500 MG tablet Take 500-1,000 mg by mouth every 6 (six) hours as needed for moderate pain.     alendronate (FOSAMAX) 70 MG tablet Take 70 mg by mouth once a week. Takes on Thursday's ;  Take with a full glass of water on an empty stomach.     apixaban (ELIQUIS) 5 MG TABS tablet Take 1 tablet (5 mg total) by mouth 2 (two) times daily. 60  tablet 0   Ascorbic Acid (VITAMIN C) 1000 MG tablet Take 1,000 mg by mouth 2 (two) times daily.     atorvastatin (LIPITOR) 40 MG tablet Take 1 tablet (40 mg total) by mouth daily. 90 tablet 3   bacitracin-neomycin-polymyxin-hydrocortisone (CORTISPORIN) 1 % ointment Apply 1 application topically 2 (two) times daily. 15 g 3   calcium-vitamin D (OSCAL WITH D) 500-200 MG-UNIT tablet Take 1 tablet by mouth 2 (two) times daily.     digoxin (LANOXIN) 0.125 MG tablet Take 1 tablet (0.125 mg total) by mouth daily. 90 tablet 3   furosemide (LASIX) 40 MG tablet Take 40 mg daily.May take an additional dose for led swelling or weight gain 90 tablet 3   loperamide (IMODIUM A-D) 2 MG tablet Take 2 mg by mouth 4 (four) times daily as needed for diarrhea or loose stools.     Magnesium 250 MG TABS Take 250 mg by mouth daily.     metoprolol succinate (TOPROL-XL) 100 MG 24 hr tablet Take 1 tablet (100 mg total) by mouth daily. Take with or immediately following a meal. 90 tablet 3   Multiple Vitamins-Minerals (MULTIVITAMIN PO) Take 1 tablet by mouth daily.     omeprazole (PRILOSEC) 20 MG capsule Take 20 mg by mouth daily.     Polyethyl Glycol-Propyl Glycol (SYSTANE OP) Place 1 drop into both eyes daily as needed (dry eyes).     potassium chloride SA (KLOR-CON M) 20 MEQ tablet Take 1 tablet (20 mEq total) by mouth daily. 90 tablet 3   prochlorperazine (COMPAZINE) 10 MG tablet Take 1 tablet (10 mg total) by mouth every 6 (six) hours as needed for nausea or vomiting. 30 tablet 0   pseudoephedrine (SUDAFED) 30 MG tablet Take 30 mg by mouth daily as needed for congestion.     sildenafil (REVATIO) 20 MG tablet Take 20-60 mg by mouth daily as needed (erectile dysfunction).     tiZANidine (ZANAFLEX) 4 MG capsule Take 4 mg by mouth 2 (two) times daily as needed for muscle spasms.     No current facility-administered medications for this visit.     Past Surgical History:  Procedure Laterality Date   APPENDECTOMY     age  48   BROW LIFT AND BLEPHAROPLASTY Bilateral    APPROX. 2017   CATARACT EXTRACTION W/ INTRAOCULAR LENS IMPLANT Bilateral 2018   HEMORROIDECTOMY     2010 $Rem'@[facility]'mbU[facility]$  and 2018 @ [place] Right 08/13/2017   Procedure: RIGHT HYDROCELECTOMY;  Surgeon: Cleon Gustin, MD;  Location: [facility];  Service: Urology;  Laterality: Right;   HYDROCELE EXCISION Right 11/12/2017   Procedure: RIGHT HYDROCELECTOMY;  Surgeon: Cleon Gustin, MD;  Location: [facility];  Service: Urology;  Laterality:  Right;   INGUINAL HERNIA REPAIR Bilateral    APPROX.   [id number];   WITH UMBILICAL HERNIA AND EPIGRASTIC HERNIA REPAIRS   INGUINAL HERNIA REPAIR  09/11/2011   Procedure: HERNIA REPAIR INGUINAL ADULT;  Surgeon: Scherry Ran;  Location: [facility];  Service: General;  Laterality: Right;  Recurrent Right Inguinal Hernia Repair   MANDIBLE RECONSTRUCTION     x2  last one 1980;   from [facility]va  ( has retained hardware)   MIDDLE EAR SURGERY Right 02/19/2015   by Dr Benjamine Mola ;  tympanomastoidectomy   ROTATOR CUFF REPAIR Left 03/31/2016   TRANSURETHRAL RESECTION OF BLADDER TUMOR N/A 06/17/2021   Procedure: TRANSURETHRAL RESECTION OF BLADDER TUMOR (TURBT) WITH POST OPERATIVE INSTILLATION OF GEMCITABINE;  Surgeon: Festus Aloe, MD;  Location: [place];  Service: Urology;  Laterality: N/A;   TRANSURETHRAL RESECTION OF BLADDER TUMOR N/A 08/16/2021   Procedure: TRANSURETHRAL RESECTION OF BLADDER TUMOR (TURBT);  Surgeon: Festus Aloe, MD;  Location: [facility];  Service: Urology;  Laterality: N/A;   TYMPANOPLASTY Right 05/02/2016   Procedure: TYMPANOPLASTY;  Surgeon: Leta Baptist, MD;  Location: [place];  Service: ENT;  Laterality: Right;   UMBILICAL HERNIA REPAIR  2017     No Known Allergies    Family History  Problem Relation Age of Onset   Aortic aneurysm Other        Deceased   Cancer Other        Deceased   Cardiomyopathy Other        Alive    Anesthesia problems Neg Hx    Hypotension Neg Hx    Malignant hyperthermia Neg Hx    Pseudochol deficiency Neg Hx      Social History Mr. Misner reports that he quit smoking about 14 years ago. His smoking use included cigarettes. He has a 21.00 pack-year smoking history. He quit smokeless tobacco use about 15 years ago. Mr. Kantner reports that he does not currently use alcohol.   Review of Systems CONSTITUTIONAL: No weight loss, fever, chills, weakness or fatigue.  HEENT: Eyes: No visual loss, blurred vision, double vision or yellow sclerae.No hearing loss, sneezing, congestion, runny nose or sore throat.  SKIN: No rash or itching.  CARDIOVASCULAR: per hpi RESPIRATORY: No shortness of breath, cough or sputum.  GASTROINTESTINAL: No anorexia, nausea, vomiting or diarrhea. No abdominal pain or blood.  GENITOURINARY: No burning on urination, no polyuria NEUROLOGICAL: No headache, dizziness, syncope, paralysis, ataxia, numbness or tingling in the extremities. No change in bowel or bladder control.  MUSCULOSKELETAL: No muscle, back pain, joint pain or stiffness.  LYMPHATICS: No enlarged nodes. No history of splenectomy.  PSYCHIATRIC: No history of depression or anxiety.  ENDOCRINOLOGIC: No reports of sweating, cold or heat intolerance. No polyuria or polydipsia.  [name] Kitchen   Physical Examination Today's Vitals   04/17/22 1543  BP: 100/60  Pulse: 64  SpO2: 98%  Weight: 188 lb 12.8 oz (85.6 kg)  Height: 6' (1.829 m)   Body mass index is 25.61 kg/m.  Gen: resting comfortably, no acute distress HEENT: no scleral icterus, pupils equal round and reactive, no palptable cervical adenopathy,  CV: irreg, no m/r/g no jvd Resp: Clear to auscultation bilaterally GI: abdomen is soft, non-tender, non-distended, normal bowel sounds, no hepatosplenomegaly MSK: extremities are warm, no edema.  Skin: warm, no rash Neuro:  no focal deficits Psych: appropriate affect   Diagnostic Studies  11/2017  echo Study Conclusions   - Left ventricle: The cavity size  was mildly dilated. Wall   thickness was increased in a pattern of mild LVH. Systolic   function was normal. The estimated ejection fraction was in the   range of 50% to 55%. Although no diagnostic regional wall motion   abnormality was identified, this possibility cannot be completely   excluded on the basis of this study. The study was not   technically sufficient to allow evaluation of LV diastolic   dysfunction due to atrial fibrillation. - Aortic valve: Moderately calcified annulus. Trileaflet. - Mitral valve: Mildly thickened leaflets . Systolic bowing without   prolapse. There was moderate regurgitation. - Left atrium: The atrium was mildly dilated. - Right ventricle: The cavity size was mildly dilated. - Right atrium: The atrium was moderately dilated. Central venous   pressure (est): 8 mm Hg. - Atrial septum: No defect or patent foramen ovale was identified. - Tricuspid valve: There was mild-moderate regurgitation. - Pulmonary arteries: PA peak pressure: 29 mm Hg (S). - Pericardium, extracardiac: A small pericardial effusion was   identified anterior to the heart.       09/2021 Zio patch 14 day monitor Patient was in afib throughout the study, range 64-213 bpm, average HR 110 Rare ventricular ectopy in the form of isolated PVCs, couplets, triplets. Rare short episodes of wide complex tachycardia that appear more consistent with SVT with aberrancy as opposed to NSVT No symptoms reported Overall 100% afib burden with very high heart rates at times.     Patch Wear Time:  14 days and 0 hours (2022-12-06T07:52:31-0500 to 2022-12-20T07:52:35-0500)   7 Ventricular Tachycardia runs occurred, the run with the fastest interval lasting 4 beats with a max rate of 197 bpm, the longest lasting 11 beats with an avg rate of 145 bpm. Atrial Fibrillation occurred continuously (100% burden), ranging from 64-213  bpm (avg of 110  bpm). Intermittent Bundle Tanith Dagostino Block was present. Isolated VEs were rare (<1.0%, 14535), VE Couplets were rare (<1.0%, 1056), and VE Triplets were rare (<1.0%, 13). Ventricular Bigeminy was present. MD notification criteria for Rapid  Atrial Fibrillation met - report posted prior to notification per account request (ES).   11/2021 echo 1. Left ventricular ejection fraction, by estimation, is 35 to 40%. The  left ventricle has moderately decreased function. The left ventricle  demonstrates global hypokinesis. There is moderate left ventricular  hypertrophy. Left ventricular diastolic  parameters are indeterminate.   2. Right ventricular systolic function is moderately reduced. The right  ventricular size is severely enlarged. There is mildly elevated pulmonary  artery systolic pressure.   3. Left atrial size was severely dilated.   4. Right atrial size was severely dilated.   5. The mitral valve is normal in structure. Mild mitral valve  regurgitation. No evidence of mitral stenosis.   6. The tricuspid valve is abnormal. Tricuspid valve regurgitation is  moderate to severe.   7. The aortic valve is tricuspid. Aortic valve regurgitation is not  visualized. No aortic stenosis is present.   8. The inferior vena cava is dilated in size with <50% respiratory  variability, suggesting right atrial pressure of 15 mmHg.   Assessment and Plan  1. Persistent Afib - did not tolerate toprol 150 due to low bp's, back on $Remov'100mg'tvaydO$  daily - tolerating eliquis now (previously had hematuria, pancytopenia for bladder cancer which he has completed treatment).  - severe LAE with LAVI in 60s - Difficultly rate controlling due to bp's, rates look to be improved since digoxin added by EP. Symptoms much  improved. Has f/u with EP this week to reassess.      2. 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 Cisplatin rare for cardiotoxicity so I don't  think related - would not plan on cath given bladder cancer, intermittent need for chemo and pancytopenia - medical therapy limited by low bp's - continue toprol, recheck limited echo in 1 month now that rates are controlled. If ongoing dysfunction could consider SGLT2i at that time.  - euvolemic today without symptoms.      F/u 2 months      Arnoldo Lenis, M.D.

## 2022-04-17 NOTE — Patient Instructions (Addendum)
Medication Instructions:  Continue all current medications.  Labwork: none  Testing/Procedures: Your physician has requested that you have a limited echocardiogram. Echocardiography is a painless test that uses sound waves to create images of your heart. It provides your doctor with information about the size and shape of your heart and how well your heart's chambers and valves are working. This procedure takes approximately one hour. There are no restrictions for this procedure - DUE END OF AUGUST   Follow-Up: 2 months   Any Other Special Instructions Will Be Listed Below (If Applicable).   If you need a refill on your cardiac medications before your next appointment, please call your pharmacy.

## 2022-04-19 ENCOUNTER — Encounter: Payer: Self-pay | Admitting: Internal Medicine

## 2022-04-19 ENCOUNTER — Ambulatory Visit (INDEPENDENT_AMBULATORY_CARE_PROVIDER_SITE_OTHER): Payer: Medicare Other | Admitting: Internal Medicine

## 2022-04-19 VITALS — BP 105/56 | HR 63 | Ht 72.0 in | Wt 187.8 lb

## 2022-04-19 DIAGNOSIS — I4819 Other persistent atrial fibrillation: Secondary | ICD-10-CM

## 2022-04-19 NOTE — Progress Notes (Signed)
HPI [name] returns today for ongoing evaluation of atrial fib. He is a pleasant 71 yo man with longstanding atrial fib, and recent development of a tachy induced CM. His atrial fib dates back at least 5 years. His rates have increased and a heart monitor obtained in December demonstated an ave HR of 145. He has not had syncope. He has been treated with chemo and XRT for bladder CA. He has been treated under my direction with a combination of a beta blocker and digoxin. He feels much better and his HR appears to be well controlled. His dyspnea is much better.    No Known Allergies   Current Outpatient Medications  Medication Sig Dispense Refill   acetaminophen (TYLENOL) 500 MG tablet Take 500-1,000 mg by mouth every 6 (six) hours as needed for moderate pain.     alendronate (FOSAMAX) 70 MG tablet Take 70 mg by mouth once a week. Takes on Thursday's ;  Take with a full glass of water on an empty stomach.     apixaban (ELIQUIS) 5 MG TABS tablet Take 1 tablet (5 mg total) by mouth 2 (two) times daily. 60 tablet 0   Ascorbic Acid (VITAMIN C) 1000 MG tablet Take 1,000 mg by mouth 2 (two) times daily.     atorvastatin (LIPITOR) 40 MG tablet Take 1 tablet (40 mg total) by mouth daily. 90 tablet 3   calcium-vitamin D (OSCAL WITH D) 500-200 MG-UNIT tablet Take 1 tablet by mouth 2 (two) times daily.     digoxin (LANOXIN) 0.125 MG tablet Take 1 tablet (0.125 mg total) by mouth daily. 90 tablet 3   furosemide (LASIX) 40 MG tablet Take 40 mg daily.May take an additional dose for led swelling or weight gain 90 tablet 3   Magnesium 250 MG TABS Take 250 mg by mouth daily.     metoprolol succinate (TOPROL-XL) 100 MG 24 hr tablet Take 1 tablet (100 mg total) by mouth daily. Take with or immediately following a meal. 90 tablet 3   Multiple Vitamins-Minerals (MULTIVITAMIN PO) Take 1 tablet by mouth daily.     omeprazole (PRILOSEC) 20 MG capsule Take 20 mg by mouth daily.     Polyethyl Glycol-Propyl  Glycol (SYSTANE OP) Place 1 drop into both eyes daily as needed (dry eyes).     potassium chloride SA (KLOR-CON M) 20 MEQ tablet Take 1 tablet (20 mEq total) by mouth daily. 90 tablet 3   pseudoephedrine (SUDAFED) 30 MG tablet Take 30 mg by mouth daily as needed for congestion.     sildenafil (REVATIO) 20 MG tablet Take 20-60 mg by mouth daily as needed (erectile dysfunction).     tiZANidine (ZANAFLEX) 4 MG capsule Take 4 mg by mouth 2 (two) times daily as needed for muscle spasms.     bacitracin-neomycin-polymyxin-hydrocortisone (CORTISPORIN) 1 % ointment Apply 1 application topically 2 (two) times daily. (Patient not taking: Reported on 04/19/2022) 15 g 3   loperamide (IMODIUM A-D) 2 MG tablet Take 2 mg by mouth 4 (four) times daily as needed for diarrhea or loose stools. (Patient not taking: Reported on 04/19/2022)     prochlorperazine (COMPAZINE) 10 MG tablet Take 1 tablet (10 mg total) by mouth every 6 (six) hours as needed for nausea or vomiting. (Patient not taking: Reported on 04/19/2022) 30 tablet 0   No current facility-administered medications for this visit.     Past Medical History:  Diagnosis Date   Anticoagulant long-term use  eliquis--- managed by cardiology   Arthritis    osteoporosis   Atrial fibrillation, unspecified type [facility]) 2018   cardiologist--- dr j. branch   Bladder neoplasm    Bradycardia    COPD (chronic obstructive pulmonary disease) ([place])    Gallbladder polyp    followed by surgeon, dr r. 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 Montez Morita (aph/ [facility] and hepatology);  dx 03/ 2022, liver bx 01-12-2021 g2hepatitis, f2;  completed 12 wks treatment 04-14-2021 w/ epclusa, hcv undetectable  04-21-2021   Mitral valve regurgitation    followed by dr branch---  last echo in epic 02-10-2021  moderate MR without stenosis   Osteoporosis    Wears dentures    full upper and lower partial   Wears hearing aid in left ear     ROS:   All systems reviewed and negative except as noted in the HPI.   Past Surgical History:  Procedure Laterality Date   APPENDECTOMY     age 86   BROW LIFT AND BLEPHAROPLASTY Bilateral    APPROX. 2017   CATARACT EXTRACTION W/ INTRAOCULAR LENS IMPLANT Bilateral 2018   HEMORROIDECTOMY     2010 '@[facility]'$  and 2018 @ [place] Right 08/13/2017   Procedure: RIGHT HYDROCELECTOMY;  Surgeon: Cleon Gustin, MD;  Location: [facility];  Service: Urology;  Laterality: Right;   HYDROCELE EXCISION Right 11/12/2017   Procedure: RIGHT HYDROCELECTOMY;  Surgeon: Cleon Gustin, MD;  Location: [facility];  Service: Urology;  Laterality: Right;   INGUINAL HERNIA REPAIR Bilateral    APPROX.   [id number];   WITH UMBILICAL HERNIA AND EPIGRASTIC HERNIA REPAIRS   INGUINAL HERNIA REPAIR  09/11/2011   Procedure: HERNIA REPAIR INGUINAL ADULT;  Surgeon: Scherry Ran;  Location: [facility];  Service: General;  Laterality: Right;  Recurrent Right Inguinal Hernia Repair   MANDIBLE RECONSTRUCTION     x2  last one 1980;   from [facility]  ( has retained hardware)   MIDDLE EAR SURGERY Right 02/19/2015   by Dr Benjamine Mola ;  tympanomastoidectomy   ROTATOR CUFF REPAIR Left 03/31/2016   TRANSURETHRAL RESECTION OF BLADDER TUMOR N/A 06/17/2021   Procedure: TRANSURETHRAL RESECTION OF BLADDER TUMOR (TURBT) WITH POST OPERATIVE INSTILLATION OF GEMCITABINE;  Surgeon: Festus Aloe, MD;  Location: [place];  Service: Urology;  Laterality: N/A;   TRANSURETHRAL RESECTION OF BLADDER TUMOR N/A 08/16/2021   Procedure: TRANSURETHRAL RESECTION OF BLADDER TUMOR (TURBT);  Surgeon: Festus Aloe, MD;  Location: [facility];  Service: Urology;  Laterality: N/A;    TYMPANOPLASTY Right 05/02/2016   Procedure: TYMPANOPLASTY;  Surgeon: Leta Baptist, MD;  Location: [place];  Service: ENT;  Laterality: Right;   UMBILICAL HERNIA REPAIR  2017     Family History  Problem Relation Age of Onset   Aortic aneurysm Other        Deceased   Cancer Other        Deceased   Cardiomyopathy Other        Alive   Anesthesia problems Neg Hx    Hypotension Neg Hx    Malignant hyperthermia Neg Hx    Pseudochol deficiency Neg  Hx      Social History   Socioeconomic History   Marital status: Divorced    Spouse name: Not on file   Number of children: Not on file   Years of education: Not on file   Highest education level: Not on file  Occupational History   Not on file  Tobacco Use   Smoking status: Former    Packs/day: 1.00    Years: 21.00    Total pack years: 21.00    Types: Cigarettes    Quit date: 09/08/2007    Years since quitting: 14.6   Smokeless tobacco: Former    Quit date: 2008  Vaping Use   Vaping Use: Never used  Substance and Sexual Activity   Alcohol use: Not Currently    Comment: quit 02-26-1999   Drug use: Not Currently    Comment: per pt quit 02-26-1999 in remission (-cocaine,Crack,heroin)   Sexual activity: Yes    Birth control/protection: None  Other Topics Concern   Not on file  Social History Narrative   Not on file   Social Determinants of Health   Financial Resource Strain: Not on file  Food Insecurity: Not on file  Transportation Needs: Not on file  Physical Activity: Not on file  Stress: Not on file  Social Connections: Not on file  Intimate Partner Violence: Not on file     BP (!) 105/56   Pulse 63   Ht 6' (1.829 m)   Wt 187 lb 12.8 oz (85.2 kg)   SpO2 96%   BMI 25.47 kg/m   Physical Exam:  Well appearing NAD HEENT: Unremarkable Neck:  No JVD, no thyromegally Lymphatics:  No adenopathy Back:  No CVA tenderness Lungs:  Clear with no wheezes HEART:  IRegular rate rhythm, no murmurs, no  rubs, no clicks Abd:  soft, positive bowel sounds, no organomegally, no rebound, no guarding Ext:  2 plus pulses, no edema, no cyanosis, no clubbing Skin:  No rashes no nodules Neuro:  CN II through XII intact, motor grossly intact  EKG - reviewed. Atrial fib with a CVR   Assess/Plan:  Uncontrolled atrial fib - he is much improved on the combination of the beta blocker and digoxin.  Chronic systolic heart failure - his symptoms are much improved and I suspect that his EF will get better. Continue GDMT. 3. Coags - he has not had any bleeding on eliuqis. 4. Dyslipidemia - he will continue atorvastatin. No muscle aches.  Carleene Overlie Kenneth Urbanski,MD

## 2022-04-19 NOTE — Patient Instructions (Signed)
Medication Instructions:  Your physician recommends that you continue on your current medications as directed. Please refer to the Current Medication list given to you today.  *If you need a refill on your cardiac medications before your next appointment, please call your pharmacy*   Lab Work: NONE   If you have labs (blood work) drawn today and your tests are completely normal, you will receive your results only by: MyChart Message (if you have MyChart) OR A paper copy in the mail If you have any lab test that is abnormal or we need to change your treatment, we will call you to review the results.   Testing/Procedures: NONE    Follow-Up: At [facility], you and your health needs are our priority.  As part of our continuing mission to provide you with exceptional heart care, we have created designated Provider Care Teams.  These Care Teams include your primary Cardiologist (physician) and Advanced Practice Providers (APPs -  Physician Assistants and Nurse Practitioners) who all work together to provide you with the care you need, when you need it.  We recommend signing up for the patient portal called "MyChart".  Sign up information is provided on this After Visit Summary.  MyChart is used to connect with patients for Virtual Visits (Telemedicine).  Patients are able to view lab/test results, encounter notes, upcoming appointments, etc.  Non-urgent messages can be sent to your provider as well.   To learn more about what you can do with MyChart, go to [email or web address].    Your next appointment:   1 year(s)  The format for your next appointment:   In Person  Provider:   Gregg Taylor, MD    Other Instructions Thank you for choosing [facility]!    Important Information About Sugar       

## 2022-04-21 ENCOUNTER — Other Ambulatory Visit: Payer: Self-pay

## 2022-04-21 ENCOUNTER — Inpatient Hospital Stay: Payer: Medicare Other | Attending: Oncology

## 2022-04-21 ENCOUNTER — Inpatient Hospital Stay (HOSPITAL_BASED_OUTPATIENT_CLINIC_OR_DEPARTMENT_OTHER): Payer: Medicare Other | Admitting: Oncology

## 2022-04-21 VITALS — BP 112/73 | HR 89 | Temp 97.8°F | Resp 17 | Ht 72.0 in | Wt 190.3 lb

## 2022-04-21 DIAGNOSIS — C679 Malignant neoplasm of bladder, unspecified: Secondary | ICD-10-CM

## 2022-04-21 DIAGNOSIS — Z79899 Other long term (current) drug therapy: Secondary | ICD-10-CM | POA: Insufficient documentation

## 2022-04-21 DIAGNOSIS — I5033 Acute on chronic diastolic (congestive) heart failure: Secondary | ICD-10-CM

## 2022-04-21 DIAGNOSIS — I5023 Acute on chronic systolic (congestive) heart failure: Secondary | ICD-10-CM | POA: Insufficient documentation

## 2022-04-21 DIAGNOSIS — C7989 Secondary malignant neoplasm of other specified sites: Secondary | ICD-10-CM | POA: Diagnosis not present

## 2022-04-21 DIAGNOSIS — I4891 Unspecified atrial fibrillation: Secondary | ICD-10-CM

## 2022-04-21 DIAGNOSIS — C675 Malignant neoplasm of bladder neck: Secondary | ICD-10-CM | POA: Insufficient documentation

## 2022-04-21 LAB — CBC WITH DIFFERENTIAL (CANCER CENTER ONLY)
Abs Immature Granulocytes: 0.01 10*3/uL (ref 0.00–0.07)
Basophils Absolute: 0 10*3/uL (ref 0.0–0.1)
Basophils Relative: 0 %
Eosinophils Absolute: 0.1 10*3/uL (ref 0.0–0.5)
Eosinophils Relative: 4 %
HCT: 37 % — ABNORMAL LOW (ref 39.0–52.0)
Hemoglobin: 12.9 g/dL — ABNORMAL LOW (ref 13.0–17.0)
Immature Granulocytes: 0 %
Lymphocytes Relative: 26 %
Lymphs Abs: 1 10*3/uL (ref 0.7–4.0)
MCH: 32.2 pg (ref 26.0–34.0)
MCHC: 34.9 g/dL (ref 30.0–36.0)
MCV: 92.3 fL (ref 80.0–100.0)
Monocytes Absolute: 0.5 10*3/uL (ref 0.1–1.0)
Monocytes Relative: 13 %
Neutro Abs: 2.3 10*3/uL (ref 1.7–7.7)
Neutrophils Relative %: 57 %
Platelet Count: 104 10*3/uL — ABNORMAL LOW (ref 150–400)
RBC: 4.01 MIL/uL — ABNORMAL LOW (ref 4.22–5.81)
RDW: 14.8 % (ref 11.5–15.5)
WBC Count: 4 10*3/uL (ref 4.0–10.5)
nRBC: 0 % (ref 0.0–0.2)

## 2022-04-21 LAB — MAGNESIUM: Magnesium: 1.8 mg/dL (ref 1.7–2.4)

## 2022-04-21 LAB — CMP (CANCER CENTER ONLY)
ALT: 14 U/L (ref 0–44)
AST: 22 U/L (ref 15–41)
Albumin: 4.2 g/dL (ref 3.5–5.0)
Alkaline Phosphatase: 57 U/L (ref 38–126)
Anion gap: 6 (ref 5–15)
BUN: 13 mg/dL (ref 8–23)
CO2: 29 mmol/L (ref 22–32)
Calcium: 9.4 mg/dL (ref 8.9–10.3)
Chloride: 105 mmol/L (ref 98–111)
Creatinine: 0.92 mg/dL (ref 0.61–1.24)
GFR, Estimated: >60 mL/min (ref >60)
Glucose, Bld: 91 mg/dL (ref 70–99)
Potassium: 4.2 mmol/L (ref 3.5–5.1)
Sodium: 140 mmol/L (ref 135–145)
Total Bilirubin: 1.3 mg/dL — ABNORMAL HIGH (ref 0.3–1.2)
Total Protein: 7.1 g/dL (ref 6.5–8.1)

## 2022-04-21 LAB — BRAIN NATRIURETIC PEPTIDE: B Natriuretic Peptide: 216.1 pg/mL — ABNORMAL HIGH (ref 0.0–100.0)

## 2022-04-21 NOTE — Progress Notes (Signed)
Hematology and Oncology Follow Up Visit  Kenneth [name] [id number] 01-Nov-1950 71 y.o. 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  Prior Therapy: He is status post TURBT on June 17, 2021. 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    Definitive therapy with radiation and weekly cisplatin.  He completed therapy in December 2022.  Current therapy: Active surveillance.  Interim History: Kenneth [name] returns today for a follow-up visit.  Since last visit, he reports feeling well without any major complaints.  He denies any nausea or vomiting or abdominal pain.  He denies any urinary plaints.  He denies any urinary frequency urgency or hematuria.     Medications: Updated on review. Current Outpatient Medications  Medication Sig Dispense Refill   acetaminophen (TYLENOL) 500 MG tablet Take 500-1,000 mg by mouth every 6 (six) hours as needed for moderate pain.     alendronate (FOSAMAX) 70 MG tablet Take 70 mg by mouth once a week. Takes on Thursday's ;  Take with a full glass of water on an empty stomach.     apixaban (ELIQUIS) 5 MG TABS tablet Take 1 tablet (5 mg total) by mouth 2 (two) times daily. 60 tablet 0   Ascorbic Acid (VITAMIN C) 1000 MG tablet Take 1,000 mg by mouth 2 (two) times daily.     atorvastatin (LIPITOR) 40 MG tablet Take 1 tablet (40 mg total) by mouth daily. 90 tablet 3   bacitracin-neomycin-polymyxin-hydrocortisone (CORTISPORIN) 1 % ointment Apply 1 application topically 2 (two) times daily. (Patient not taking: Reported on 04/19/2022) 15 g 3   calcium-vitamin D (OSCAL WITH D) 500-200 MG-UNIT tablet Take 1 tablet by mouth 2 (two) times daily.     digoxin (LANOXIN) 0.125 MG tablet Take 1  tablet (0.125 mg total) by mouth daily. 90 tablet 3   furosemide (LASIX) 40 MG tablet Take 40 mg daily.May take an additional dose for led swelling or weight gain 90 tablet 3   loperamide (IMODIUM A-D) 2 MG tablet Take 2 mg by mouth 4 (four) times daily as needed for diarrhea or loose stools. (Patient not taking: Reported on 04/19/2022)     Magnesium 250 MG TABS Take 250 mg by mouth daily.     metoprolol succinate (TOPROL-XL) 100 MG 24 hr tablet Take 1 tablet (100 mg total) by mouth daily. Take with or immediately following a meal. 90 tablet 3   Multiple Vitamins-Minerals (MULTIVITAMIN PO) Take 1 tablet by mouth daily.     omeprazole (PRILOSEC) 20 MG capsule Take 20 mg by mouth daily.     Polyethyl Glycol-Propyl Glycol (SYSTANE OP) Place 1 drop into both eyes daily as needed (dry eyes).     potassium chloride SA (KLOR-CON M) 20 MEQ tablet Take 1 tablet (20 mEq total) by mouth daily. 90 tablet 3   prochlorperazine (COMPAZINE) 10 MG tablet Take 1 tablet (10 mg total) by mouth every 6 (six) hours as needed for nausea or vomiting. (Patient not taking: Reported on 04/19/2022) 30 tablet 0   pseudoephedrine (SUDAFED) 30 MG tablet Take 30 mg by mouth daily as needed for congestion.     sildenafil (REVATIO) 20 MG tablet Take 20-60 mg by mouth daily as needed (erectile dysfunction).     tiZANidine (ZANAFLEX) 4  MG capsule Take 4 mg by mouth 2 (two) times daily as needed for muscle spasms.     No current facility-administered medications for this visit.     Allergies: No Known Allergies    Physical Exam:   Blood pressure 112/73, pulse 89, temperature 97.8 F (36.6 C), temperature source Temporal, resp. rate 17, height 6' (1.829 m), weight 190 lb 4.8 oz (86.3 kg), SpO2 100 %.    ECOG: 1   General appearance: Alert, awake without any distress. Head: Atraumatic without abnormalities Oropharynx: Without any thrush or ulcers. Eyes: No scleral icterus. Lymph nodes: No lymphadenopathy noted in the  cervical, supraclavicular, or axillary nodes Heart:regular rate and rhythm, without any murmurs or gallops.   Lung: Clear to auscultation without any rhonchi, wheezes or dullness to percussion. Abdomin: Soft, nontender without any shifting dullness or ascites. Musculoskeletal: No clubbing or cyanosis. Neurological: No motor or sensory deficits. Skin: No rashes or lesions.        Lab Results: Lab Results  Component Value Date   WBC 4.0 04/21/2022   HGB 12.9 (L) 04/21/2022   HCT 37.0 (L) 04/21/2022   MCV 92.3 04/21/2022   PLT 104 (L) 04/21/2022     Chemistry      Component Value Date/Time   NA 143 01/23/2022 1055   K 4.3 01/23/2022 1055   CL 105 01/23/2022 1055   CO2 30 01/23/2022 1055   BUN 23 01/23/2022 1055   CREATININE 0.97 01/23/2022 1055   CREATININE 1.16 12/02/2021 1024      Component Value Date/Time   CALCIUM 9.2 01/23/2022 1055   ALKPHOS 47 10/28/2021 1456   AST 24 10/28/2021 1456   ALT 12 10/28/2021 1456   BILITOT 1.3 (H) 10/28/2021 1456         Impression and Plan:  71 year old with:  1.  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    The natural course of his disease were reviewed at this time and treatment choices were discussed.  CT scan of the chest completed on April 17 did not show any evidence of metastatic disease.  He continues to follow with Dr. Junious Silk with periodic cystoscopies.  I recommended obtaining abdominal imaging in the next 3 to 6 months.  He is agreeable with this plan.       2.  Renal function surveillance: Kidney function remained stable after platinum based therapy.   3.  Pancytopenia: Related to well chemotherapy and has resolved at this time.  He still have mild thrombocytopenia however.  His platelet count is adequate to undergo any treatments or any procedure.  I anticipate that his platelet count will continue to improve further out from his chemotherapy.   4.  Follow-up: In 3 months for  repeat evaluation after imaging studies.   30  minutes were spent on this encounter.  The time was dedicated to reviewing laboratory data, disease status update and outlining future plan of care discussion.   Kenneth Button, MD 7/14/202311:52 AM

## 2022-04-28 ENCOUNTER — Ambulatory Visit: Payer: Medicare Other | Admitting: Oncology

## 2022-04-28 ENCOUNTER — Other Ambulatory Visit: Payer: Medicare Other

## 2022-05-15 ENCOUNTER — Telehealth: Payer: Self-pay | Admitting: Cardiology

## 2022-05-15 NOTE — Telephone Encounter (Signed)
Results discussed with patient,copied pcp 

## 2022-05-15 NOTE — Telephone Encounter (Signed)
Pt returning a call to go over lab results

## 2022-06-05 DIAGNOSIS — K824 Cholesterolosis of gallbladder: Secondary | ICD-10-CM | POA: Diagnosis not present

## 2022-06-05 DIAGNOSIS — D61818 Other pancytopenia: Secondary | ICD-10-CM | POA: Diagnosis not present

## 2022-06-08 ENCOUNTER — Ambulatory Visit: Payer: Medicare Other | Attending: Cardiology

## 2022-06-08 DIAGNOSIS — I5022 Chronic systolic (congestive) heart failure: Secondary | ICD-10-CM | POA: Insufficient documentation

## 2022-06-09 LAB — ECHOCARDIOGRAM LIMITED
Calc EF: 59.5 %
S' Lateral: 3.03 cm
Single Plane A2C EF: 61.4 %
Single Plane A4C EF: 57.3 %

## 2022-06-13 ENCOUNTER — Telehealth: Payer: Self-pay

## 2022-06-13 NOTE — Telephone Encounter (Signed)
Patient notified and verbalized understanding. Patient had no questions or concerns at this time. PCP copied 

## 2022-06-13 NOTE — Telephone Encounter (Signed)
-----   Message from Arnoldo Lenis, MD sent at 06/13/2022  9:43 AM EDT ----- Heart function is back to normal which is great news  Zandra Abts MD

## 2022-06-14 ENCOUNTER — Telehealth: Payer: Self-pay | Admitting: Cardiology

## 2022-06-14 NOTE — Telephone Encounter (Signed)
Primary Cardiologist:Branch, Roderic Palau, MD  Chart reviewed as part of pre-operative protocol coverage. Because of Kenneth [name]'s past medical history and time since last visit, he/she will require a follow-up visit in order to better assess preoperative cardiovascular risk.  Pre-op covering staff: - Patient has an appointment with Dr. Harl Bowie on 07/05/22 which is appropriate for the timing of the surgery. - Please contact requesting surgeon's office via preferred method (i.e, phone, fax) to inform them of need for appointment prior to surgery.  Request to hold Eliquis has been addressed by Pharm D and is attached below.  [name], NP-C     06/14/2022, 4:10 PM [address] Office [phone] Fax [phone]

## 2022-06-14 NOTE — Telephone Encounter (Signed)
Patient with diagnosis of atrial fibrillation on Eliquis for anticoagulation.    Procedure: laparoscopic cholecystectomy Date of procedure: 07/18/22    CHA2DS2-VASc Score = 2   This indicates a 2.2% annual risk of stroke. The patient's score is based upon: CHF History: 1 HTN History: 0 Diabetes History: 0 Stroke History: 0 Vascular Disease History: 0 Age Score: 1 Gender Score: 0   CrCl 89 Platelet count 104  Per office protocol, patient can hold Eliquis for 2 days prior to procedure.   Patient will not need bridging with Lovenox (enoxaparin) around procedure.  **This guidance is not considered finalized until pre-operative APP has relayed final recommendations.**

## 2022-06-14 NOTE — Telephone Encounter (Signed)
   Pre-operative Risk Assessment    Patient Name: Kenneth [name]  DOB: May 24, 1951 MRN: [id number]      Request for Surgical Clearance    Procedure:   Laparoscopic cholecystectomy   Date of Surgery:  Clearance 07/18/22                                 Surgeon: DR. Ladona Horns  Surgeon's Group or Practice Name:  [facility] Phone number:  [phone] Fax number:  [phone]   Type of Clearance Requested:BOTH  [place].     Type of Anesthesia:  Not Indicated   Additional requests/questions:  Please advise surgeon/provider what medications should be held.  Kenneth [name]   06/14/2022, 9:21 AM

## 2022-06-15 NOTE — Telephone Encounter (Signed)
Pt has appt 07/05/22 with Dr. Gwenlyn Found. Pre op to be addressed at that time.

## 2022-07-05 ENCOUNTER — Encounter: Payer: Self-pay | Admitting: Cardiology

## 2022-07-05 ENCOUNTER — Ambulatory Visit: Payer: Medicare Other | Attending: Cardiology | Admitting: Cardiology

## 2022-07-05 VITALS — BP 118/76 | HR 64 | Ht 72.0 in | Wt 195.2 lb

## 2022-07-05 DIAGNOSIS — I5022 Chronic systolic (congestive) heart failure: Secondary | ICD-10-CM | POA: Insufficient documentation

## 2022-07-05 DIAGNOSIS — Z23 Encounter for immunization: Secondary | ICD-10-CM | POA: Diagnosis not present

## 2022-07-05 DIAGNOSIS — I4819 Other persistent atrial fibrillation: Secondary | ICD-10-CM | POA: Insufficient documentation

## 2022-07-05 DIAGNOSIS — Z01810 Encounter for preprocedural cardiovascular examination: Secondary | ICD-10-CM | POA: Diagnosis not present

## 2022-07-05 NOTE — Patient Instructions (Addendum)
Medication Instructions:  Continue all current medications.   Labwork: none  Testing/Procedures: none  Follow-Up: 6 months   Any Other Special Instructions Will Be Listed Below (If Applicable).   If you need a refill on your cardiac medications before your next appointment, please call your pharmacy.  

## 2022-07-05 NOTE — Progress Notes (Signed)
Clinical Summary Kenneth [name] is a 71 y.o.male seen today for follow up of the following medical problems.    1. Afib 09/2021 monitor: 100% afib, Patient was in afib throughout the study, range 64-213 bpm, average HR 110 -prevoiusly off elqiuis due to hematuria and pancytopenia related to bladder cancer and chemo. Most recently has been on anticoag and tolearing.   - very severe left atrial enlargement, LAVI 63. Had not been able to be on eliquis ppveriously Have not pursued trying to reestablish SR.    - seen 01/12/22 by Dr Johney Frame - low bps 80s/60s, presyncopal - toprol lowered to 157m daily, losartan held, lasix decreased to 491mdaily - bp's improved today 110/64 - remains on eliquis, no recent hematuria.      - seen by EP, started on digoxin - if failed consideration for av nodal ablation and ppm - has done very well since adding digoxin to his beta blocker  - isolated episode of palpitations.  - no recent bleeding on eliquis.      2. Chronic systolic/diastlic HF.  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 Working on rate control   12/13/2021 CXR no acute process 12/13/2021 K 3.2 Cr 0.82 BUN 16 -leg swelling resolved at one point. He had stopped his lasix when swelling resolved, has had some recurrence - 01/23/22 labs: K 4.3 Cr 0.97 BUN 23 BNP 506    05/2022 echo: LVEF 60-65% - no recent edema, no SOB/DOE.    3.Bladder cancer - followed by oncology and urology - undergoing chemo and radiation, along with urology sTURBT  Oct 14 2021 onc note "he continues to receive definitive therapy with radiation and weekly cisplatin.  Chemotherapy was withheld on October 06, 2021 due to  cytopenia"   - from note has compelted current treatment course, now under observation     4. Lung cancer screening - atherosclerosis and coronary atherosclerosis noted   5. .Preop evaluation - considering lap chole  -walks dog up to 20 min per day, can walk up 2-3 flights of stairs without significant symptoms.     AAA screen Male over [age]ith tobacco history. 09/2011 abdominal [place]rm.    Past Medical History:  Diagnosis Date   Anticoagulant long-term use    eliquis--- managed by cardiology   Arthritis    osteoporosis   [facility]   [facility]ologist--- dr j. Theseus Birnie   Bladder neoplasm    Bradycardia    COPD (chronic obstructive pulmonary disease) ([facility]   Gallbladder polyp    followed by surgeon, dr r.Alfonso Pattencathy (lov note in epic 06-27-2021 stated surgical management after pt has completed bladder cancer treatment   GERD (gastroesophageal reflux disease)    Heart murmur    History of drug abuse in remission ([facility]   per pt in remission since 02-26-1999   History of hepatitis C 12/2020   followed by dr d. caMontez Moritaaph/ [facility]);  dx 03/ 2022, liver bx 01-12-2021 g2hepatitis, f2;  completed 12 wks treatment 04-14-2021 w/ epclusa, hcv undetectable 04-21-2021   Mitral valve regurgitation    followed by  dr Mackinley Kiehn---  last echo in epic 02-10-2021  moderate MR without stenosis   Osteoporosis    Wears dentures    full upper and lower partial   Wears hearing aid in left ear      No Known Allergies   Current Outpatient Medications  Medication Sig Dispense Refill   acetaminophen (TYLENOL) 500 MG tablet Take 500-1,000 mg by mouth every 6 (six) hours as needed for moderate pain.     alendronate (FOSAMAX) 70 MG tablet Take 70 mg by mouth once a week. Takes on Thursday's ;  Take with a full glass of water on an empty stomach.     apixaban (ELIQUIS) 5 MG TABS tablet Take 1 tablet (5 mg total) by mouth 2 (two)  times daily. 60 tablet 0   Ascorbic Acid (VITAMIN C) 1000 MG tablet Take 1,000 mg by mouth 2 (two) times daily.     atorvastatin (LIPITOR) 40 MG tablet Take 1 tablet (40 mg total) by mouth daily. 90 tablet 3   bacitracin-neomycin-polymyxin-hydrocortisone (CORTISPORIN) 1 % ointment Apply 1 application topically 2 (two) times daily. (Patient not taking: Reported on 04/19/2022) 15 g 3   calcium-vitamin D (OSCAL WITH D) 500-200 MG-UNIT tablet Take 1 tablet by mouth 2 (two) times daily.     digoxin (LANOXIN) 0.125 MG tablet Take 1 tablet (0.125 mg total) by mouth daily. 90 tablet 3   furosemide (LASIX) 40 MG tablet Take 40 mg daily.May take an additional dose for led swelling or weight gain 90 tablet 3   loperamide (IMODIUM A-D) 2 MG tablet Take 2 mg by mouth 4 (four) times daily as needed for diarrhea or loose stools. (Patient not taking: Reported on 04/19/2022)     Magnesium 250 MG TABS Take 250 mg by mouth daily.     metoprolol succinate (TOPROL-XL) 100 MG 24 hr tablet Take 1 tablet (100 mg total) by mouth daily. Take with or immediately following a meal. 90 tablet 3   Multiple Vitamins-Minerals (MULTIVITAMIN PO) Take 1 tablet by mouth daily.     omeprazole (PRILOSEC) 20 MG capsule Take 20 mg by mouth daily.     Polyethyl Glycol-Propyl Glycol (SYSTANE OP) Place 1 drop into both eyes daily as needed (dry eyes).     potassium chloride SA (KLOR-CON M) 20 MEQ tablet Take 1 tablet (20 mEq total) by mouth daily. 90 tablet 3   prochlorperazine (COMPAZINE) 10 MG tablet Take 1 tablet (10 mg total) by mouth every 6 (six) hours as needed for nausea or vomiting. (Patient not taking: Reported on 04/19/2022) 30 tablet 0   pseudoephedrine (SUDAFED) 30 MG tablet Take 30 mg by mouth daily as needed for congestion.     sildenafil (REVATIO) 20 MG tablet Take 20-60 mg by mouth daily as needed (erectile dysfunction).     tiZANidine (ZANAFLEX) 4 MG capsule Take 4 mg by mouth 2 (two) times daily as needed for muscle spasms.      No current facility-administered medications for this visit.     Past Surgical History:  Procedure Laterality Date   APPENDECTOMY     age 47   BROW LIFT AND BLEPHAROPLASTY Bilateral    APPROX. 2017   CATARACT EXTRACTION W/ INTRAOCULAR LENS IMPLANT Bilateral 2018   HEMORROIDECTOMY     2010 _0  and 2018 @ [place] Right 08/13/2017   Procedure: RIGHT HYDROCELECTOMY;  Surgeon: Cleon Gustin, MD;  Location: [facility];  Service: Urology;  Laterality: Right;   HYDROCELE EXCISION  Right 11/12/2017   Procedure: RIGHT HYDROCELECTOMY;  Surgeon: Cleon Gustin, MD;  Location: [facility];  Service: Urology;  Laterality: Right;   INGUINAL HERNIA REPAIR Bilateral    APPROX.   3825;   WITH UMBILICAL HERNIA AND EPIGRASTIC HERNIA REPAIRS   INGUINAL HERNIA REPAIR  09/11/2011   Procedure: HERNIA REPAIR INGUINAL ADULT;  Surgeon: Scherry Ran;  Location: [facility];  Service: General;  Laterality: Right;  Recurrent Right Inguinal Hernia Repair   MANDIBLE RECONSTRUCTION     x2  last one 1980;   from [facility]  ( has retained hardware)   MIDDLE EAR SURGERY Right 02/19/2015   by Dr Benjamine Mola ;  tympanomastoidectomy   ROTATOR CUFF REPAIR Left 03/31/2016   TRANSURETHRAL RESECTION OF BLADDER TUMOR N/A 06/17/2021   Procedure: TRANSURETHRAL RESECTION OF BLADDER TUMOR (TURBT) WITH POST OPERATIVE INSTILLATION OF GEMCITABINE;  Surgeon: Festus Aloe, MD;  Location: [place];  Service: Urology;  Laterality: N/A;   TRANSURETHRAL RESECTION OF BLADDER TUMOR N/A 08/16/2021   Procedure: TRANSURETHRAL RESECTION OF BLADDER TUMOR (TURBT);  Surgeon: Festus Aloe, MD;  [facility];  Service: Urology;  Laterality: N/A;   TYMPANOPLASTY Right 05/02/2016   Procedure: TYMPANOPLASTY;  Surgeon: Leta Baptist, MD;  Location: [place];  Service: ENT;  Laterality: Right;   UMBILICAL HERNIA REPAIR  2017     No Known Allergies    Family History   Problem Relation Age of Onset   Aortic aneurysm Other        Deceased   Cancer Other        Deceased   Cardiomyopathy Other        Alive   Anesthesia problems Neg Hx    Hypotension Neg Hx    Malignant hyperthermia Neg Hx    Pseudochol deficiency Neg Hx      Social History Mr. Malek reports that he quit smoking about 14 years ago. His smoking use included cigarettes. He has a 21.00 pack-year smoking history. He quit smokeless tobacco use about 15 years ago. Mr. Wojtaszek reports that he does not currently use alcohol.   Review of Systems CONSTITUTIONAL: No weight loss, fever, chills, weakness or fatigue.  HEENT: Eyes: No visual loss, blurred vision, double vision or yellow sclerae.No hearing loss, sneezing, congestion, runny nose or sore throat.  SKIN: No rash or itching.  CARDIOVASCULAR: per hpi RESPIRATORY: No shortness of breath, cough or sputum.  GASTROINTESTINAL: No anorexia, nausea, vomiting or diarrhea. No abdominal pain or blood.  GENITOURINARY: No burning on urination, no polyuria NEUROLOGICAL: No headache, dizziness, syncope, paralysis, ataxia, numbness or tingling in the extremities. No change in bowel or bladder control.  MUSCULOSKELETAL: No muscle, back pain, joint pain or stiffness.  LYMPHATICS: No enlarged nodes. No history of splenectomy.  PSYCHIATRIC: No history of depression or anxiety.  ENDOCRINOLOGIC: No reports of sweating, cold or heat intolerance. No polyuria or polydipsia.  [name] Kitchen   Physical Examination Today's Vitals   07/05/22 1311  BP: 118/76  Pulse: 64  SpO2: 98%  Weight: 195 lb 3.2 oz (88.5 kg)  Height: 6' (1.829 m)   Body mass index is 26.47 kg/m.  Gen: resting comfortably, no acute distress HEENT: no scleral icterus, pupils equal round and reactive, no palptable cervical adenopathy,  CV: irreg, no m/r/g, no jvd Resp: Clear to auscultation bilaterally GI: abdomen is soft, non-tender, non-distended, normal bowel sounds, no  hepatosplenomegaly MSK: extremities are warm, no edema.  Skin: warm, no rash Neuro:  no  focal deficits Psych: appropriate affect   Diagnostic Studies  11/2017 echo Study Conclusions   - Left ventricle: The cavity size was mildly dilated. Wall   thickness was increased in a pattern of mild LVH. Systolic   function was normal. The estimated ejection fraction was in the   range of 50% to 55%. Although no diagnostic regional wall motion   abnormality was identified, this possibility cannot be completely   excluded on the basis of this study. The study was not   technically sufficient to allow evaluation of LV diastolic   dysfunction due to atrial fibrillation. - Aortic valve: Moderately calcified annulus. Trileaflet. - Mitral valve: Mildly thickened leaflets . Systolic bowing without   prolapse. There was moderate regurgitation. - Left atrium: The atrium was mildly dilated. - Right ventricle: The cavity size was mildly dilated. - Right atrium: The atrium was moderately dilated. Central venous   pressure (est): 8 mm Hg. - Atrial septum: No defect or patent foramen ovale was identified. - Tricuspid valve: There was mild-moderate regurgitation. - Pulmonary arteries: PA peak pressure: 29 mm Hg (S). - Pericardium, extracardiac: A small pericardial effusion was   identified anterior to the heart.       09/2021 Zio patch 14 day monitor Patient was in afib throughout the study, range 64-213 bpm, average HR 110 Rare ventricular ectopy in the form of isolated PVCs, couplets, triplets. Rare short episodes of wide complex tachycardia that appear more consistent with SVT with aberrancy as opposed to NSVT No symptoms reported Overall 100% afib burden with very high heart rates at times.     Patch Wear Time:  14 days and 0 hours (2022-12-06T07:52:31-0500 to 2022-12-20T07:52:35-0500)   7 Ventricular Tachycardia runs occurred, the run with the fastest interval lasting 4 beats with a max rate  of 197 bpm, the longest lasting 11 beats with an avg rate of 145 bpm. Atrial Fibrillation occurred continuously (100% burden), ranging from 64-213  bpm (avg of 110 bpm). Intermittent Bundle Gustave Lindeman Block was present. Isolated VEs were rare (<1.0%, 14535), VE Couplets were rare (<1.0%, 1056), and VE Triplets were rare (<1.0%, 13). Ventricular Bigeminy was present. MD notification criteria for Rapid  Atrial Fibrillation met - report posted prior to notification per account request (ES).   11/2021 echo 1. Left ventricular ejection fraction, by estimation, is 35 to 40%. The  left ventricle has moderately decreased function. The left ventricle  demonstrates global hypokinesis. There is moderate left ventricular  hypertrophy. Left ventricular diastolic  parameters are indeterminate.   2. Right ventricular systolic function is moderately reduced. The right  ventricular size is severely enlarged. There is mildly elevated pulmonary  artery systolic pressure.   3. Left atrial size was severely dilated.   4. Right atrial size was severely dilated.   5. The mitral valve is normal in structure. Mild mitral valve  regurgitation. No evidence of mitral stenosis.   6. The tricuspid valve is abnormal. Tricuspid valve regurgitation is  moderate to severe.   7. The aortic valve is tricuspid. Aortic valve regurgitation is not  visualized. No aortic stenosis is present.   8. The inferior vena cava is dilated in size with <50% respiratory  variability, suggesting right atrial pressure of 15 mmHg.    05/2022 echo 1. Left ventricular ejection fraction, by estimation, is 60 to 65%. The  left ventricle has normal function. The left ventricle has no regional  wall motion abnormalities. The average left ventricular global  longitudinal strain is -17.7 %.  The global  longitudinal strain is normal.   2. Right ventricular systolic function is normal. The right ventricular  size is normal.   3. The aortic valve is  tricuspid.   4. Limited echo to evaluate LV function    Assessment and Plan   1. Persistent Afib - did not tolerate toprol 150 due to low bp's, back on 11m daily - tolerating eliquis now (previously had hematuria, pancytopenia for bladder cancer which he has completed treatment).  - severe LAE with LAVI in 60s - Difficultly rate controlling due to bp's, rates look to be improved since digoxin added by EP.   - continues to do well on toprol and digoxin, continue current meds     2. 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 Preoperative evaluatin - considering gall bladder surgery - no active acute cardiac conditions - tolerates greater than 4 METs regular without symptoms - recommend proceeding with gallbladder surgery from cardiac standpiont. Can hold eliquis 2 days prior, resume day after.      JArnoldo Lenis M.D.

## 2022-07-06 ENCOUNTER — Telehealth: Payer: Self-pay | Admitting: Oncology

## 2022-07-06 NOTE — Telephone Encounter (Signed)
Called patient regarding upcoming October appointments, patient is notified.  

## 2022-07-10 DIAGNOSIS — I10 Essential (primary) hypertension: Secondary | ICD-10-CM | POA: Diagnosis not present

## 2022-07-10 DIAGNOSIS — D696 Thrombocytopenia, unspecified: Secondary | ICD-10-CM | POA: Diagnosis not present

## 2022-07-10 DIAGNOSIS — Z299 Encounter for prophylactic measures, unspecified: Secondary | ICD-10-CM | POA: Diagnosis not present

## 2022-07-11 DIAGNOSIS — H7011 Chronic mastoiditis, right ear: Secondary | ICD-10-CM | POA: Diagnosis not present

## 2022-07-17 DIAGNOSIS — D61818 Other pancytopenia: Secondary | ICD-10-CM | POA: Diagnosis not present

## 2022-07-17 DIAGNOSIS — K824 Cholesterolosis of gallbladder: Secondary | ICD-10-CM | POA: Diagnosis not present

## 2022-07-18 DIAGNOSIS — J449 Chronic obstructive pulmonary disease, unspecified: Secondary | ICD-10-CM | POA: Diagnosis not present

## 2022-07-18 DIAGNOSIS — K31A19 Gastric intestinal metaplasia without dysplasia, unspecified site: Secondary | ICD-10-CM | POA: Diagnosis not present

## 2022-07-18 DIAGNOSIS — I4891 Unspecified atrial fibrillation: Secondary | ICD-10-CM | POA: Diagnosis not present

## 2022-07-18 DIAGNOSIS — K811 Chronic cholecystitis: Secondary | ICD-10-CM | POA: Diagnosis not present

## 2022-07-18 DIAGNOSIS — K824 Cholesterolosis of gallbladder: Secondary | ICD-10-CM | POA: Diagnosis not present

## 2022-07-18 DIAGNOSIS — I10 Essential (primary) hypertension: Secondary | ICD-10-CM | POA: Diagnosis not present

## 2022-07-18 DIAGNOSIS — Z79899 Other long term (current) drug therapy: Secondary | ICD-10-CM | POA: Diagnosis not present

## 2022-07-18 DIAGNOSIS — Z9049 Acquired absence of other specified parts of digestive tract: Secondary | ICD-10-CM | POA: Diagnosis not present

## 2022-07-18 DIAGNOSIS — Z7901 Long term (current) use of anticoagulants: Secondary | ICD-10-CM | POA: Diagnosis not present

## 2022-07-18 DIAGNOSIS — K219 Gastro-esophageal reflux disease without esophagitis: Secondary | ICD-10-CM | POA: Diagnosis not present

## 2022-07-18 DIAGNOSIS — F419 Anxiety disorder, unspecified: Secondary | ICD-10-CM | POA: Diagnosis not present

## 2022-07-21 ENCOUNTER — Inpatient Hospital Stay: Payer: Medicare Other

## 2022-07-28 ENCOUNTER — Ambulatory Visit: Payer: Medicare Other | Admitting: Oncology

## 2022-07-28 IMAGING — DX DG CHEST 2V
2 series · 2 of 2 positions shown · non-contrast
Comparison: None.

CLINICAL DATA: Shortness of breath.

EXAM:
CHEST - 2 VIEW

[chest pa]
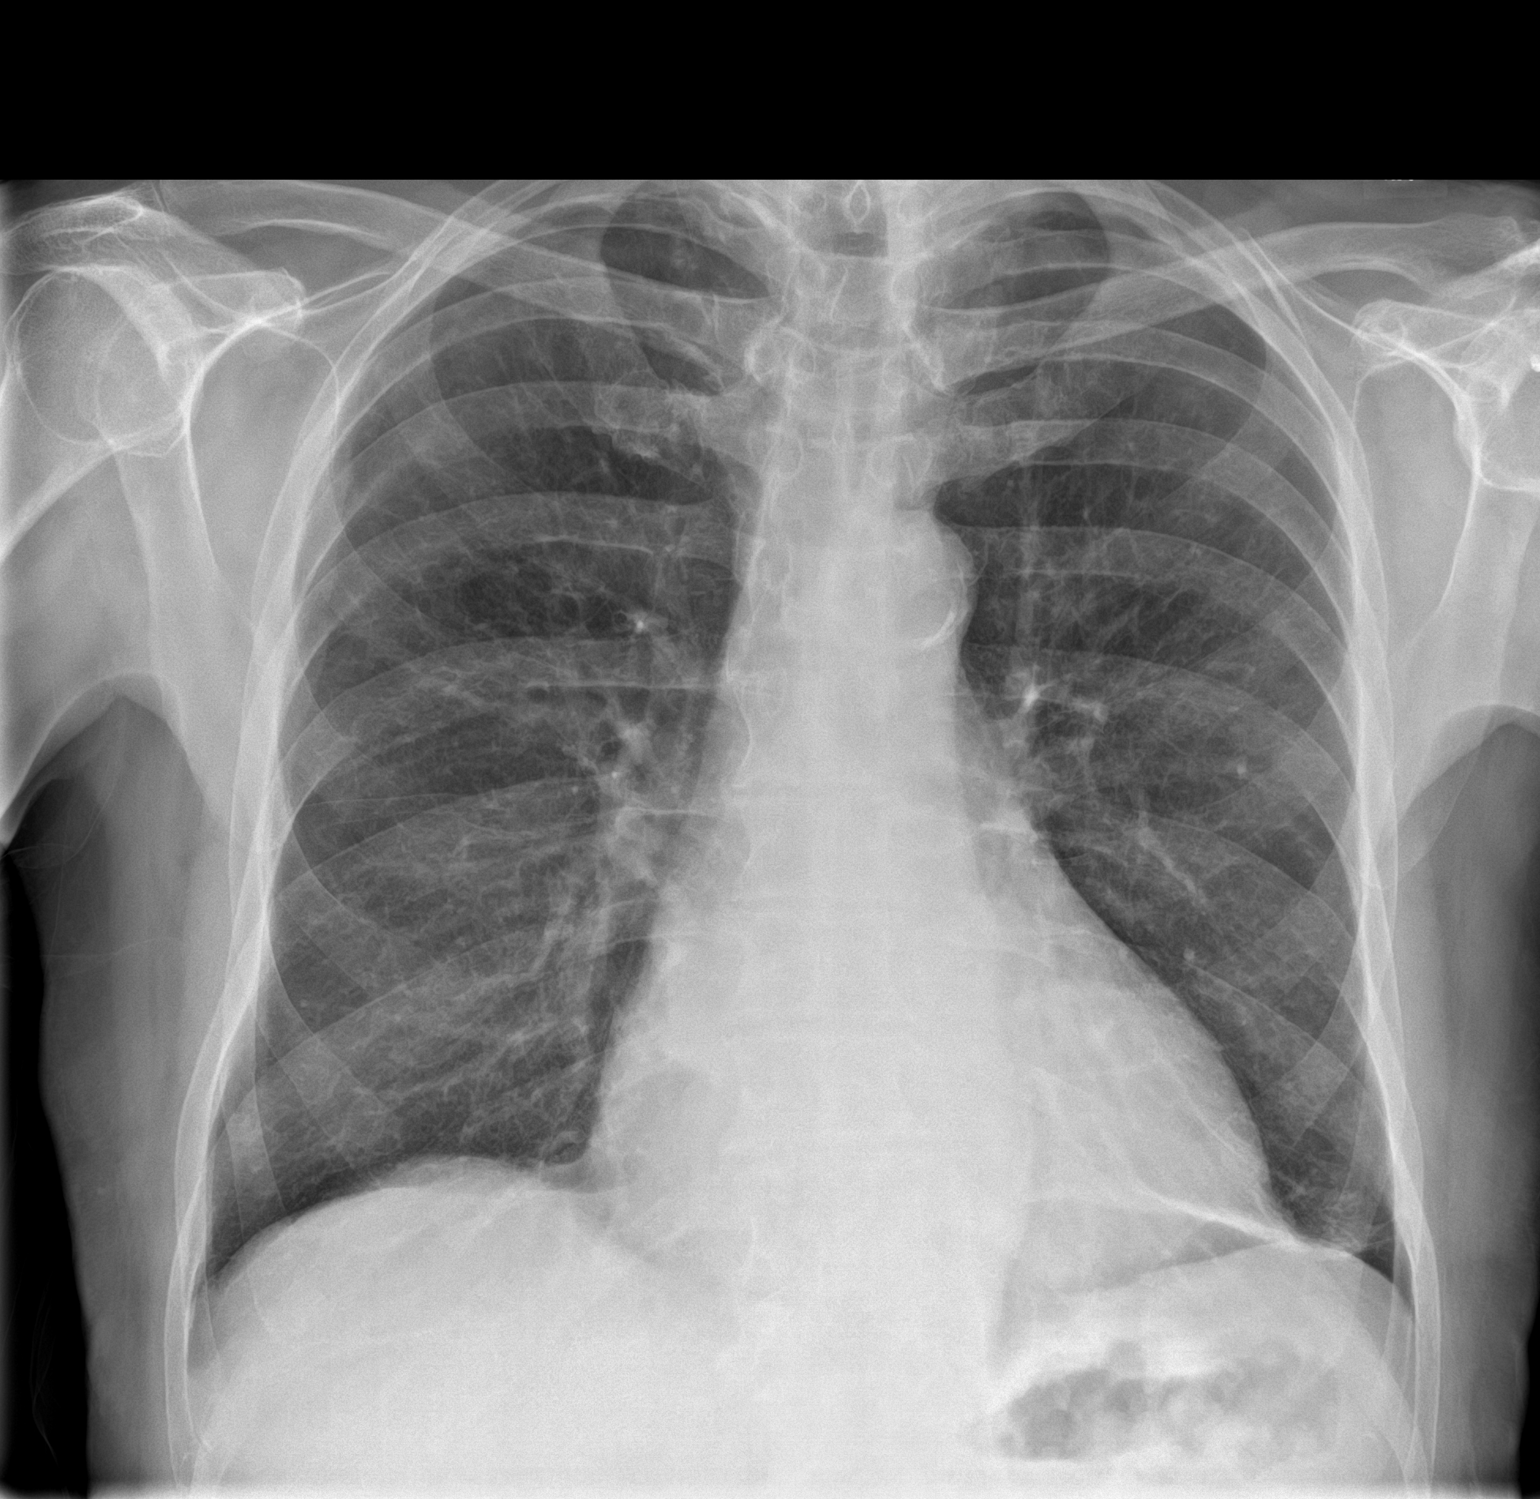

[chest lat]
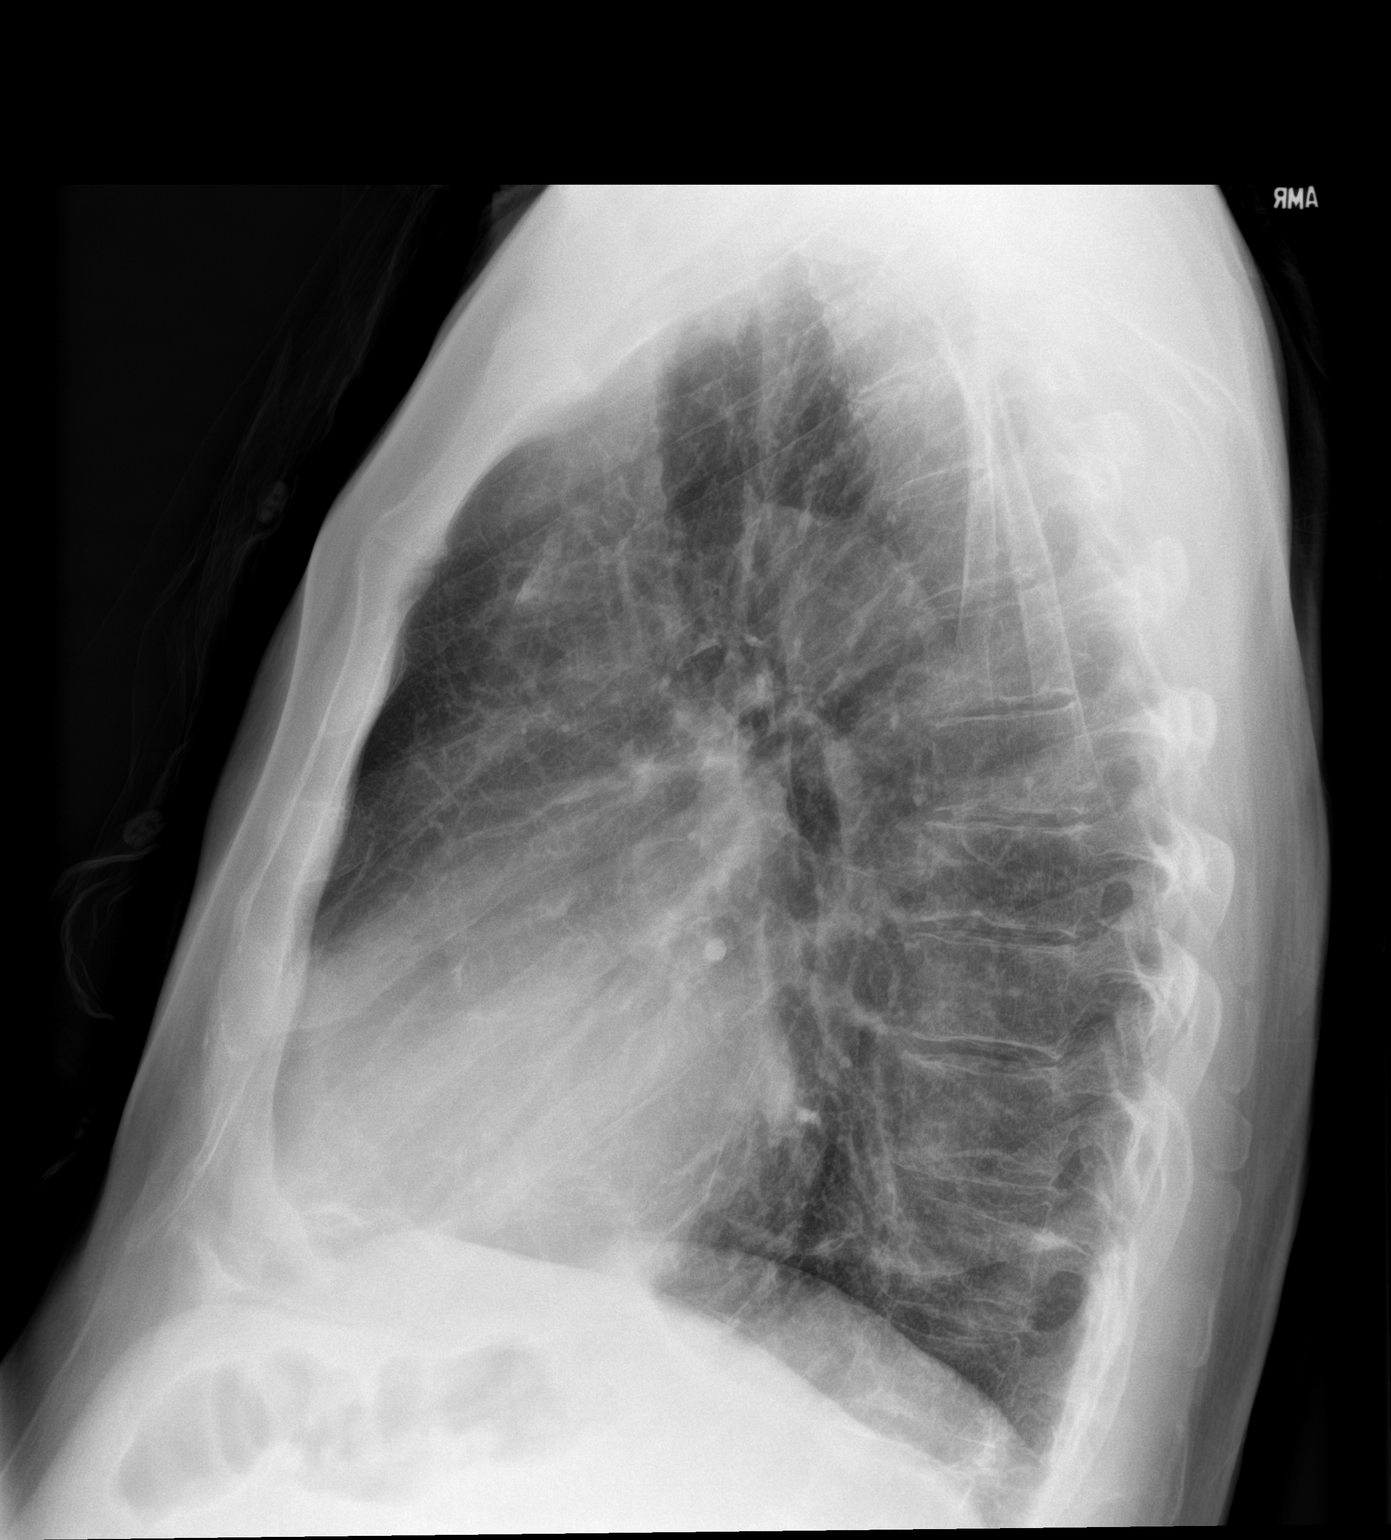

[2 of 2 positions shown; findings below may reference images not displayed]

FINDINGS: Mild, diffuse, chronic appearing increased lung markings are seen.
Mild linear scarring and/or atelectasis is noted within the left
lung base. There is no evidence of acute infiltrate, pleural
effusion or pneumothorax. The heart size and mediastinal contours
are within normal limits. There is mild calcification of the aortic
arch. The visualized skeletal structures are unremarkable.
IMPRESSION: Chronic appearing increased lung markings with mild left basilar
linear scarring and/or atelectasis.

## 2022-07-31 DIAGNOSIS — Z8551 Personal history of malignant neoplasm of bladder: Secondary | ICD-10-CM | POA: Diagnosis not present

## 2022-08-14 DIAGNOSIS — I4891 Unspecified atrial fibrillation: Secondary | ICD-10-CM | POA: Diagnosis not present

## 2022-08-14 DIAGNOSIS — J069 Acute upper respiratory infection, unspecified: Secondary | ICD-10-CM | POA: Diagnosis not present

## 2022-08-14 DIAGNOSIS — I700 Atherosclerosis of aorta: Secondary | ICD-10-CM | POA: Diagnosis not present

## 2022-08-14 DIAGNOSIS — J449 Chronic obstructive pulmonary disease, unspecified: Secondary | ICD-10-CM | POA: Diagnosis not present

## 2022-08-22 ENCOUNTER — Ambulatory Visit (HOSPITAL_COMMUNITY)
Admission: RE | Admit: 2022-08-22 | Discharge: 2022-08-22 | Disposition: A | Discharge status: Home / Self Care | Payer: Medicare Other | Source: Ambulatory Visit | Attending: Oncology | Admitting: Oncology

## 2022-08-22 ENCOUNTER — Inpatient Hospital Stay: Payer: Medicare Other | Attending: Oncology

## 2022-08-22 ENCOUNTER — Other Ambulatory Visit: Payer: Self-pay

## 2022-08-22 DIAGNOSIS — C7989 Secondary malignant neoplasm of other specified sites: Secondary | ICD-10-CM | POA: Diagnosis not present

## 2022-08-22 DIAGNOSIS — N200 Calculus of kidney: Secondary | ICD-10-CM | POA: Diagnosis not present

## 2022-08-22 DIAGNOSIS — K5730 Diverticulosis of large intestine without perforation or abscess without bleeding: Secondary | ICD-10-CM | POA: Diagnosis not present

## 2022-08-22 DIAGNOSIS — C679 Malignant neoplasm of bladder, unspecified: Secondary | ICD-10-CM

## 2022-08-22 DIAGNOSIS — C675 Malignant neoplasm of bladder neck: Secondary | ICD-10-CM | POA: Insufficient documentation

## 2022-08-22 LAB — CMP (CANCER CENTER ONLY)
ALT: 40 U/L (ref 0–44)
AST: 30 U/L (ref 15–41)
Albumin: 4.2 g/dL (ref 3.5–5.0)
Alkaline Phosphatase: 66 U/L (ref 38–126)
Anion gap: 7 (ref 5–15)
BUN: 29 mg/dL — ABNORMAL HIGH (ref 8–23)
CO2: 30 mmol/L (ref 22–32)
Calcium: 9 mg/dL (ref 8.9–10.3)
Chloride: 105 mmol/L (ref 98–111)
Creatinine: 1.11 mg/dL (ref 0.61–1.24)
GFR, Estimated: >60 mL/min (ref >60)
Glucose, Bld: 81 mg/dL (ref 70–99)
Potassium: 4.2 mmol/L (ref 3.5–5.1)
Sodium: 142 mmol/L (ref 135–145)
Total Bilirubin: 0.9 mg/dL (ref 0.3–1.2)
Total Protein: 7.2 g/dL (ref 6.5–8.1)

## 2022-08-22 LAB — CBC WITH DIFFERENTIAL (CANCER CENTER ONLY)
Abs Immature Granulocytes: 0.06 10*3/uL (ref 0.00–0.07)
Basophils Absolute: 0 10*3/uL (ref 0.0–0.1)
Basophils Relative: 0 %
Eosinophils Absolute: 0.1 10*3/uL (ref 0.0–0.5)
Eosinophils Relative: 1 %
HCT: 43.1 % (ref 39.0–52.0)
Hemoglobin: 14.7 g/dL (ref 13.0–17.0)
Immature Granulocytes: 1 %
Lymphocytes Relative: 19 %
Lymphs Abs: 1.5 10*3/uL (ref 0.7–4.0)
MCH: 31.8 pg (ref 26.0–34.0)
MCHC: 34.1 g/dL (ref 30.0–36.0)
MCV: 93.3 fL (ref 80.0–100.0)
Monocytes Absolute: 0.7 10*3/uL (ref 0.1–1.0)
Monocytes Relative: 9 %
Neutro Abs: 5.8 10*3/uL (ref 1.7–7.7)
Neutrophils Relative %: 70 %
Platelet Count: 134 10*3/uL — ABNORMAL LOW (ref 150–400)
RBC: 4.62 MIL/uL (ref 4.22–5.81)
RDW: 14.1 % (ref 11.5–15.5)
WBC Count: 8.3 10*3/uL (ref 4.0–10.5)
nRBC: 0 % (ref 0.0–0.2)

## 2022-08-23 DIAGNOSIS — I10 Essential (primary) hypertension: Secondary | ICD-10-CM | POA: Diagnosis not present

## 2022-08-23 DIAGNOSIS — R52 Pain, unspecified: Secondary | ICD-10-CM | POA: Diagnosis not present

## 2022-08-23 DIAGNOSIS — M25552 Pain in left hip: Secondary | ICD-10-CM | POA: Diagnosis not present

## 2022-08-23 DIAGNOSIS — Z299 Encounter for prophylactic measures, unspecified: Secondary | ICD-10-CM | POA: Diagnosis not present

## 2022-08-23 DIAGNOSIS — M25551 Pain in right hip: Secondary | ICD-10-CM | POA: Diagnosis not present

## 2022-08-24 DIAGNOSIS — H40053 Ocular hypertension, bilateral: Secondary | ICD-10-CM | POA: Diagnosis not present

## 2022-08-24 DIAGNOSIS — H40013 Open angle with borderline findings, low risk, bilateral: Secondary | ICD-10-CM | POA: Diagnosis not present

## 2022-08-24 DIAGNOSIS — H43813 Vitreous degeneration, bilateral: Secondary | ICD-10-CM | POA: Diagnosis not present

## 2022-08-24 DIAGNOSIS — H35373 Puckering of macula, bilateral: Secondary | ICD-10-CM | POA: Diagnosis not present

## 2022-08-28 ENCOUNTER — Encounter (INDEPENDENT_AMBULATORY_CARE_PROVIDER_SITE_OTHER): Payer: Self-pay | Admitting: Gastroenterology

## 2022-08-29 ENCOUNTER — Inpatient Hospital Stay (HOSPITAL_BASED_OUTPATIENT_CLINIC_OR_DEPARTMENT_OTHER): Payer: Medicare Other | Admitting: Oncology

## 2022-08-29 ENCOUNTER — Other Ambulatory Visit: Payer: Self-pay

## 2022-08-29 VITALS — BP 129/74 | HR 62 | Temp 98.0°F | Resp 15 | Wt 197.2 lb

## 2022-08-29 DIAGNOSIS — C675 Malignant neoplasm of bladder neck: Secondary | ICD-10-CM | POA: Diagnosis not present

## 2022-08-29 DIAGNOSIS — C7989 Secondary malignant neoplasm of other specified sites: Secondary | ICD-10-CM | POA: Diagnosis not present

## 2022-08-29 DIAGNOSIS — C679 Malignant neoplasm of bladder, unspecified: Secondary | ICD-10-CM

## 2022-08-29 NOTE — Progress Notes (Signed)
Hematology and Oncology Follow Up Visit  Kenneth [name] [id number] 1950-12-31 71 y.o. 08/29/2022 2:54 PM Kenneth [name], MDVyas, Kenneth Hatcher, MD   Principle Diagnosis: 71 year old with bladder cancer diagnosed in September 2022.  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   Prior Therapy: He is status post TURBT on June 17, 2021. 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    Definitive therapy with radiation and weekly cisplatin.  He completed therapy in December 2022.  Current therapy: Active surveillance.  Interim History: Kenneth [name] returns today for a follow-up.  Since last visit, he reports no major changes in his health.  He denies any residual complications related to chemotherapy or radiation.  He denies any hematuria, dysuria or excessive fatigue.  He denies any tiredness or hospitalizations.  His performance status quality of life remains unchanged.    Medications: Reviewed without changes. Current Outpatient Medications  Medication Sig Dispense Refill   acetaminophen (TYLENOL) 500 MG tablet Take 500-1,000 mg by mouth every 6 (six) hours as needed for moderate pain.     alendronate (FOSAMAX) 70 MG tablet Take 70 mg by mouth once a week. Takes on Thursday's ;  Take with a full glass of water on an empty stomach.     apixaban (ELIQUIS) 5 MG TABS tablet Take 1 tablet (5 mg total) by mouth 2 (two) times daily. 60 tablet 0   Ascorbic Acid (VITAMIN C) 1000 MG tablet Take 1,000 mg by mouth 2 (two) times daily.     atorvastatin (LIPITOR) 40 MG tablet Take 1 tablet (40 mg total) by mouth daily. 90 tablet 3   bacitracin-neomycin-polymyxin-hydrocortisone (CORTISPORIN) 1 % ointment Apply 1 application topically 2 (two) times daily. (Patient not taking: Reported on 04/19/2022) 15 g 3   calcium-vitamin D (OSCAL WITH D)  500-200 MG-UNIT tablet Take 1 tablet by mouth 2 (two) times daily.     digoxin (LANOXIN) 0.125 MG tablet Take 1 tablet (0.125 mg total) by mouth daily. 90 tablet 3   furosemide (LASIX) 40 MG tablet Take 40 mg daily.May take an additional dose for led swelling or weight gain 90 tablet 3   loperamide (IMODIUM A-D) 2 MG tablet Take 2 mg by mouth 4 (four) times daily as needed for diarrhea or loose stools.     Magnesium 250 MG TABS Take 250 mg by mouth daily. (Patient not taking: Reported on 07/05/2022)     metoprolol succinate (TOPROL-XL) 100 MG 24 hr tablet Take 1 tablet (100 mg total) by mouth daily. Take with or immediately following a meal. 90 tablet 3   Multiple Vitamins-Minerals (MULTIVITAMIN PO) Take 1 tablet by mouth daily.     omeprazole (PRILOSEC) 20 MG capsule Take 20 mg by mouth daily.     Polyethyl Glycol-Propyl Glycol (SYSTANE OP) Place 1 drop into both eyes daily as needed (dry eyes).     potassium chloride SA (KLOR-CON M) 20 MEQ tablet Take 1 tablet (20 mEq total) by mouth daily. 90 tablet 3   prochlorperazine (COMPAZINE) 10 MG tablet Take 1 tablet (10 mg total) by mouth every 6 (six) hours as needed for nausea or vomiting. (Patient not taking: Reported on 04/19/2022) 30 tablet 0   pseudoephedrine (SUDAFED) 30 MG tablet Take 30 mg by mouth daily as needed for congestion.     sildenafil (REVATIO) 20 MG tablet Take 20-60  mg by mouth daily as needed (erectile dysfunction).     tiZANidine (ZANAFLEX) 4 MG capsule Take 4 mg by mouth 2 (two) times daily as needed for muscle spasms.     No current facility-administered medications for this visit.     Allergies: No Known Allergies    Physical Exam:   Blood pressure 129/74, pulse 62, temperature 98 F (36.7 C), temperature source Temporal, resp. rate 15, weight 197 lb 3.2 oz (89.4 kg), SpO2 98 %.     ECOG: 1    General appearance: Comfortable appearing without any discomfort Head: Normocephalic without any trauma Oropharynx:  Mucous membranes are moist and pink without any thrush or ulcers. Eyes: Pupils are equal and round reactive to light. Lymph nodes: No cervical, supraclavicular, inguinal or axillary lymphadenopathy.   Heart:regular rate and rhythm.  S1 and S2 without leg edema. Lung: Clear without any rhonchi or wheezes.  No dullness to percussion. Abdomin: Soft, nontender, nondistended with good bowel sounds.  No hepatosplenomegaly. Musculoskeletal: No joint deformity or effusion.  Full range of motion noted. Neurological: No deficits noted on motor, sensory and deep tendon reflex exam. Skin: No petechial rash or dryness.  Appeared moist.         Lab Results: Lab Results  Component Value Date   WBC 8.3 08/22/2022   HGB 14.7 08/22/2022   HCT 43.1 08/22/2022   MCV 93.3 08/22/2022   PLT 134 (L) 08/22/2022     Chemistry      Component Value Date/Time   NA 142 08/22/2022 1354   K 4.2 08/22/2022 1354   CL 105 08/22/2022 1354   CO2 30 08/22/2022 1354   BUN 29 (H) 08/22/2022 1354   CREATININE 1.11 08/22/2022 1354   CREATININE 1.16 12/02/2021 1024      Component Value Date/Time   CALCIUM 9.0 08/22/2022 1354   ALKPHOS 66 08/22/2022 1354   AST 30 08/22/2022 1354   ALT 40 08/22/2022 1354   BILITOT 0.9 08/22/2022 1354      CT ABDOMEN PELVIS WO CONTRAST (Accession [id number]) (Order [id number]) Imaging Date: 08/22/2022 Department: [facility]-CT IMAGING Released By: Malena Edman Authorizing: Wyatt Portela, MD   Exam Status  Status  Final [99]   PACS Intelerad Image Link   Show images for CT ABDOMEN PELVIS WO CONTRAST Study Result  Narrative & Impression  CLINICAL DATA:  Bladder cancer, assess treatment response * Tracking Code: BO *   EXAM: CT ABDOMEN AND PELVIS WITHOUT CONTRAST   TECHNIQUE: Multidetector CT imaging of the abdomen and pelvis was performed following the standard protocol without IV contrast.   RADIATION DOSE REDUCTION: This exam was  performed according to the departmental dose-optimization program which includes automated exposure control, adjustment of the mA and/or kV according to patient size and/or use of iterative reconstruction technique.   COMPARISON:  05/05/2021   FINDINGS: Lower chest: No acute abnormality. Bandlike scarring or atelectasis of the left lung base. Small hiatal hernia.   Hepatobiliary: No focal liver abnormality is seen. Status post cholecystectomy. No biliary dilatation.   Pancreas: Unremarkable. No pancreatic ductal dilatation or surrounding inflammatory changes.   Spleen: Normal in size without significant abnormality.   Adrenals/Urinary Tract: Adrenal glands are unremarkable. Small nonobstructive calculus of the inferior pole of the left kidney. No right-sided calculi, ureteral calculi, or hydronephrosis. Simple, benign small bilateral renal cortical cysts, for which no further follow-up or characterization is required. Bladder is unremarkable in noncontrast appearance. A previously seen contrast enhancing nodule near  the bladder neck is not appreciated on noncontrast examination.   Stomach/Bowel: Stomach is within normal limits. Diverticulum of the descending duodenum. Appendix is not clearly visualized and may be surgically absent. Pancolonic diverticulosis. No evidence of bowel wall thickening, distention, or inflammatory changes.   Vascular/Lymphatic: Aortic atherosclerosis. No enlarged abdominal or pelvic lymph nodes.   Reproductive: No mass or other significant abnormality.   Other: Evidence of prior right inguinal hernia repair.  No ascites.   Musculoskeletal: No acute or significant osseous findings.   IMPRESSION: 1. Bladder is unremarkable in noncontrast appearance. A previously seen contrast enhancing nodule near the bladder neck is not appreciated on noncontrast examination, presumably resected by TURBT. 2. No noncontrast evidence of lymphadenopathy or  metastatic disease in the abdomen or pelvis. 3. Nonobstructive left nephrolithiasis. 4. Pancolonic diverticulosis without evidence of acute diverticulitis.   Aortic Atherosclerosis (ICD10-I70.0).     Impression and Plan:  71 year old with:  1.  Bladder cancer diagnosed in September 2022.  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     He continues to be on active surveillance without any evidence of relapse.  CT scan obtained on August 22, 2022 showed no residual disease at this time.  At this time, I have recommended continued active surveillance without any additional treatment unless he has advanced disease.  Systemic therapy will be used if he has relapsed disease.  He is up-to-date on cystoscopy as well and continues to follow with Dr. Junious Silk.     2.  Renal function surveillance: No residual complications related to platinum based therapy.   3.  Pancytopenia: Resolved after the conclusion of chemotherapy.  CBC on August 22, 2022 was nearly normalized.   4.  Follow-up: We will be as needed for medical oncology standpoint.  He will continue to follow-up with Dr. Junious Silk with surveillance cystoscopy   30  minutes were dedicated to this visit.  The time was spent on updating disease status, reviewing imaging studies, treatment choices and outlining future plan of care review.   Zola Button, MD 11/21/20232:54 PM

## 2022-09-07 IMAGING — CT CT CHEST LUNG CANCER SCREENING LOW DOSE W/O CM
1 series · 10 of 10 positions shown, 13 images · non-contrast
Comparison: Images from CT chest 04/18/2017 are not available time
of dictation. Report is reviewed.

CLINICAL DATA: Former smoker, quit 15 years ago, 28 pack-year
history.



[ct lung segmentation data · axial · 0.78mm/px · z∈[+1216,+1216]mm · 10 of 347 frames shown]
[frame 1/347  mediastinal]
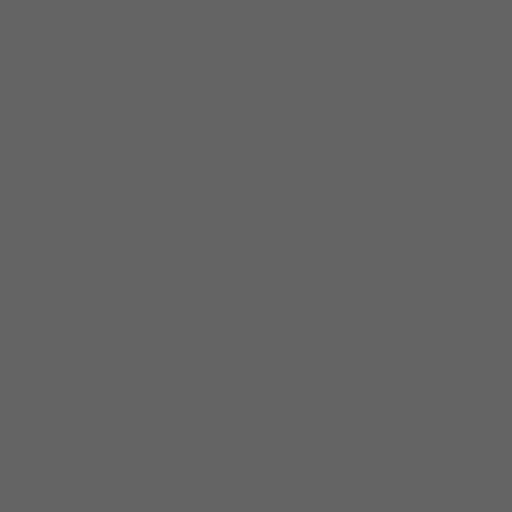
[frame 1/347  lung]
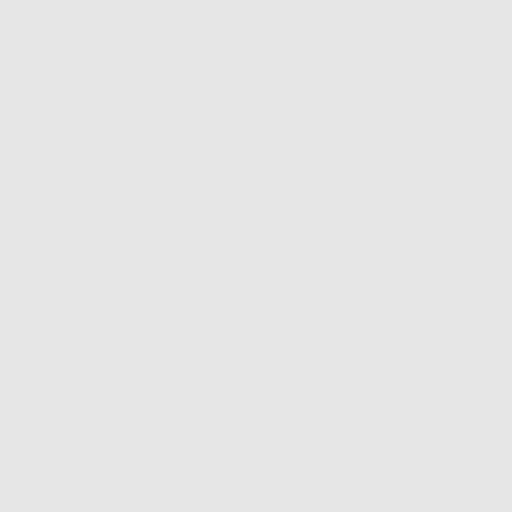
[frame 39/347  lung]
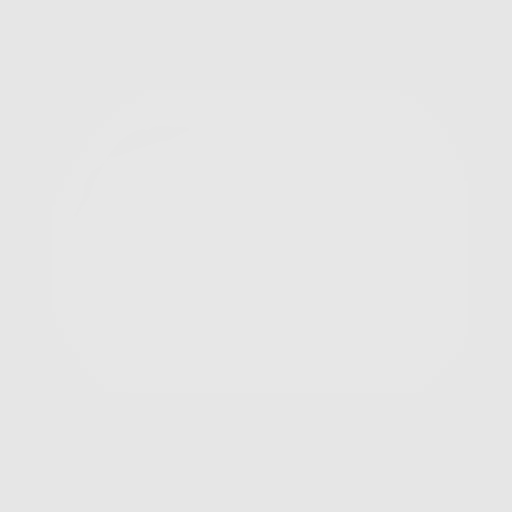
[frame 77/347  lung]
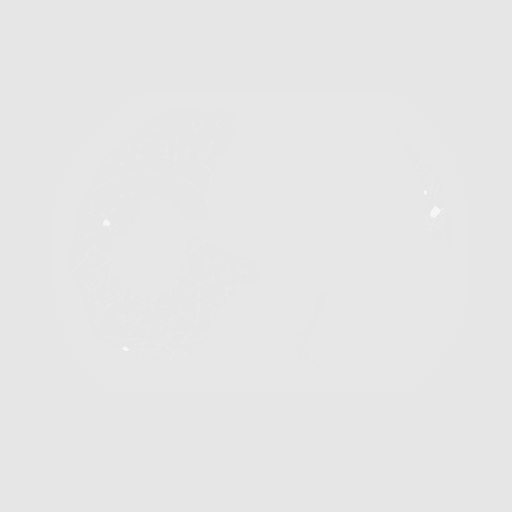
[frame 116/347  lung]
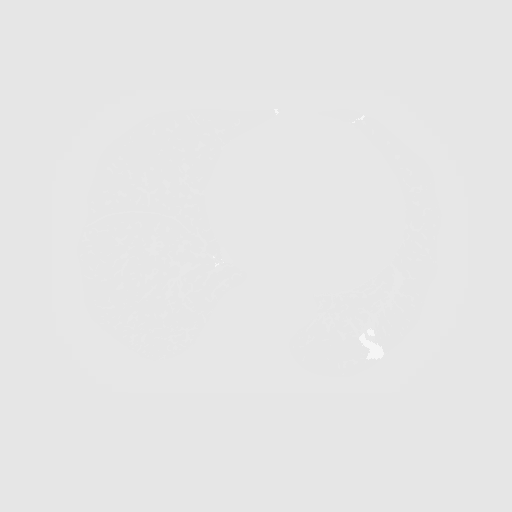
[frame 154/347  mediastinal]
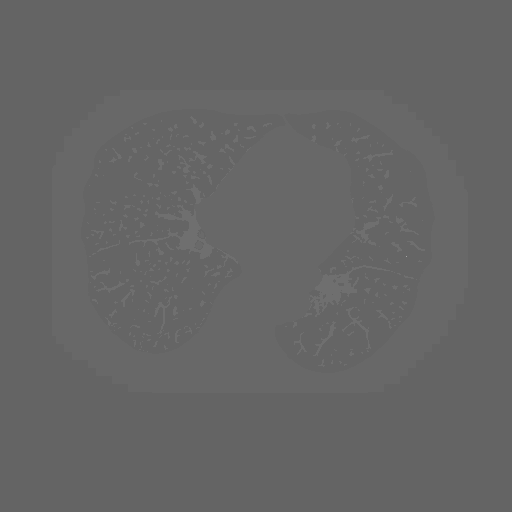
[frame 154/347  lung]
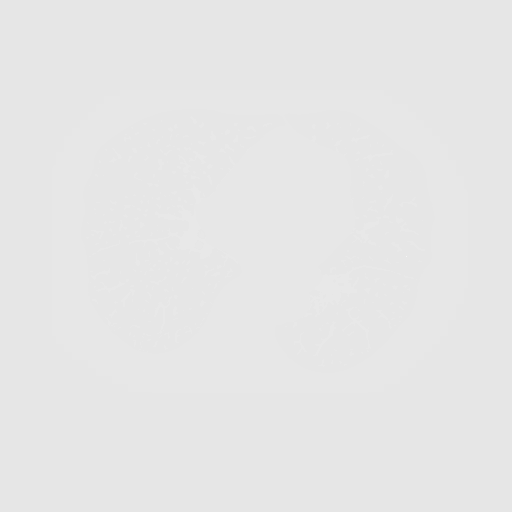
[frame 193/347  lung]
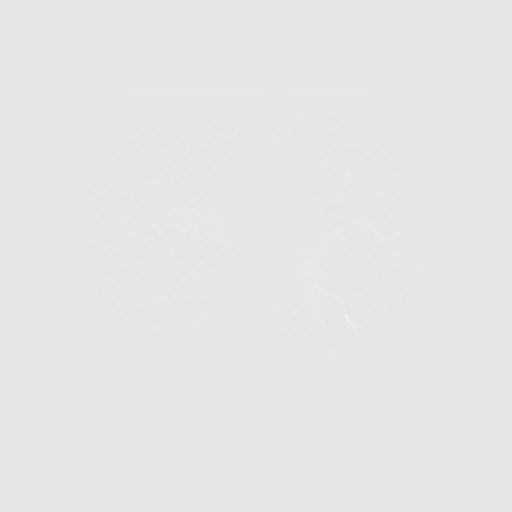
[frame 231/347  lung]
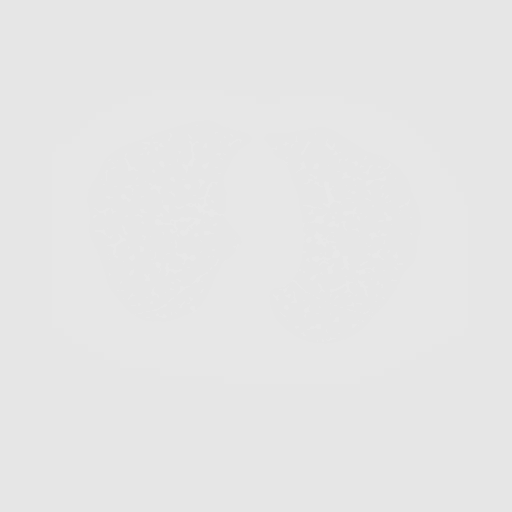
[frame 270/347  lung]
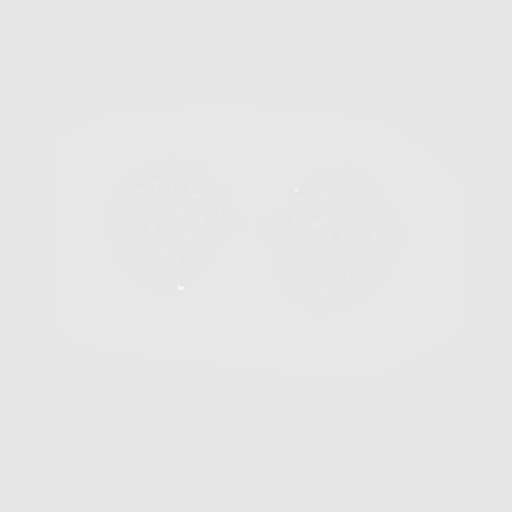
[frame 308/347  mediastinal]
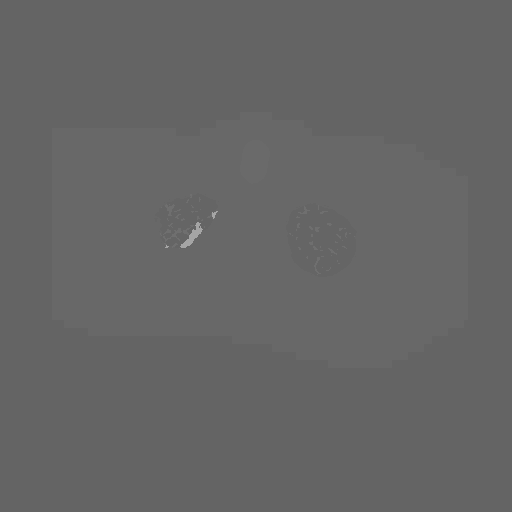
[frame 308/347  lung]
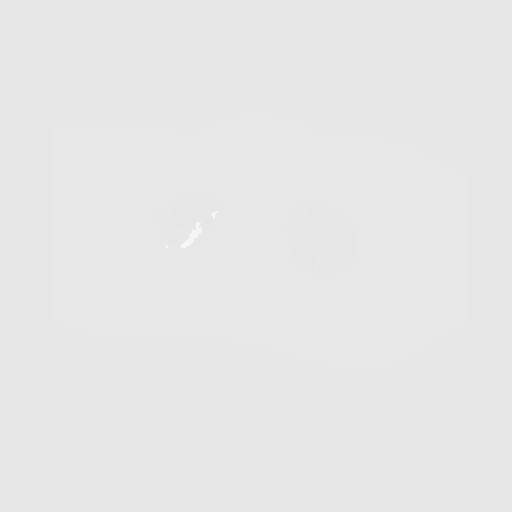
[frame 347/347  lung]
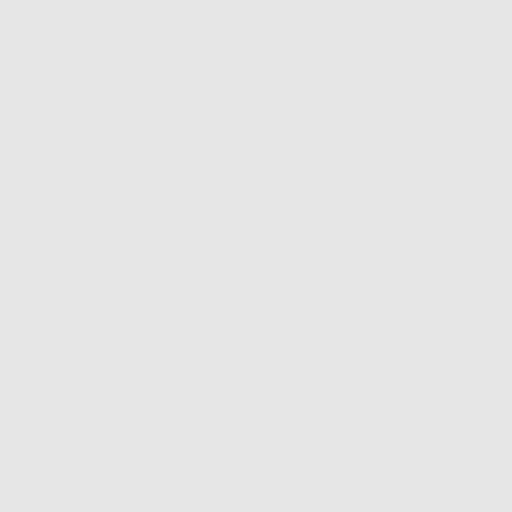

[10 of 10 positions shown; findings below may reference images not displayed]

FINDINGS: Cardiovascular: Atherosclerotic calcification of the aorta, aortic
valve and coronary arteries. Heart is enlarged. No pericardial
effusion.

Mediastinum/Nodes: Mediastinal lymph nodes are not enlarged by CT
size criteria. Hilar regions are difficult to definitively evaluate
without IV contrast. No axillary adenopathy. Esophagus is grossly
unremarkable.

Lungs/Pleura: Centrilobular and paraseptal emphysema. Bibasilar
scarring, left greater than right. Calcified granuloma. Pulmonary
nodules measure up to 5.9 mm in the right lower lobe. No pleural
fluid. Airway is unremarkable.

Upper Abdomen: Visualized portions of the liver, adrenal glands,
kidneys, spleen, pancreas, stomach and bowel are grossly
unremarkable.

Musculoskeletal: Degenerative changes in the spine. No worrisome
lytic or sclerotic lesions.
IMPRESSION: 1. Lung-RADS 2, benign appearance or behavior. Continue annual
screening with low-dose chest CT without contrast in 12 months.
2. Aortic atherosclerosis (6MD82-T33.3). Coronary artery
calcification.
3.  Emphysema (6MD82-S3W.0).

## 2022-09-25 DIAGNOSIS — R0981 Nasal congestion: Secondary | ICD-10-CM | POA: Diagnosis not present

## 2022-09-25 DIAGNOSIS — J069 Acute upper respiratory infection, unspecified: Secondary | ICD-10-CM | POA: Diagnosis not present

## 2022-11-07 ENCOUNTER — Other Ambulatory Visit: Payer: Self-pay | Admitting: Cardiology

## 2022-11-14 ENCOUNTER — Telehealth: Payer: Self-pay | Admitting: Cardiology

## 2022-11-14 DIAGNOSIS — B182 Chronic viral hepatitis C: Secondary | ICD-10-CM | POA: Diagnosis not present

## 2022-11-14 DIAGNOSIS — I10 Essential (primary) hypertension: Secondary | ICD-10-CM | POA: Diagnosis not present

## 2022-11-14 DIAGNOSIS — J449 Chronic obstructive pulmonary disease, unspecified: Secondary | ICD-10-CM | POA: Diagnosis not present

## 2022-11-14 DIAGNOSIS — Z79899 Other long term (current) drug therapy: Secondary | ICD-10-CM | POA: Diagnosis not present

## 2022-11-14 DIAGNOSIS — R5383 Other fatigue: Secondary | ICD-10-CM | POA: Diagnosis not present

## 2022-11-14 DIAGNOSIS — C679 Malignant neoplasm of bladder, unspecified: Secondary | ICD-10-CM | POA: Diagnosis not present

## 2022-11-14 DIAGNOSIS — Z299 Encounter for prophylactic measures, unspecified: Secondary | ICD-10-CM | POA: Diagnosis not present

## 2022-11-14 DIAGNOSIS — H7011 Chronic mastoiditis, right ear: Secondary | ICD-10-CM | POA: Diagnosis not present

## 2022-11-14 NOTE — Telephone Encounter (Signed)
Patient called in to say that his PCP thinks he might have a blockage. Calling to see if he schd get a procedure schedule. Please advise

## 2022-11-15 ENCOUNTER — Encounter: Payer: Self-pay | Admitting: Cardiology

## 2022-11-15 ENCOUNTER — Encounter: Payer: Self-pay | Admitting: *Deleted

## 2022-11-15 NOTE — Telephone Encounter (Signed)
Left message for patient to call office to offer first available appointment with [facility].

## 2022-11-15 NOTE — Telephone Encounter (Signed)
Appointment arranged with Kenneth [name] 11/16/2022 '@9'$ :00 am Office notes received via fax today

## 2022-11-15 NOTE — Telephone Encounter (Signed)
Error

## 2022-11-16 ENCOUNTER — Encounter: Payer: Self-pay | Admitting: Nurse Practitioner

## 2022-11-16 ENCOUNTER — Encounter: Payer: Self-pay | Admitting: *Deleted

## 2022-11-16 ENCOUNTER — Ambulatory Visit: Payer: Medicare Other | Attending: Nurse Practitioner | Admitting: Nurse Practitioner

## 2022-11-16 VITALS — BP 120/72 | HR 67 | Ht 72.0 in | Wt 198.2 lb

## 2022-11-16 DIAGNOSIS — I5022 Chronic systolic (congestive) heart failure: Secondary | ICD-10-CM | POA: Insufficient documentation

## 2022-11-16 DIAGNOSIS — R5383 Other fatigue: Secondary | ICD-10-CM | POA: Insufficient documentation

## 2022-11-16 DIAGNOSIS — R55 Syncope and collapse: Secondary | ICD-10-CM | POA: Diagnosis not present

## 2022-11-16 DIAGNOSIS — I480 Paroxysmal atrial fibrillation: Secondary | ICD-10-CM

## 2022-11-16 DIAGNOSIS — R0609 Other forms of dyspnea: Secondary | ICD-10-CM | POA: Insufficient documentation

## 2022-11-16 DIAGNOSIS — I4819 Other persistent atrial fibrillation: Secondary | ICD-10-CM

## 2022-11-16 DIAGNOSIS — R531 Weakness: Secondary | ICD-10-CM | POA: Diagnosis not present

## 2022-11-16 MED ORDER — METOPROLOL TARTRATE 100 MG PO TABS: 100.0000 mg | ORAL_TABLET | Freq: Once | ORAL | 0 refills | Status: DC

## 2022-11-16 NOTE — Patient Instructions (Addendum)
Medication Instructions:  Continue all current medications.   Labwork: none  Testing/Procedures: Your physician has requested that you have an echocardiogram. Echocardiography is a painless test that uses sound waves to create images of your heart. It provides your doctor with information about the size and shape of your heart and how well your heart's chambers and valves are working. This procedure takes approximately one hour. There are no restrictions for this procedure. Please do NOT wear cologne, perfume, aftershave, or lotions (deodorant is allowed). Please arrive 15 minutes prior to your appointment time.  Office will contact with results via phone, letter or mychart.     Follow-Up: 4-6 weeks    Any Other Special Instructions Will Be Listed Below (If Applicable).   If you need a refill on your cardiac medications before your next appointment, please call your pharmacy.
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
## 2022-11-18 ENCOUNTER — Other Ambulatory Visit: Payer: Self-pay | Admitting: Physician Assistant

## 2022-11-21 ENCOUNTER — Telehealth (HOSPITAL_COMMUNITY): Payer: Self-pay | Admitting: *Deleted

## 2022-11-21 NOTE — Telephone Encounter (Signed)
Calling patient to cancel his CCTA due to his Atrial fibrillation. He verbalized understanding and is aware that the ordering provider is also aware of the cancellation.   Gordy Clement RN Navigator Cardiac [facility] [phone] Office [phone] Cell

## 2022-11-23 ENCOUNTER — Ambulatory Visit (HOSPITAL_COMMUNITY): Admission: RE | Admit: 2022-11-23 | Payer: Medicare Other | Source: Ambulatory Visit

## 2022-11-23 ENCOUNTER — Telehealth: Payer: Self-pay | Admitting: Nurse Practitioner

## 2022-11-23 ENCOUNTER — Encounter (HOSPITAL_COMMUNITY): Payer: Self-pay

## 2022-11-23 DIAGNOSIS — R0609 Other forms of dyspnea: Secondary | ICD-10-CM

## 2022-11-23 NOTE — Telephone Encounter (Signed)
Patient called stating his CT scan for today was cancelled because he is in A-fib all the time.  He wants to know what is going to be done next.

## 2022-11-23 NOTE — Telephone Encounter (Signed)
Called and discussed next steps regarding ischemic evaluation with patient. Stated I spoke directly with Dr. Carlyle Dolly who agreed that next step is Lexiscan. Went over risks and benefits with him and he verbalized understanding and is willing to proceed. Will cancel CCTA.   Finis Bud, NP

## 2022-11-23 NOTE — Telephone Encounter (Signed)
Provider will call.

## 2022-11-23 NOTE — Addendum Note (Signed)
Addended by: Finis Bud on: 11/23/2022 05:42 PM   Modules accepted: Orders

## 2022-11-24 ENCOUNTER — Telehealth: Payer: Self-pay | Admitting: Nurse Practitioner

## 2022-11-24 ENCOUNTER — Encounter: Payer: Self-pay | Admitting: *Deleted

## 2022-11-24 NOTE — Telephone Encounter (Signed)
Checking percert on the following patient for testing scheduled at [facility].    LEXISCAN   11/28/2022

## 2022-11-24 NOTE — Telephone Encounter (Signed)
Notified patient & informed him that instruction sheet will be sent to him via mychart.  Schedulers will reach out to give you date & time.

## 2022-11-24 NOTE — Addendum Note (Signed)
Addended by: Laurine Blazer on: 11/24/2022 11:30 AM   Modules accepted: Orders

## 2022-11-28 ENCOUNTER — Encounter (HOSPITAL_COMMUNITY)
Admission: RE | Admit: 2022-11-28 | Discharge: 2022-11-28 | Disposition: A | Discharge status: Home / Self Care | Payer: Medicare Other | Source: Ambulatory Visit | Attending: Nurse Practitioner | Admitting: Nurse Practitioner

## 2022-11-28 ENCOUNTER — Ambulatory Visit (HOSPITAL_COMMUNITY)
Admission: RE | Admit: 2022-11-28 | Discharge: 2022-11-28 | Disposition: A | Discharge status: Home / Self Care | Payer: Medicare Other | Source: Ambulatory Visit | Attending: Cardiology | Admitting: Cardiology

## 2022-11-28 DIAGNOSIS — R0609 Other forms of dyspnea: Secondary | ICD-10-CM | POA: Insufficient documentation

## 2022-11-28 LAB — NM MYOCAR MULTI W/SPECT W/WALL MOTION / EF
Base ST Depression (mm): 0 mm
LV dias vol: 113 mL (ref 62–150)
LV sys vol: 53 mL
Nuc Stress EF: 53 %
Peak HR: 86 {beats}/min
RATE: 0.4
Rest HR: 74 {beats}/min
Rest Nuclear Isotope Dose: 10.9 mCi
SDS: 0
SRS: 2
SSS: 2
ST Depression (mm): 0 mm
Stress Nuclear Isotope Dose: 31.3 mCi
TID: 1.08

## 2022-11-28 MED ORDER — TECHNETIUM TC 99M TETROFOSMIN IV KIT
10.9000 | PACK | Freq: Once | INTRAVENOUS | Status: AC | PRN
  Administered 2022-11-28: 10.9 via INTRAVENOUS

## 2022-11-28 MED ORDER — SODIUM CHLORIDE FLUSH 0.9 % IV SOLN
INTRAVENOUS | Status: AC
  Administered 2022-11-28: 10 mL via INTRAVENOUS
  Filled 2022-11-28: qty 10

## 2022-11-28 MED ORDER — REGADENOSON 0.4 MG/5ML IV SOLN
INTRAVENOUS | Status: AC
  Administered 2022-11-28: 0.4 mg via INTRAVENOUS
  Filled 2022-11-28: qty 5

## 2022-11-28 MED ORDER — TECHNETIUM TC 99M TETROFOSMIN IV KIT
31.3000 | PACK | Freq: Once | INTRAVENOUS | Status: AC | PRN
  Administered 2022-11-28: 31.3 via INTRAVENOUS

## 2022-11-29 DIAGNOSIS — Z8551 Personal history of malignant neoplasm of bladder: Secondary | ICD-10-CM | POA: Diagnosis not present

## 2022-12-04 ENCOUNTER — Ambulatory Visit: Payer: Medicare Other | Attending: Nurse Practitioner

## 2022-12-04 ENCOUNTER — Telehealth: Payer: Self-pay | Admitting: Nurse Practitioner

## 2022-12-04 DIAGNOSIS — R0609 Other forms of dyspnea: Secondary | ICD-10-CM | POA: Insufficient documentation

## 2022-12-04 LAB — ECHOCARDIOGRAM COMPLETE
AR max vel: 3.33 cm2
AV Peak grad: 3.8 mmHg
Ao pk vel: 0.97 m/s
Calc EF: 64.5 %
MV M vel: 4.99 m/s
MV Peak grad: 99.4 mmHg
S' Lateral: 2.8 cm
Single Plane A2C EF: 68.6 %
Single Plane A4C EF: 62.3 %

## 2022-12-04 NOTE — Telephone Encounter (Signed)
Kenneth [name] would like someone to call and explain the results of his recent stress test.

## 2022-12-04 NOTE — Telephone Encounter (Signed)
Called and discussed nuclear stress test results and echocardiogram results with patient.  Advised patient to increase Lasix to 80 mg daily for the next 5 days and then return to 40 mg daily thereafter d/t findings of RV overload on Echocardiogram.  He verbalized understanding.  I suggested to repeat cardiac monitor, however patient declines at this time.   If symptoms are not improved by next follow-up visit, have low threshold to consider right and left heart cath based on patient's symptoms, and I will discuss this with Dr. Carlyle Dolly, patient's primary cardiologist.  Patient verbalized understanding of our phone call conversation and was appreciative of my call.  Finis Bud, NP

## 2022-12-04 NOTE — Telephone Encounter (Signed)
Patient made aware of results of stress test results. States that he would like to speak with Benjamine Mola to further discuss before appointment.

## 2022-12-21 ENCOUNTER — Ambulatory Visit: Payer: Medicare Other | Attending: Nurse Practitioner | Admitting: Nurse Practitioner

## 2022-12-21 ENCOUNTER — Encounter: Payer: Self-pay | Admitting: Nurse Practitioner

## 2022-12-21 VITALS — BP 122/64 | HR 73 | Ht 72.0 in | Wt 200.8 lb

## 2022-12-21 DIAGNOSIS — I5022 Chronic systolic (congestive) heart failure: Secondary | ICD-10-CM | POA: Diagnosis not present

## 2022-12-21 DIAGNOSIS — R0609 Other forms of dyspnea: Secondary | ICD-10-CM | POA: Insufficient documentation

## 2022-12-21 DIAGNOSIS — R5383 Other fatigue: Secondary | ICD-10-CM

## 2022-12-21 DIAGNOSIS — R0989 Other specified symptoms and signs involving the circulatory and respiratory systems: Secondary | ICD-10-CM | POA: Insufficient documentation

## 2022-12-21 DIAGNOSIS — R55 Syncope and collapse: Secondary | ICD-10-CM | POA: Diagnosis not present

## 2022-12-21 DIAGNOSIS — I4819 Other persistent atrial fibrillation: Secondary | ICD-10-CM | POA: Diagnosis not present

## 2022-12-21 DIAGNOSIS — R531 Weakness: Secondary | ICD-10-CM | POA: Insufficient documentation

## 2022-12-21 MED ORDER — METOPROLOL SUCCINATE ER 50 MG PO TB24: 50.0000 mg | ORAL_TABLET | Freq: Every day | ORAL | 3 refills | Status: DC

## 2022-12-21 NOTE — Patient Instructions (Addendum)
Medication Instructions:  Your physician has recommended you make the following change in your medication:  - Decrease Metoprolol to 50 mg once daily.   *If you need a refill on your cardiac medications before your next appointment, please call your pharmacy*   Lab Work: Pro BNP BMP If you have labs (blood work) drawn today and your tests are completely normal, you will receive your results only by: [place] (if you have MyChart) OR A paper copy in the mail If you have any lab test that is abnormal or we need to change your treatment, we will call you to review the results.   Testing/Procedures: None   Follow-Up: At [facility], you and your health needs are our priority.  As part of our continuing mission to provide you with exceptional heart care, we have created designated Provider Care Teams.  These Care Teams include your primary Cardiologist (physician) and Advanced Practice Providers (APPs -  Physician Assistants and Nurse Practitioners) who all work together to provide you with the care you need, when you need it.  We recommend signing up for the patient portal called "MyChart".  Sign up information is provided on this After Visit Summary.  MyChart is used to connect with patients for Virtual Visits (Telemedicine).  Patients are able to view lab/test results, encounter notes, upcoming appointments, etc.  Non-urgent messages can be sent to your provider as well.   To learn more about what you can do with MyChart, go to [email or web address].    Your next appointment:   1 month(s)  Provider:   Finis Bud, NP    Other Instructions
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
## 2023-01-11 ENCOUNTER — Ambulatory Visit: Payer: Medicare Other | Admitting: Cardiology

## 2023-01-14 ENCOUNTER — Other Ambulatory Visit: Payer: Self-pay | Admitting: Cardiology

## 2023-01-15 ENCOUNTER — Other Ambulatory Visit: Payer: Self-pay

## 2023-01-15 ENCOUNTER — Other Ambulatory Visit: Payer: Self-pay | Admitting: Cardiology

## 2023-01-15 MED ORDER — FUROSEMIDE 40 MG PO TABS: ORAL_TABLET | ORAL | 3 refills | Status: DC

## 2023-01-23 ENCOUNTER — Ambulatory Visit: Payer: Medicare Other | Attending: Nurse Practitioner | Admitting: Nurse Practitioner

## 2023-01-23 ENCOUNTER — Telehealth: Payer: Self-pay | Admitting: Nurse Practitioner

## 2023-01-23 ENCOUNTER — Encounter: Payer: Self-pay | Admitting: Nurse Practitioner

## 2023-01-23 VITALS — BP 110/70 | HR 78 | Ht 72.0 in | Wt 200.2 lb

## 2023-01-23 DIAGNOSIS — R531 Weakness: Secondary | ICD-10-CM | POA: Diagnosis not present

## 2023-01-23 DIAGNOSIS — R5383 Other fatigue: Secondary | ICD-10-CM | POA: Diagnosis not present

## 2023-01-23 DIAGNOSIS — Z01812 Encounter for preprocedural laboratory examination: Secondary | ICD-10-CM

## 2023-01-23 DIAGNOSIS — I5032 Chronic diastolic (congestive) heart failure: Secondary | ICD-10-CM

## 2023-01-23 DIAGNOSIS — R0609 Other forms of dyspnea: Secondary | ICD-10-CM | POA: Diagnosis not present

## 2023-01-23 DIAGNOSIS — R55 Syncope and collapse: Secondary | ICD-10-CM | POA: Insufficient documentation

## 2023-01-23 DIAGNOSIS — I4819 Other persistent atrial fibrillation: Secondary | ICD-10-CM | POA: Insufficient documentation

## 2023-01-23 MED ORDER — SODIUM CHLORIDE 0.9% FLUSH: 3.0000 mL | Freq: Two times a day (BID) | INTRAVENOUS | Status: AC

## 2023-01-23 MED ORDER — METOPROLOL SUCCINATE ER 50 MG PO TB24: 25.0000 mg | ORAL_TABLET | Freq: Every day | ORAL | 1 refills | Status: DC
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
## 2023-01-23 NOTE — Telephone Encounter (Signed)
    Rt & Lt Heart Cath 01/30/23 @ 8:30 am w/Harding

## 2023-01-23 NOTE — Patient Instructions (Addendum)
Medication Instructions:  Your physician has recommended you make the following change in your medication:  DECREASE metoprolol to 25 mg once a day Continue all other medications as directed  Labwork: CBC, BMET  Testing/Procedures:  [facility] A DEPT OF [facility] [place] [address] [place] [id number] [facility] [place] [address] Dept: [phone] Loc: [phone]  OHM DENTLER  01/23/2023  You are scheduled for a Cardiac Catheterization on Tuesday, April 23 with Dr. Bryan Lemma.  1. Please arrive at the [facility] (Main Entrance A) at [facility]: [address] [place], [place] [address] at 5:30 AM (This time is two hours before your procedure to ensure your preparation). Free valet parking service is available. You will check in at ADMITTING. The support person will be asked to wait in the waiting room.  It is OK to have someone drop you off and come back when you are ready to be discharged.    Special note: Every effort is made to have your procedure done on time. Please understand that emergencies sometimes delay scheduled procedures.  2. Diet: Do not eat solid foods after midnight.  The patient may have clear liquids until 5am upon the day of the procedure.  3. Labs: You will need to have blood drawn on Friday, April 19 at [facility]. You do not need to be fasting.  4. Medication instructions in preparation for your procedure:   Contrast Allergy: No  Stop taking Eliquis (Apixiban) on Sunday, April 21.  Stop taking, Lasix (Furosemide)  Tuesday, April 23,  On the morning of your procedure take any morning medicines NOT listed above.  You may use sips of water.  5. Plan to go home the same day, you will only stay overnight if medically necessary. 6. Bring a current list of your medications and current insurance cards. 7. You MUST have a responsible person to drive you home. 8. Someone MUST be with you  the first 24 hours after you arrive home or your discharge will be delayed. 9. Please wear clothes that are easy to get on and off and wear slip-on shoes.  Thank you for allowing [place] to care for you!   --  Invasive Cardiovascular services   Follow-Up:  Your physician recommends that you schedule a follow-up appointment in: 4 weeks  Any Other Special Instructions Will Be Listed Below (If Applicable).  If you need a refill on your cardiac medications before your next appointment, please call your pharmacy.

## 2023-01-26 ENCOUNTER — Other Ambulatory Visit (HOSPITAL_COMMUNITY)
Admission: RE | Admit: 2023-01-26 | Discharge: 2023-01-26 | Disposition: A | Discharge status: Home / Self Care | Payer: Medicare Other | Source: Ambulatory Visit | Attending: Nurse Practitioner | Admitting: Nurse Practitioner

## 2023-01-26 DIAGNOSIS — Z01812 Encounter for preprocedural laboratory examination: Secondary | ICD-10-CM | POA: Insufficient documentation

## 2023-01-26 LAB — CBC
HCT: 43.6 % (ref 39.0–52.0)
Hemoglobin: 14.6 g/dL (ref 13.0–17.0)
MCH: 31.1 pg (ref 26.0–34.0)
MCHC: 33.5 g/dL (ref 30.0–36.0)
MCV: 92.8 fL (ref 80.0–100.0)
Platelets: 137 10*3/uL — ABNORMAL LOW (ref 150–400)
RBC: 4.7 MIL/uL (ref 4.22–5.81)
RDW: 13.6 % (ref 11.5–15.5)
WBC: 4.3 10*3/uL (ref 4.0–10.5)
nRBC: 0 % (ref 0.0–0.2)

## 2023-01-26 LAB — BASIC METABOLIC PANEL
Anion gap: 10 (ref 5–15)
BUN: 20 mg/dL (ref 8–23)
CO2: 26 mmol/L (ref 22–32)
Calcium: 9.3 mg/dL (ref 8.9–10.3)
Chloride: 102 mmol/L (ref 98–111)
Creatinine, Ser: 1.14 mg/dL (ref 0.61–1.24)
GFR, Estimated: >60 mL/min (ref >60)
Glucose, Bld: 102 mg/dL — ABNORMAL HIGH (ref 70–99)
Potassium: 3.7 mmol/L (ref 3.5–5.1)
Sodium: 138 mmol/L (ref 135–145)

## 2023-01-29 ENCOUNTER — Telehealth: Payer: Self-pay | Admitting: *Deleted

## 2023-01-29 NOTE — Telephone Encounter (Signed)
Cardiac Catheterization scheduled at [facility] for: Tuesday January 30, 2023 7:30 AM Arrival time [facility] Main Entrance A at: 5:30 AM  Nothing to eat after midnight prior to procedure, clear liquids until 5 AM day of procedure.  Medication instructions: -Hold:  Eliquis-none 01/28/23 until post procedure   Lasix/KCl-AM of procedure  -Other usual morning medications can be taken with sips of water including aspirin 81 mg.  Confirmed patient has responsible adult to drive home post procedure and be with patient first 24 hours after arriving home.  Plan to go home the same day, you will only stay overnight if medically necessary.  Reviewed procedure instructions with patient.

## 2023-01-30 ENCOUNTER — Encounter (HOSPITAL_COMMUNITY)
Admission: RE | Disposition: A | Discharge status: Home / Self Care | Payer: Self-pay | Source: Home / Self Care | Attending: Cardiology

## 2023-01-30 ENCOUNTER — Ambulatory Visit (HOSPITAL_COMMUNITY)
Admission: RE | Admit: 2023-01-30 | Discharge: 2023-01-30 | Disposition: A | Discharge status: Home / Self Care | Payer: Medicare Other | Attending: Cardiology | Admitting: Cardiology

## 2023-01-30 ENCOUNTER — Encounter (HOSPITAL_COMMUNITY): Payer: Self-pay | Admitting: Cardiology

## 2023-01-30 DIAGNOSIS — Z7901 Long term (current) use of anticoagulants: Secondary | ICD-10-CM | POA: Diagnosis not present

## 2023-01-30 DIAGNOSIS — I4819 Other persistent atrial fibrillation: Secondary | ICD-10-CM | POA: Diagnosis not present

## 2023-01-30 DIAGNOSIS — R5383 Other fatigue: Secondary | ICD-10-CM

## 2023-01-30 DIAGNOSIS — I5042 Chronic combined systolic (congestive) and diastolic (congestive) heart failure: Secondary | ICD-10-CM | POA: Insufficient documentation

## 2023-01-30 DIAGNOSIS — Z8551 Personal history of malignant neoplasm of bladder: Secondary | ICD-10-CM | POA: Insufficient documentation

## 2023-01-30 DIAGNOSIS — Z79899 Other long term (current) drug therapy: Secondary | ICD-10-CM | POA: Diagnosis not present

## 2023-01-30 DIAGNOSIS — R0600 Dyspnea, unspecified: Secondary | ICD-10-CM | POA: Diagnosis not present

## 2023-01-30 DIAGNOSIS — R0609 Other forms of dyspnea: Secondary | ICD-10-CM

## 2023-01-30 DIAGNOSIS — Z87891 Personal history of nicotine dependence: Secondary | ICD-10-CM | POA: Insufficient documentation

## 2023-01-30 DIAGNOSIS — I2510 Atherosclerotic heart disease of native coronary artery without angina pectoris: Secondary | ICD-10-CM | POA: Insufficient documentation

## 2023-01-30 DIAGNOSIS — I50810 Right heart failure, unspecified: Secondary | ICD-10-CM

## 2023-01-30 DIAGNOSIS — R55 Syncope and collapse: Secondary | ICD-10-CM

## 2023-01-30 DIAGNOSIS — R531 Weakness: Secondary | ICD-10-CM

## 2023-01-30 DIAGNOSIS — Z8249 Family history of ischemic heart disease and other diseases of the circulatory system: Secondary | ICD-10-CM | POA: Diagnosis not present

## 2023-01-30 HISTORY — PX: RIGHT/LEFT HEART CATH AND CORONARY ANGIOGRAPHY: CATH118266

## 2023-01-30 LAB — POCT I-STAT EG7
Acid-Base Excess: 0 mmol/L (ref 0.0–2.0)
Acid-Base Excess: 1 mmol/L (ref 0.0–2.0)
Acid-Base Excess: 1 mmol/L (ref 0.0–2.0)
Bicarbonate: 26.1 mmol/L (ref 20.0–28.0)
Bicarbonate: 27.1 mmol/L (ref 20.0–28.0)
Bicarbonate: 27.2 mmol/L (ref 20.0–28.0)
Calcium, Ion: 1.16 mmol/L (ref 1.15–1.40)
Calcium, Ion: 1.21 mmol/L (ref 1.15–1.40)
Calcium, Ion: 1.24 mmol/L (ref 1.15–1.40)
HCT: 38 % — ABNORMAL LOW (ref 39.0–52.0)
HCT: 39 % (ref 39.0–52.0)
HCT: 39 % (ref 39.0–52.0)
Hemoglobin: 12.9 g/dL — ABNORMAL LOW (ref 13.0–17.0)
Hemoglobin: 13.3 g/dL (ref 13.0–17.0)
Hemoglobin: 13.3 g/dL (ref 13.0–17.0)
O2 Saturation: 69 %
O2 Saturation: 69 %
O2 Saturation: 96 %
Potassium: 4.1 mmol/L (ref 3.5–5.1)
Potassium: 4.2 mmol/L (ref 3.5–5.1)
Potassium: 4.3 mmol/L (ref 3.5–5.1)
Sodium: 141 mmol/L (ref 135–145)
Sodium: 142 mmol/L (ref 135–145)
Sodium: 143 mmol/L (ref 135–145)
TCO2: 27 mmol/L (ref 22–32)
TCO2: 29 mmol/L (ref 22–32)
TCO2: 29 mmol/L (ref 22–32)
pCO2, Ven: 47.5 mmHg (ref 44–60)
pCO2, Ven: 50.2 mmHg (ref 44–60)
pCO2, Ven: 51.1 mmHg (ref 44–60)
pH, Ven: 7.334 (ref 7.25–7.43)
pH, Ven: 7.339 (ref 7.25–7.43)
pH, Ven: 7.347 (ref 7.25–7.43)
pO2, Ven: 39 mmHg (ref 32–45)
pO2, Ven: 39 mmHg (ref 32–45)
pO2, Ven: 86 mmHg — ABNORMAL HIGH (ref 32–45)

## 2023-01-30 SURGERY — RIGHT/LEFT HEART CATH AND CORONARY ANGIOGRAPHY
Anesthesia: LOCAL

## 2023-01-30 MED ORDER — SODIUM CHLORIDE 0.9 % IV SOLN: INTRAVENOUS | Status: DC

## 2023-01-30 MED ORDER — HEPARIN SODIUM (PORCINE) 1000 UNIT/ML IJ SOLN
INTRAMUSCULAR | Status: AC
  Filled 2023-01-30: qty 10

## 2023-01-30 MED ORDER — ASPIRIN 81 MG PO CHEW: 81.0000 mg | CHEWABLE_TABLET | Freq: Once | ORAL | Status: DC

## 2023-01-30 MED ORDER — LABETALOL HCL 5 MG/ML IV SOLN: 10.0000 mg | INTRAVENOUS | Status: DC | PRN

## 2023-01-30 MED ORDER — MIDAZOLAM HCL 2 MG/2ML IJ SOLN
INTRAMUSCULAR | Status: AC
  Filled 2023-01-30: qty 2

## 2023-01-30 MED ORDER — FENTANYL CITRATE (PF) 100 MCG/2ML IJ SOLN
INTRAMUSCULAR | Status: AC
  Filled 2023-01-30: qty 2

## 2023-01-30 MED ORDER — LIDOCAINE HCL (PF) 1 % IJ SOLN
INTRAMUSCULAR | Status: DC | PRN
  Administered 2023-01-30: 2 mL
  Administered 2023-01-30: 5 mL
  Administered 2023-01-30: 2 mL

## 2023-01-30 MED ORDER — ACETAMINOPHEN 325 MG PO TABS: 650.0000 mg | ORAL_TABLET | ORAL | Status: DC | PRN

## 2023-01-30 MED ORDER — LIDOCAINE HCL (PF) 1 % IJ SOLN
INTRAMUSCULAR | Status: AC
  Filled 2023-01-30: qty 30

## 2023-01-30 MED ORDER — HEPARIN (PORCINE) IN NACL 1000-0.9 UT/500ML-% IV SOLN
INTRAVENOUS | Status: DC | PRN
  Administered 2023-01-30 (×2): 500 mL

## 2023-01-30 MED ORDER — IOHEXOL 350 MG/ML SOLN
INTRAVENOUS | Status: DC | PRN
  Administered 2023-01-30: 45 mL via INTRA_ARTERIAL

## 2023-01-30 MED ORDER — FENTANYL CITRATE (PF) 100 MCG/2ML IJ SOLN
INTRAMUSCULAR | Status: DC | PRN
  Administered 2023-01-30: 25 ug via INTRAVENOUS

## 2023-01-30 MED ORDER — HEPARIN SODIUM (PORCINE) 1000 UNIT/ML IJ SOLN
INTRAMUSCULAR | Status: DC | PRN
  Administered 2023-01-30: 4500 [IU] via INTRAVENOUS

## 2023-01-30 MED ORDER — ONDANSETRON HCL 4 MG/2ML IJ SOLN: 4.0000 mg | Freq: Four times a day (QID) | INTRAMUSCULAR | Status: DC | PRN

## 2023-01-30 MED ORDER — MIDAZOLAM HCL 2 MG/2ML IJ SOLN
INTRAMUSCULAR | Status: DC | PRN
  Administered 2023-01-30: 1 mg via INTRAVENOUS

## 2023-01-30 MED ORDER — SODIUM CHLORIDE 0.9 % IV SOLN: 250.0000 mL | INTRAVENOUS | Status: DC | PRN

## 2023-01-30 MED ORDER — HYDRALAZINE HCL 20 MG/ML IJ SOLN: 10.0000 mg | INTRAMUSCULAR | Status: DC | PRN

## 2023-01-30 MED ORDER — SODIUM CHLORIDE 0.9% FLUSH: 3.0000 mL | INTRAVENOUS | Status: DC | PRN

## 2023-01-30 SURGICAL SUPPLY — 12 items
CATH BALLN WEDGE 5F 110CM (CATHETERS) IMPLANT
CATH INFINITI 5FR ANG PIGTAIL (CATHETERS) IMPLANT
CATH OPTITORQUE TIG 4.0 5F (CATHETERS) IMPLANT
DEVICE RAD COMP TR BAND LRG (VASCULAR PRODUCTS) IMPLANT
GLIDESHEATH SLEND SS 6F .021 (SHEATH) IMPLANT
GUIDEWIRE INQWIRE 1.5J.035X260 (WIRE) IMPLANT
INQWIRE 1.5J .035X260CM (WIRE) ×1
KIT HEART LEFT (KITS) ×1 IMPLANT
PACK CARDIAC CATHETERIZATION (CUSTOM PROCEDURE TRAY) ×1 IMPLANT
SHEATH GLIDE SLENDER 4/5FR (SHEATH) IMPLANT
TRANSDUCER W/STOPCOCK (MISCELLANEOUS) ×1 IMPLANT
TUBING CIL FLEX 10 FLL-RA (TUBING) ×1 IMPLANT

## 2023-01-30 NOTE — Progress Notes (Signed)
TR BAND REMOVAL  LOCATION:    right radial  DEFLATED PER PROTOCOL:    Yes.    TIME BAND OFF / DRESSING APPLIED:    1045 gauze dressing applied    SITE UPON ARRIVAL:    Level 0  SITE AFTER BAND REMOVAL:    Level 0  CIRCULATION SENSATION AND MOVEMENT:    Within Normal Limits   Yes.    COMMENTS:   no new issues noted

## 2023-01-30 NOTE — Interval H&P Note (Signed)
History and Physical Interval Note:  01/30/2023 7:25 AM  Kenneth [name]  has presented today for surgery, with the diagnosis of dyspnea,  RV failure, near syncope.  The various methods of treatment have been discussed with the patient and family. After consideration of risks, benefits and other options for treatment, the patient has consented to  Procedure(s): RIGHT/LEFT HEART CATH AND CORONARY ANGIOGRAPHY (N/A)  PERCUTANEOUS CORONARY INTERVENTION  as a surgical intervention.  The patient's history has been reviewed, patient examined, no change in status, stable for surgery.  I have reviewed the patient's chart and labs.  Questions were answered to the patient's satisfaction.   Cath Lab Visit (complete for each Cath Lab visit)  Clinical Evaluation Leading to the Procedure:   ACS: No.  Non-ACS:    Anginal Classification: CCS III - AS DYSPNEA ON EXERTION  Anti-ischemic medical therapy: Minimal Therapy (1 class of medications)  Non-Invasive Test Results: Low-risk stress test findings: cardiac mortality <1%/year  Prior CABG: No previous CABG    Bryan Lemma

## 2023-01-30 NOTE — Discharge Instructions (Signed)

## 2023-02-21 ENCOUNTER — Telehealth: Payer: Self-pay | Admitting: Nurse Practitioner

## 2023-02-21 DIAGNOSIS — Z1339 Encounter for screening examination for other mental health and behavioral disorders: Secondary | ICD-10-CM | POA: Diagnosis not present

## 2023-02-21 DIAGNOSIS — Z299 Encounter for prophylactic measures, unspecified: Secondary | ICD-10-CM | POA: Diagnosis not present

## 2023-02-21 DIAGNOSIS — Z87891 Personal history of nicotine dependence: Secondary | ICD-10-CM | POA: Diagnosis not present

## 2023-02-21 DIAGNOSIS — Z1331 Encounter for screening for depression: Secondary | ICD-10-CM | POA: Diagnosis not present

## 2023-02-21 DIAGNOSIS — I5032 Chronic diastolic (congestive) heart failure: Secondary | ICD-10-CM | POA: Diagnosis not present

## 2023-02-21 DIAGNOSIS — I10 Essential (primary) hypertension: Secondary | ICD-10-CM | POA: Diagnosis not present

## 2023-02-21 DIAGNOSIS — J449 Chronic obstructive pulmonary disease, unspecified: Secondary | ICD-10-CM | POA: Diagnosis not present

## 2023-02-21 DIAGNOSIS — R5383 Other fatigue: Secondary | ICD-10-CM | POA: Diagnosis not present

## 2023-02-21 DIAGNOSIS — Z7189 Other specified counseling: Secondary | ICD-10-CM | POA: Diagnosis not present

## 2023-02-21 DIAGNOSIS — Z0000 Encounter for general adult medical examination without abnormal findings: Secondary | ICD-10-CM | POA: Diagnosis not present

## 2023-02-21 MED ORDER — METOPROLOL SUCCINATE ER 25 MG PO TB24: 25.0000 mg | ORAL_TABLET | Freq: Every day | ORAL | 1 refills | Status: DC

## 2023-02-21 NOTE — Telephone Encounter (Signed)
Patient walked into the office regarding medication  metoprolol succinate metoprolol succinate (TOPROL-XL) 50 MG 24 hr tablet  States that last visit with Lanora Manis she told him to start taking 25 mg. He went to [facility], [place] and was told that they had refilled his medication with the 100mg . Patient would like for [place] to notify [facility] of the correct dosage and have them to mark off of his chart the mgs that he doesn't need to be taking anymore.

## 2023-02-21 NOTE — Telephone Encounter (Signed)
Message left on voicemail that toprol xl 25 mg daily rx refill sent to [facility].

## 2023-02-23 DIAGNOSIS — R5383 Other fatigue: Secondary | ICD-10-CM | POA: Diagnosis not present

## 2023-02-23 DIAGNOSIS — E7800 Pure hypercholesterolemia, unspecified: Secondary | ICD-10-CM | POA: Diagnosis not present

## 2023-02-23 DIAGNOSIS — Z79899 Other long term (current) drug therapy: Secondary | ICD-10-CM | POA: Diagnosis not present

## 2023-02-23 DIAGNOSIS — Z125 Encounter for screening for malignant neoplasm of prostate: Secondary | ICD-10-CM | POA: Diagnosis not present

## 2023-02-26 ENCOUNTER — Other Ambulatory Visit: Payer: Self-pay | Admitting: Internal Medicine

## 2023-03-19 DIAGNOSIS — J449 Chronic obstructive pulmonary disease, unspecified: Secondary | ICD-10-CM | POA: Diagnosis not present

## 2023-03-19 DIAGNOSIS — Z299 Encounter for prophylactic measures, unspecified: Secondary | ICD-10-CM | POA: Diagnosis not present

## 2023-03-19 DIAGNOSIS — J069 Acute upper respiratory infection, unspecified: Secondary | ICD-10-CM | POA: Diagnosis not present

## 2023-03-19 DIAGNOSIS — I4891 Unspecified atrial fibrillation: Secondary | ICD-10-CM | POA: Diagnosis not present

## 2023-03-19 DIAGNOSIS — R0981 Nasal congestion: Secondary | ICD-10-CM | POA: Diagnosis not present

## 2023-03-21 DIAGNOSIS — H7011 Chronic mastoiditis, right ear: Secondary | ICD-10-CM | POA: Diagnosis not present

## 2023-04-06 DIAGNOSIS — M47812 Spondylosis without myelopathy or radiculopathy, cervical region: Secondary | ICD-10-CM | POA: Diagnosis not present

## 2023-04-06 DIAGNOSIS — M5030 Other cervical disc degeneration, unspecified cervical region: Secondary | ICD-10-CM | POA: Diagnosis not present

## 2023-04-06 DIAGNOSIS — M542 Cervicalgia: Secondary | ICD-10-CM | POA: Diagnosis not present

## 2023-04-06 DIAGNOSIS — I4891 Unspecified atrial fibrillation: Secondary | ICD-10-CM | POA: Diagnosis not present

## 2023-04-16 DIAGNOSIS — I10 Essential (primary) hypertension: Secondary | ICD-10-CM | POA: Diagnosis not present

## 2023-04-16 DIAGNOSIS — M25511 Pain in right shoulder: Secondary | ICD-10-CM | POA: Diagnosis not present

## 2023-04-16 DIAGNOSIS — Z299 Encounter for prophylactic measures, unspecified: Secondary | ICD-10-CM | POA: Diagnosis not present

## 2023-04-24 ENCOUNTER — Ambulatory Visit: Payer: Medicare Other | Attending: Internal Medicine | Admitting: Internal Medicine

## 2023-04-24 VITALS — BP 110/80 | HR 86 | Ht 72.0 in | Wt 199.2 lb

## 2023-04-24 DIAGNOSIS — I483 Typical atrial flutter: Secondary | ICD-10-CM | POA: Diagnosis not present

## 2023-04-24 NOTE — Patient Instructions (Signed)

## 2023-04-24 NOTE — Progress Notes (Signed)
HPI [name] returns today for ongoing evaluation of atrial fib. He is a pleasant 72 yo man with longstanding atrial fib, and recent development of a tachy induced CM. His atrial fib dates back at least 5 years. His rates have increased and a heart monitor obtained in December demonstated an ave HR of 145. He has not had syncope. He has been treated with chemo and XRT for bladder CA. He has been treated under my direction with a combination of a beta blocker and digoxin. He feels much better and his HR appears to be well controlled. His dyspnea is much better. No syncope. No Known Allergies   Current Outpatient Medications  Medication Sig Dispense Refill   acetaminophen (TYLENOL) 500 MG tablet Take 500-1,000 mg by mouth every 6 (six) hours as needed for moderate pain.     alendronate (FOSAMAX) 70 MG tablet Take 70 mg by mouth once a week. Takes on Thursday's ;  Take with a full glass of water on an empty stomach.     apixaban (ELIQUIS) 5 MG TABS tablet Take 1 tablet (5 mg total) by mouth 2 (two) times daily. 60 tablet 0   Ascorbic Acid (VITAMIN C) 1000 MG tablet Take 1,000 mg by mouth 2 (two) times daily.     atorvastatin (LIPITOR) 40 MG tablet Take 1 tablet (40 mg total) by mouth daily. 90 tablet 3   calcium-vitamin D (OSCAL WITH D) 500-200 MG-UNIT tablet Take 1 tablet by mouth 2 (two) times daily.     CIPROFLOXACIN-DEXAMETHASONE OT Place 3 drops in ear(s) daily as needed (ear infection).     digoxin (LANOXIN) 0.125 MG tablet Take 1 tablet (125 mcg total) by mouth daily. Please keep scheduled appointment for future refills. Thank you. 90 tablet 0   furosemide (LASIX) 40 MG tablet Take 40 mg daily.May take an additional dose for led swelling or weight gain 90 tablet 3   loperamide (IMODIUM A-D) 2 MG tablet Take 2 mg by mouth 4 (four) times daily as needed for diarrhea or loose stools.     Magnesium 250 MG TABS Take 250 mg by mouth daily.     metoprolol succinate (TOPROL XL) 25 MG 24 hr  tablet Take 1 tablet (25 mg total) by mouth daily. 90 tablet 1   Multiple Vitamins-Minerals (MULTIVITAMIN PO) Take 1 tablet by mouth daily.     omeprazole (PRILOSEC) 20 MG capsule Take 20 mg by mouth daily.     Polyethyl Glycol-Propyl Glycol (SYSTANE OP) Place 1 drop into both eyes daily as needed (dry eyes).     potassium chloride SA (KLOR-CON M) 20 MEQ tablet Take 1 tablet by mouth once daily 90 tablet 1   pseudoephedrine (SUDAFED) 30 MG tablet Take 30 mg by mouth daily as needed for congestion.     sildenafil (REVATIO) 20 MG tablet Take 20-60 mg by mouth daily as needed (erectile dysfunction).     tiZANidine (ZANAFLEX) 4 MG capsule Take 4 mg by mouth 2 (two) times daily as needed for muscle spasms.     Current Facility-Administered Medications  Medication Dose Route Frequency Provider Last Rate Last Admin   sodium chloride flush (NS) 0.9 % injection 3 mL  3 mL Intravenous Q12H Sharlene Dory, NP         Past Medical History:  Diagnosis Date   Anticoagulant long-term use    eliquis--- managed by cardiology   Arthritis    osteoporosis   Atrial fibrillation, unspecified type (HCC) 2018  cardiologist--- dr j. branch   Bladder neoplasm    Bradycardia    COPD (chronic obstructive pulmonary disease) (HCC)    Gallbladder polyp    followed by surgeon, dr r. cathy (lov note in epic 06-27-2021 stated surgical management after pt has completed bladder cancer treatment   GERD (gastroesophageal reflux disease)    Heart murmur    History of drug abuse in remission (HCC)    per pt in remission since 02-26-1999   History of hepatitis C 12/2020   followed by dr d. Marguerita Merles (aph/ [facility] and hepatology);  dx 03/ 2022, liver bx 01-12-2021 g2hepatitis, f2;  completed 12 wks treatment 04-14-2021 w/ epclusa, hcv undetectable 04-21-2021   Mitral valve regurgitation    followed by dr branch---  last echo in epic 02-10-2021  moderate MR without stenosis   Osteoporosis    Wears dentures     full upper and lower partial   Wears hearing aid in left ear     ROS:   All systems reviewed and negative except as noted in the HPI.   Past Surgical History:  Procedure Laterality Date   APPENDECTOMY     age 3   BROW LIFT AND BLEPHAROPLASTY Bilateral    APPROX. 2017   CATARACT EXTRACTION W/ INTRAOCULAR LENS IMPLANT Bilateral 2018   HEMORROIDECTOMY     2010 @[facility]  and 2018 @ [facility]   HYDROCELE EXCISION Right 08/13/2017   Procedure: RIGHT HYDROCELECTOMY;  Surgeon: Malen Gauze, MD;  Location: [facility];  Service: Urology;  Laterality: Right;   HYDROCELE EXCISION Right 11/12/2017   Procedure: RIGHT HYDROCELECTOMY;  Surgeon: Malen Gauze, MD;  Location: [facility];  Service: Urology;  Laterality: Right;   INGUINAL HERNIA REPAIR Bilateral    APPROX.   2010;   WITH UMBILICAL HERNIA AND EPIGRASTIC HERNIA REPAIRS   INGUINAL HERNIA REPAIR  09/11/2011   Procedure: HERNIA REPAIR INGUINAL ADULT;  Surgeon: Marlane Hatcher;  Location: [facility];  Service: General;  Laterality: Right;  Recurrent Right Inguinal Hernia Repair   MANDIBLE RECONSTRUCTION     x2  last one 1980;   from [facility]  ( has retained hardware)   MIDDLE EAR SURGERY Right 02/19/2015   by Dr Suszanne Conners ;  tympanomastoidectomy   RIGHT/LEFT HEART CATH AND CORONARY ANGIOGRAPHY N/A 01/30/2023   Procedure: RIGHT/LEFT HEART CATH AND CORONARY ANGIOGRAPHY;  Surgeon: Marykay Lex, MD;  Location: [facility];  Service: Cardiovascular;  Laterality: N/A;   ROTATOR CUFF REPAIR Left 03/31/2016   TRANSURETHRAL RESECTION OF BLADDER TUMOR N/A 06/17/2021   Procedure: TRANSURETHRAL RESECTION OF BLADDER TUMOR (TURBT) WITH POST OPERATIVE INSTILLATION OF GEMCITABINE;  Surgeon: Jerilee Field, MD;  Location: [facility];  Service: Urology;  Laterality: N/A;   TRANSURETHRAL RESECTION OF BLADDER TUMOR N/A 08/16/2021   Procedure: TRANSURETHRAL RESECTION OF BLADDER TUMOR (TURBT);  Surgeon: Jerilee Field, MD;  Location:  [facility];  Service: Urology;  Laterality: N/A;   TYMPANOPLASTY Right 05/02/2016   Procedure: TYMPANOPLASTY;  Surgeon: Newman Pies, MD;  Location: [facility];  Service: ENT;  Laterality: Right;   UMBILICAL HERNIA REPAIR  2017     Family History  Problem Relation Age of Onset   Aortic aneurysm Other        Deceased   Cancer Other        Deceased   Cardiomyopathy Other        Alive   Anesthesia problems Neg Hx  Hypotension Neg Hx    Malignant hyperthermia Neg Hx    Pseudochol deficiency Neg Hx      Social History   Socioeconomic History   Marital status: Divorced    Spouse name: Not on file   Number of children: Not on file   Years of education: Not on file   Highest education level: Not on file  Occupational History   Not on file  Tobacco Use   Smoking status: Former    Current packs/day: 0.00    Average packs/day: 1 pack/day for 21.0 years (21.0 ttl pk-yrs)    Types: Cigarettes    Start date: 09/07/1986    Quit date: 09/08/2007    Years since quitting: 15.6    Passive exposure: Never   Smokeless tobacco: Former    Quit date: 2008  Vaping Use   Vaping status: Never Used  Substance and Sexual Activity   Alcohol use: Not Currently    Comment: quit 02-26-1999   Drug use: Not Currently    Comment: per pt quit 02-26-1999 in remission (-cocaine,Crack,heroin)   Sexual activity: Yes    Birth control/protection: None  Other Topics Concern   Not on file  Social History Narrative   Not on file   Social Determinants of Health   Financial Resource Strain: Not on file  Food Insecurity: Not on file  Transportation Needs: No Transportation Needs (06/05/2022)   Received [facility], [facility]   PRAPARE - Transportation    Lack of Transportation (Medical): No    Lack of Transportation (Non-Medical): No  Physical Activity: Not on file  Stress: Not on file  Social Connections: Not on file  Intimate Partner Violence: Not on  file     BP 110/80 (BP Location: Left Arm, Patient Position: Sitting, Cuff Size: Normal)   Pulse 86   Ht 6' (1.829 m)   Wt 199 lb 3.2 oz (90.4 kg)   SpO2 94%   BMI 27.02 kg/m   Physical Exam:  Well appearing NAD HEENT: Unremarkable Neck:  No JVD, no thyromegally Lymphatics:  No adenopathy Back:  No CVA tenderness Lungs:  Clear HEART:  Regular rate rhythm, no murmurs, no rubs, no clicks Abd:  soft, positive bowel sounds, no organomegally, no rebound, no guarding Ext:  2 plus pulses, no edema, no cyanosis, no clubbing Skin:  No rashes no nodules Neuro:  CN II through XII intact, motor grossly intact   Assess/Plan:  Uncontrolled atrial fib - he is much improved on the combination of the beta blocker and digoxin.  Chronic systolic heart failure - his symptoms are much improved and I suspect that his EF will continue to get better. Continue GDMT. 3. Coags - he has not had any bleeding on eliuqis. 4. Dyslipidemia - he will continue atorvastatin. No muscle aches.   Sharlot Gowda Aida Lemaire,MD

## 2023-05-11 DIAGNOSIS — Z8551 Personal history of malignant neoplasm of bladder: Secondary | ICD-10-CM | POA: Diagnosis not present

## 2023-05-11 DIAGNOSIS — R8289 Other abnormal findings on cytological and histological examination of urine: Secondary | ICD-10-CM | POA: Diagnosis not present

## 2023-05-16 ENCOUNTER — Other Ambulatory Visit: Payer: Self-pay | Admitting: Nurse Practitioner

## 2023-05-23 ENCOUNTER — Other Ambulatory Visit: Payer: Self-pay | Admitting: Internal Medicine

## 2023-05-28 DIAGNOSIS — D692 Other nonthrombocytopenic purpura: Secondary | ICD-10-CM | POA: Diagnosis not present

## 2023-05-28 DIAGNOSIS — J449 Chronic obstructive pulmonary disease, unspecified: Secondary | ICD-10-CM | POA: Diagnosis not present

## 2023-05-28 DIAGNOSIS — R42 Dizziness and giddiness: Secondary | ICD-10-CM | POA: Diagnosis not present

## 2023-05-28 DIAGNOSIS — Z299 Encounter for prophylactic measures, unspecified: Secondary | ICD-10-CM | POA: Diagnosis not present

## 2023-05-28 DIAGNOSIS — I10 Essential (primary) hypertension: Secondary | ICD-10-CM | POA: Diagnosis not present

## 2023-05-28 DIAGNOSIS — R0683 Snoring: Secondary | ICD-10-CM | POA: Diagnosis not present

## 2023-06-03 DIAGNOSIS — G4733 Obstructive sleep apnea (adult) (pediatric): Secondary | ICD-10-CM | POA: Diagnosis not present

## 2023-06-29 ENCOUNTER — Other Ambulatory Visit: Payer: Self-pay | Admitting: Cardiology

## 2023-07-18 ENCOUNTER — Ambulatory Visit (INDEPENDENT_AMBULATORY_CARE_PROVIDER_SITE_OTHER): Payer: Medicare Other | Admitting: Otolaryngology

## 2023-07-18 ENCOUNTER — Encounter (INDEPENDENT_AMBULATORY_CARE_PROVIDER_SITE_OTHER): Payer: Self-pay | Admitting: Otolaryngology

## 2023-07-18 VITALS — Ht 72.0 in | Wt 200.0 lb

## 2023-07-18 DIAGNOSIS — H7011 Chronic mastoiditis, right ear: Secondary | ICD-10-CM | POA: Insufficient documentation

## 2023-07-18 NOTE — Progress Notes (Signed)
Patient ID: PROSPER PAFF, male   DOB: 10-Mar-1951, 72 y.o.   MRN: [id number]  Cc: Right ear chronic otomastoiditis  Procedure: Debridement of the right mastoid cavity  Indication: The patient is a 72 year old white male with a history of right canal wall down tympanomastoidectomy. He has been having recurrent right ear drainage.  He has been using ciprodex PRN. He currently denies any otalgia, fever, or significant change in his hearing.  He continues to have intermittent drainage.  Anesthesia: None  Description: The patient is placed supine on the operating table. Under the operating microscope, the right ear is examined. A large mastoid bowl is noted. A moderate amount of cerumen and squamous debris are noted within the mastoid bowl. Under the operating microscope, the cerumen and squamous debris are carefully removed with a combination of suction catheters, cerumen curette, and alligator forceps. After the debridement procedure, the mastoid bowl is noted to be mildly inflammed but with no evidence of infection or cholesteatoma. The patient tolerated the procedure well.  Follow-up care: The patient will return for repeat evaluation in approximately 4 months. The patient is instructed to keep the ear dry at all times. Ciprodex prn drainage.

## 2023-07-31 DIAGNOSIS — A6000 Herpesviral infection of urogenital system, unspecified: Secondary | ICD-10-CM | POA: Diagnosis not present

## 2023-07-31 DIAGNOSIS — I10 Essential (primary) hypertension: Secondary | ICD-10-CM | POA: Diagnosis not present

## 2023-07-31 DIAGNOSIS — R5383 Other fatigue: Secondary | ICD-10-CM | POA: Diagnosis not present

## 2023-07-31 DIAGNOSIS — G4730 Sleep apnea, unspecified: Secondary | ICD-10-CM | POA: Diagnosis not present

## 2023-07-31 DIAGNOSIS — Z299 Encounter for prophylactic measures, unspecified: Secondary | ICD-10-CM | POA: Diagnosis not present

## 2023-07-31 DIAGNOSIS — I4891 Unspecified atrial fibrillation: Secondary | ICD-10-CM | POA: Diagnosis not present

## 2023-08-23 DIAGNOSIS — Z8551 Personal history of malignant neoplasm of bladder: Secondary | ICD-10-CM | POA: Diagnosis not present

## 2023-08-28 DIAGNOSIS — H40013 Open angle with borderline findings, low risk, bilateral: Secondary | ICD-10-CM | POA: Diagnosis not present

## 2023-08-28 DIAGNOSIS — D3131 Benign neoplasm of right choroid: Secondary | ICD-10-CM | POA: Diagnosis not present

## 2023-08-28 DIAGNOSIS — H3561 Retinal hemorrhage, right eye: Secondary | ICD-10-CM | POA: Diagnosis not present

## 2023-08-28 DIAGNOSIS — H02885 Meibomian gland dysfunction left lower eyelid: Secondary | ICD-10-CM | POA: Diagnosis not present

## 2023-09-17 ENCOUNTER — Telehealth: Payer: Self-pay | Admitting: *Deleted

## 2023-09-17 ENCOUNTER — Encounter: Payer: Self-pay | Admitting: *Deleted

## 2023-09-17 DIAGNOSIS — J449 Chronic obstructive pulmonary disease, unspecified: Secondary | ICD-10-CM | POA: Diagnosis not present

## 2023-09-17 NOTE — Patient Outreach (Signed)
  Care Coordination   Follow Up Visit Note   09/17/2023 Name: [name] MRN: [id number] DOB: Nov 15, 1950  Kenneth [name] is a 72 y.o. year old male who sees Vyas, Dhruv B, MD for primary care. I spoke with  Meriel Flavors by phone today.  What matters to the patients health and wellness today?  No specific concerns endorsed today    Goals Addressed             This Visit's Progress    Care Coordination Needs       Care Management Goals: Patient will review and complete packet on healthcare power of attorney and living will Patient will reach out to RN Care Manager at [phone] with any resource or care coordination needs        SDOH assessments and interventions completed:  Yes  SDOH Interventions Today    Flowsheet Row Most Recent Value  SDOH Interventions   Food Insecurity Interventions Intervention Not Indicated  Housing Interventions Intervention Not Indicated  Transportation Interventions Intervention Not Indicated  Utilities Interventions Intervention Not Indicated  Financial Strain Interventions Intervention Not Indicated  Social Connections Interventions Intervention Not Indicated        Care Coordination Interventions:  Yes, provided  Interventions Today    Flowsheet Row Most Recent Value  Chronic Disease   Chronic disease during today's visit Atrial Fibrillation (AFib), Other  [chronic mastoiditis]  General Interventions   General Interventions Discussed/Reviewed General Interventions Discussed, General Interventions Reviewed, Annual Eye Exam  Exercise Interventions   Exercise Discussed/Reviewed Exercise Discussed, Exercise Reviewed, Physical Activity  Physical Activity Discussed/Reviewed Physical Activity Discussed, Physical Activity Reviewed  [able to perform ADLs. Easily fatigued with exertion which he relates to Afib]  Education Interventions   Education Provided Provided Education, Provided Printed Education  Provided Verbal Education On When  to see the doctor, Medication, Exercise, Eye Care  Mental Health Interventions   Mental Health Discussed/Reviewed Mental Health Discussed, Mental Health Reviewed  Pharmacy Interventions   Pharmacy Dicussed/Reviewed Pharmacy Topics Discussed, Pharmacy Topics Reviewed  [taking mediations as directed. Able to afford medications.]  Safety Interventions   Safety Discussed/Reviewed Safety Reviewed, Safety Discussed, Home Safety, Fall Risk  Advanced Directive Interventions   Advanced Directives Discussed/Reviewed Advanced Directives Discussed, Advanced Care Planning, Provided resource for acquiring and filling out documents       Follow up plan: Follow up call scheduled for 11/06/23    Encounter Outcome:  Patient Visit Completed   Demetrios Loll, RN, BSN Care Management Coordinator [facility]  [facility] Direct Dial: [phone] Main #: [phone]

## 2023-10-25 DIAGNOSIS — J449 Chronic obstructive pulmonary disease, unspecified: Secondary | ICD-10-CM | POA: Diagnosis not present

## 2023-11-06 ENCOUNTER — Encounter: Payer: Self-pay | Admitting: *Deleted

## 2023-11-13 ENCOUNTER — Encounter: Payer: Self-pay | Admitting: *Deleted

## 2023-11-13 ENCOUNTER — Ambulatory Visit: Payer: Self-pay | Admitting: *Deleted

## 2023-11-13 NOTE — Patient Outreach (Signed)
  Care Coordination   Follow Up Visit Note   11/13/2023 Name: Kenneth [name] MRN: [id number] DOB: 1951/05/01  Kenneth [name] is a 73 y.o. year old male who sees Vyas, Dhruv B, MD for primary care. I spoke with  Kenneth [name] by phone today.  What matters to the patients health and wellness today?  No specific needs endorsed today but patient would like a follow-up call from [facility].     Goals Addressed             This Visit's Progress    Care Coordination Needs   Not on track    Care Management Goals: Patient will review and complete packet on healthcare power of attorney and living will Patient will reach out to RN Care Manager at [phone] with any resource or care coordination needs        SDOH assessments and interventions completed:  Yes  SDOH Interventions Today    Flowsheet Row Most Recent Value  SDOH Interventions   Housing Interventions Intervention Not Indicated  Transportation Interventions Intervention Not Indicated        Care Coordination Interventions:  Yes, provided  Interventions Today    Flowsheet Row Most Recent Value  Chronic Disease   Chronic disease during today's visit Atrial Fibrillation (AFib), Other  [h/o bladder cancer, chronic right mastoiditis]  General Interventions   General Interventions Discussed/Reviewed General Interventions Discussed, General Interventions Reviewed, Doctor Visits, Health Screening  Doctor Visits Discussed/Reviewed Doctor Visits Discussed, Doctor Visits Reviewed, Annual Wellness Visits, PCP, Specialist  Health Screening Colonoscopy, Prostate  PCP/Specialist Visits Compliance with follow-up visit  [Patient follows-up with PCP, cardiologist, ENT, and urologist routinely as recommended. He keeps up with these appointments well]  Exercise Interventions   Exercise Discussed/Reviewed Physical Activity  Physical Activity Discussed/Reviewed Physical Activity Discussed, Physical Activity Reviewed  [able to perform  ADLs indepedently. Encouraged to increase physical activity with a goal of at least 150 minutes per week]  Education Interventions   Education Provided Provided Education  Provided Verbal Education On When to see the doctor, Exercise, Medication, Mental Health/Coping with Illness  Mental Health Interventions   Mental Health Discussed/Reviewed Mental Health Discussed, Mental Health Reviewed, Other  [h/o substance abuse. Has been in AA for more than 20 years and is doing well.]  Nutrition Interventions   Nutrition Discussed/Reviewed Nutrition Discussed, Nutrition Reviewed  Pharmacy Interventions   Pharmacy Dicussed/Reviewed Pharmacy Topics Discussed, Pharmacy Topics Reviewed, Medications and their functions  [takes medications as prescribed. Has ciprodex ear drops to use as needed as directed by Dr Teoh]  Safety Interventions   Safety Discussed/Reviewed Safety Discussed, Safety Reviewed  Advanced Directive Interventions   Advanced Directives Discussed/Reviewed Advanced Directives Discussed, Advanced Directives Reviewed, Advanced Care Planning  [previously provided Advanced Care Packet. Patient has this but has not completed. He plans to look over it and complete. Encouraged to provide a copy to one of his [facility] providers so that it can be put in his EMR.]       Follow up plan: Follow up call scheduled for 01/11/24    Encounter Outcome:  Patient Visit Completed   Josette Pellet, RN, BSN [place]  [facility], [facility] RN Care Manager Direct Dial: [phone]

## 2023-11-16 ENCOUNTER — Telehealth (INDEPENDENT_AMBULATORY_CARE_PROVIDER_SITE_OTHER): Payer: Self-pay | Admitting: Otolaryngology

## 2023-11-16 NOTE — Telephone Encounter (Signed)
 Confirmed 11/19/23 appt time and address with patient.

## 2023-11-18 ENCOUNTER — Other Ambulatory Visit: Payer: Self-pay | Admitting: Nurse Practitioner

## 2023-11-19 ENCOUNTER — Encounter (INDEPENDENT_AMBULATORY_CARE_PROVIDER_SITE_OTHER): Payer: Self-pay

## 2023-11-19 ENCOUNTER — Ambulatory Visit (INDEPENDENT_AMBULATORY_CARE_PROVIDER_SITE_OTHER): Payer: Medicaid Other | Admitting: Otolaryngology

## 2023-11-19 VITALS — BP 142/92 | HR 82 | Ht 72.0 in | Wt 190.0 lb

## 2023-11-19 DIAGNOSIS — H6121 Impacted cerumen, right ear: Secondary | ICD-10-CM | POA: Diagnosis not present

## 2023-11-19 DIAGNOSIS — H7011 Chronic mastoiditis, right ear: Secondary | ICD-10-CM

## 2023-11-19 DIAGNOSIS — H903 Sensorineural hearing loss, bilateral: Secondary | ICD-10-CM | POA: Diagnosis not present

## 2023-11-20 DIAGNOSIS — H903 Sensorineural hearing loss, bilateral: Secondary | ICD-10-CM | POA: Insufficient documentation

## 2023-11-20 DIAGNOSIS — H6121 Impacted cerumen, right ear: Secondary | ICD-10-CM | POA: Insufficient documentation

## 2023-11-20 NOTE — Progress Notes (Signed)
Patient ID: Kenneth [name], male   DOB: 05-04-1951, 73 y.o.   MRN: [id number]  Follow-up: Hearing loss, chronic right mastoiditis  HPI: The patient is a 73 year old male who returns today for his follow-up evaluation.  The patient has a history of chronic right ear otomastoiditis, status post right canal wall down tympanomastoidectomy surgery.  After the surgery, he continues to have recurrent right ear drainage.  He has been using Ciprodex eardrops as needed to treat the infection.  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  The patient was recently fitted with bilateral hearing aids.  He returns today reporting improvement in his hearing with the hearing aids.  He continues to have intermittent right ear drainage.  Exam: General: Communicates without difficulty, well nourished, no acute distress. Head: Normocephalic, no evidence injury, no tenderness, facial buttresses intact without stepoff. Face/sinus: No tenderness to palpation and percussion. Facial movement is normal and symmetric. Eyes: PERRL, EOMI. No scleral icterus, conjunctivae clear. Neuro: CN II exam reveals vision grossly intact.  No nystagmus at any point of gaze. Ears: Auricles well formed without lesions.  A large mastoid bowl is noted. A moderate amount of cerumen and squamous debris are noted to be impacted within the mastoid bowl. Under the operating microscope, the cerumen and squamous debris are carefully removed with a combination of suction catheters, cerumen curette, and alligator forceps. After the debridement procedure, the mastoid bowl is noted to be mildly inflammed but with no evidence of infection or cholesteatoma.  Nose: External evaluation reveals normal support and skin without lesions.  Dorsum is intact.  Anterior rhinoscopy reveals congested mucosa over anterior aspect of inferior turbinates and intact septum.  No purulence noted. Oral:  Oral  cavity and oropharynx are intact, symmetric, without erythema or edema.  Mucosa is moist without lesions. Neck: Full range of motion without pain.  There is no significant lymphadenopathy.  No masses palpable.  Thyroid bed within normal limits to palpation.  Parotid glands and submandibular glands equal bilaterally without mass.  Trachea is midline. Neuro:  CN 2-12 grossly intact.   Procedure: Right ear cerumen disimpaction Anesthesia: None Description: Under the operating microscope, the cerumen and squamous debris are carefully removed with a combination of suction catheters, cerumen curette, and alligator forceps. After the debridement procedure, the mastoid bowl is noted to be mildly inflammed but with no evidence of infection or cholesteatoma. The patient tolerated the procedure well.    Assessment: 1.  Chronic right ear otomastoiditis.  The patient is noted to have a large right mastoid bowl.  Cerumen and squamous debris are noted to be impacted within the mastoid bowl.  After the removal procedure, no acute infection or recurrent cholesteatoma is noted. 2.  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  Plan: 1.  Otomicroscopy with right ear cerumen disimpaction and mastoid debridement. 2.  The physical exam findings are reviewed with the patient. 3.  Continue the use of Ciprodex as needed. 4.  Continue the use of his hearing aids. 5.  The patient will return for reevaluation in 4 months.

## 2023-11-25 DIAGNOSIS — J449 Chronic obstructive pulmonary disease, unspecified: Secondary | ICD-10-CM | POA: Diagnosis not present

## 2023-11-30 DIAGNOSIS — I10 Essential (primary) hypertension: Secondary | ICD-10-CM | POA: Diagnosis not present

## 2023-11-30 DIAGNOSIS — Z299 Encounter for prophylactic measures, unspecified: Secondary | ICD-10-CM | POA: Diagnosis not present

## 2023-11-30 DIAGNOSIS — I4891 Unspecified atrial fibrillation: Secondary | ICD-10-CM | POA: Diagnosis not present

## 2023-11-30 DIAGNOSIS — B182 Chronic viral hepatitis C: Secondary | ICD-10-CM | POA: Diagnosis not present

## 2023-11-30 DIAGNOSIS — J449 Chronic obstructive pulmonary disease, unspecified: Secondary | ICD-10-CM | POA: Diagnosis not present

## 2023-11-30 DIAGNOSIS — I700 Atherosclerosis of aorta: Secondary | ICD-10-CM | POA: Diagnosis not present

## 2023-12-13 ENCOUNTER — Other Ambulatory Visit: Payer: Self-pay | Admitting: Cardiology

## 2023-12-13 ENCOUNTER — Other Ambulatory Visit: Payer: Self-pay

## 2023-12-13 MED ORDER — METOPROLOL SUCCINATE ER 25 MG PO TB24: 25.0000 mg | ORAL_TABLET | Freq: Every day | ORAL | 1 refills | Status: DC

## 2023-12-18 ENCOUNTER — Ambulatory Visit: Attending: Nurse Practitioner | Admitting: Nurse Practitioner

## 2023-12-18 ENCOUNTER — Encounter: Payer: Self-pay | Admitting: Nurse Practitioner

## 2023-12-18 VITALS — BP 116/72 | HR 90 | Ht 72.0 in | Wt 207.0 lb

## 2023-12-18 DIAGNOSIS — M79672 Pain in left foot: Secondary | ICD-10-CM | POA: Diagnosis not present

## 2023-12-18 DIAGNOSIS — I2510 Atherosclerotic heart disease of native coronary artery without angina pectoris: Secondary | ICD-10-CM

## 2023-12-18 DIAGNOSIS — E785 Hyperlipidemia, unspecified: Secondary | ICD-10-CM | POA: Diagnosis not present

## 2023-12-18 DIAGNOSIS — M79671 Pain in right foot: Secondary | ICD-10-CM

## 2023-12-18 DIAGNOSIS — I5032 Chronic diastolic (congestive) heart failure: Secondary | ICD-10-CM | POA: Diagnosis not present

## 2023-12-18 DIAGNOSIS — R200 Anesthesia of skin: Secondary | ICD-10-CM | POA: Diagnosis not present

## 2023-12-18 DIAGNOSIS — I4819 Other persistent atrial fibrillation: Secondary | ICD-10-CM

## 2023-12-18 DIAGNOSIS — I5020 Unspecified systolic (congestive) heart failure: Secondary | ICD-10-CM

## 2023-12-18 MED ORDER — APIXABAN 5 MG PO TABS: 5.0000 mg | ORAL_TABLET | Freq: Two times a day (BID) | ORAL | 1 refills | Status: AC

## 2023-12-18 MED ORDER — METOPROLOL SUCCINATE ER 25 MG PO TB24: 25.0000 mg | ORAL_TABLET | Freq: Every day | ORAL | 1 refills | Status: AC

## 2023-12-18 MED ORDER — DIGOXIN 125 MCG PO TABS: 0.1250 mg | ORAL_TABLET | Freq: Every day | ORAL | 1 refills | Status: DC

## 2023-12-18 MED ORDER — POTASSIUM CHLORIDE CRYS ER 20 MEQ PO TBCR: 20.0000 meq | EXTENDED_RELEASE_TABLET | Freq: Every day | ORAL | 1 refills | Status: DC

## 2023-12-18 MED ORDER — FUROSEMIDE 40 MG PO TABS: ORAL_TABLET | ORAL | 1 refills | Status: DC

## 2023-12-18 NOTE — Progress Notes (Unsigned)
 Cardiology Office Note:    Date:  01/23/2023  ID:  Kenneth [name], DOB [birth year], MRN [id number]  PCP:  Ignatius Specking, MD   [facility] Providers Cardiologist:  Dina Rich, MD Electrophysiologist:  Lewayne Bunting, MD     Referring MD: Ignatius Specking, MD   CC: Presyncope, Weakness, DOE follow-up  History of Present Illness:    Kenneth [name] is a 73 y.o. male with a hx of the following:  A-fib Chronic combined CHF Dyspnea on exertion Tobacco history History of bladder cancer  Monitor in 2022 showed 100% A-fib, average HR 110.  Was previously off Eliquis due to hematuria and pancytopenia r/t bladder cancer and receiving chemotherapy.  Was noted to have very severe LAE.  TTE revealed EF 55 to 60%, no WMA's, indeterminate diastolic function, mild to moderate MR, mild to moderate TR.  EF 35 to 40%, moderate RV dysfunction, severe BAE, mild MR, moderate to severe tricuspid regurgitation.  CM was suspected to be due to tachy-mediated.  Not a cardioversion candidate due to severe LAE and him being off anticoagulation in the past.  Seen by Dr. Shari Prows in April 2023.  Blood pressure was low in 80s systolic, presyncopal.  Meds adjusted.  Blood pressure improved.  He remained on Eliquis and denied any hematuria at the time.  Was seen by EP, started on digoxin.  If failed, there would be consideration for AV nodal ablation and permanent pacemaker placement.  However, did do well since adding digoxin to beta-blocker.  In August 2023 EF improved to 60 to 65%, was doing well and denied any shortness of breath or edema.  Followed by oncology and urology for bladder cancer. Recent NST was negative for any ischemia, EF 53%. Repeat TTE revealed Ef 70-75%, findings consistent with RV overload, normal PASP, moderate TR, and mild mitral MR, borderline dilated aoritc root measuring 38 mm.   Today he presents for follow-up.  Continues to note dyspnea on exertion when walking up an incline, has  to take multiple breaks, symptoms have improved since decreasing Metoprolol. Denies any chest pain, palpitations, syncope, dizziness, orthopnea, PND, swelling or significant weight changes, acute bleeding, or claudication. Tolerating his medications well.    Past Medical History:  Diagnosis Date   Anticoagulant long-term use    eliquis--- managed by cardiology   Arthritis    osteoporosis   Atrial fibrillation, unspecified type [facility]) 2018   cardiologist--- dr j. branch   Bladder neoplasm    Bradycardia    COPD (chronic obstructive pulmonary disease) (HCC)    Gallbladder polyp    followed by surgeon, dr r. cathy (lov note in epic 06-27-2021 stated surgical management after pt has completed bladder cancer treatment   GERD (gastroesophageal reflux disease)    Heart murmur    History of drug abuse in remission (HCC)    per pt in remission since 02-26-1999   History of hepatitis C 12/2020   followed by dr d. Marguerita Merles (aph/ [facility]);  dx 03/ 2022, liver bx 01-12-2021 g2hepatitis, f2;  completed 12 wks treatment 04-14-2021 w/ epclusa, hcv undetectable 04-21-2021   Mitral valve regurgitation    followed by dr branch---  last echo in epic 02-10-2021  moderate MR without stenosis   Osteoporosis    Wears dentures    full upper and lower partial   Wears hearing aid in left ear     Past Surgical History:  Procedure Laterality Date   APPENDECTOMY  age 52   BROW LIFT AND BLEPHAROPLASTY Bilateral    APPROX. 2017   CATARACT EXTRACTION W/ INTRAOCULAR LENS IMPLANT Bilateral 2018   HEMORROIDECTOMY     2010 @[facility]  and 2018 @ [facility]   HYDROCELE EXCISION Right 08/13/2017   Procedure: RIGHT HYDROCELECTOMY;  Surgeon: Malen Gauze, MD;  Location: [facility];  Service: Urology;  Laterality: Right;   HYDROCELE EXCISION Right 11/12/2017   Procedure: RIGHT HYDROCELECTOMY;  Surgeon: Malen Gauze, MD;  Location: [facility];  Service: Urology;  Laterality: Right;    INGUINAL HERNIA REPAIR Bilateral    APPROX.   2010;   WITH UMBILICAL HERNIA AND EPIGRASTIC HERNIA REPAIRS   INGUINAL HERNIA REPAIR  09/11/2011   Procedure: HERNIA REPAIR INGUINAL ADULT;  Surgeon: Marlane Hatcher;  Location: [facility];  Service: General;  Laterality: Right;  Recurrent Right Inguinal Hernia Repair   MANDIBLE RECONSTRUCTION     x2  last one 1980;   from [facility]  ( has retained hardware)   MIDDLE EAR SURGERY Right 02/19/2015   by Dr Suszanne Conners ;  tympanomastoidectomy   RIGHT/LEFT HEART CATH AND CORONARY ANGIOGRAPHY N/A 01/30/2023   Procedure: RIGHT/LEFT HEART CATH AND CORONARY ANGIOGRAPHY;  Surgeon: Marykay Lex, MD;  Location: [facility] CV [facility];  Service: Cardiovascular;  Laterality: N/A;   ROTATOR CUFF REPAIR Left 03/31/2016   TRANSURETHRAL RESECTION OF BLADDER TUMOR N/A 06/17/2021   Procedure: TRANSURETHRAL RESECTION OF BLADDER TUMOR (TURBT) WITH POST OPERATIVE INSTILLATION OF GEMCITABINE;  Surgeon: Jerilee Field, MD;  Location: [facility];  Service: Urology;  Laterality: N/A;   TRANSURETHRAL RESECTION OF BLADDER TUMOR N/A 08/16/2021   Procedure: TRANSURETHRAL RESECTION OF BLADDER TUMOR (TURBT);  Surgeon: Jerilee Field, MD;  Location: [facility];  Service: Urology;  Laterality: N/A;   TYMPANOPLASTY Right 05/02/2016   Procedure: TYMPANOPLASTY;  Surgeon: Newman Pies, MD;  Location: [facility];  Service: ENT;  Laterality: Right;   UMBILICAL HERNIA REPAIR  2017   Current Meds  Medication Sig   acetaminophen (TYLENOL) 500 MG tablet Take 500-1,000 mg by mouth every 6 (six) hours as needed for moderate pain.   alendronate (FOSAMAX) 70 MG tablet Take 70 mg by mouth once a week. Takes on Thursday's ;  Take with a full glass of water on an empty stomach.   apixaban (ELIQUIS) 5 MG TABS tablet Take 1 tablet (5 mg total) by mouth 2 (two) times daily.   Ascorbic Acid (VITAMIN C) 1000 MG tablet Take 1,000 mg by mouth 2 (two) times daily.    calcium-vitamin D (OSCAL WITH D) 500-200 MG-UNIT tablet Take 1 tablet by mouth 2 (two) times daily.   digoxin (LANOXIN) 0.125 MG tablet Take 1 tablet (0.125 mg total) by mouth daily.   furosemide (LASIX) 40 MG tablet Take 40 mg daily.May take an additional dose for led swelling or weight gain   loperamide (IMODIUM A-D) 2 MG tablet Take 2 mg by mouth 4 (four) times daily as needed for diarrhea or loose stools.   Magnesium 250 MG TABS Take 250 mg by mouth daily.   Multiple Vitamins-Minerals (MULTIVITAMIN PO) Take 1 tablet by mouth daily.   omeprazole (PRILOSEC) 20 MG capsule Take 20 mg by mouth daily.   Polyethyl Glycol-Propyl Glycol (SYSTANE OP) Place 1 drop into both eyes daily as needed (dry eyes).   potassium chloride SA (KLOR-CON M) 20 MEQ tablet Take 1 tablet by mouth once daily   pseudoephedrine (SUDAFED) 30 MG tablet Take 30 mg  by mouth daily as needed for congestion.   sildenafil (REVATIO) 20 MG tablet Take 20-60 mg by mouth daily as needed (erectile dysfunction).   tiZANidine (ZANAFLEX) 4 MG capsule Take 4 mg by mouth 2 (two) times daily as needed for muscle spasms.    metoprolol succinate (TOPROL-XL) 50 MG 24 hr tablet Take 1 tablet (50 mg total) by mouth daily. Take with or immediately following a meal.    Allergies:   Patient has no known allergies.   Social History   Socioeconomic History   Marital status: Divorced    Spouse name: Not on file   Number of children: Not on file   Years of education: Not on file   Highest education level: Not on file  Occupational History   Not on file  Tobacco Use   Smoking status: Former    Current packs/day: 0.00    Average packs/day: 1 pack/day for 21.0 years (21.0 ttl pk-yrs)    Types: Cigarettes    Start date: 09/07/1986    Quit date: 09/08/2007    Years since quitting: 16.2    Passive exposure: Never   Smokeless tobacco: Former    Quit date: 2008  Vaping Use   Vaping status: Never Used  Substance and Sexual Activity   Alcohol  use: Not Currently    Comment: quit 02-26-1999   Drug use: Not Currently    Comment: per pt quit 02-26-1999 in remission (-cocaine,Crack,heroin)   Sexual activity: Yes    Birth control/protection: None  Other Topics Concern   Not on file  Social History Narrative   Not on file   Social Drivers of Health   Financial Resource Strain: Low Risk  (09/17/2023)   Overall Financial Resource Strain (CARDIA)    Difficulty of Paying Living Expenses: Not very hard  Food Insecurity: No Food Insecurity (09/17/2023)   Hunger Vital Sign    Worried About Running Out of Food in the Last Year: Never true    Ran Out of Food in the Last Year: Never true  Transportation Needs: No Transportation Needs (11/13/2023)   PRAPARE - Administrator, Civil Service (Medical): No    Lack of Transportation (Non-Medical): No  Physical Activity: Not on file  Stress: Not on file  Social Connections: Moderately Integrated (09/17/2023)   Social Connection and Isolation Panel [NHANES]    Frequency of Communication with Friends and Family: More than three times a week    Frequency of Social Gatherings with Friends and Family: More than three times a week    Attends Religious Services: More than 4 times per year    Active Member of [employer] or Organizations: Yes    Attends Engineer, structural: More than 4 times per year    Marital Status: Divorced     Family History: The patient's family history includes Aortic aneurysm in an other family member; Cancer in an other family member; Cardiomyopathy in an other family member. There is no history of Anesthesia problems, Hypotension, Malignant hyperthermia, or Pseudochol deficiency.  ROS:     Please see the history of present illness.    All other systems reviewed and are negative.  EKGs/Labs/Other Studies Reviewed:    The following studies were reviewed today:   EKG:  EKG is not ordered today.    Echo on 12/04/2022: 1. Left ventricular ejection  fraction, by estimation, is 70 to 75%. The  left ventricle has hyperdynamic function. The left ventricle has no  regional wall motion  abnormalities. Left ventricular diastolic function  could not be evaluated. There is the  interventricular septum is flattened in systole and diastole, consistent  with right ventricular pressure and volume overload. The average left  ventricular global longitudinal strain is -22.8 %. The global longitudinal  strain is normal.   2. Right ventricular systolic function is low normal. The right  ventricular size is normal. There is normal pulmonary artery systolic  pressure. The estimated right ventricular systolic pressure is 33.0 mmHg.   3. Left atrial size was moderately dilated.   4. Right atrial size was moderately dilated.   5. The mitral valve is normal in structure. Mild mitral valve  regurgitation. No evidence of mitral stenosis.   6. Tricuspid valve regurgitation is mild to moderate.   7. The aortic valve is tricuspid. Aortic valve regurgitation is not  visualized. No aortic stenosis is present.   8. Aortic dilatation noted. There is borderline dilatation of the aortic  root, measuring 38 mm. Normal ascending aorta.   9. The inferior vena cava is normal in size with greater than 50%  respiratory variability, suggesting right atrial pressure of 3 mmHg.   Comparison(s): No significant change from prior study.   Lexiscan on 11/28/2022:    Baseline EKG showed atrial fibrillation, RBBB, frequent PVCs. Stress ECG is negative for ischemia but has frequent PVCs.   LV perfusion is normal. There is no evidence of ischemia. There is no evidence of infarction.   Left ventricular function is normal. Nuclear stress EF: 53 %. Consider 2D Echocardiogram for accurate estimation of LVEF.   The study is normal. The study is low risk.  Limited Echo on 06/08/2022:   1. Left ventricular ejection fraction, by estimation, is 60 to 65%. The  left ventricle has normal  function. The left ventricle has no regional  wall motion abnormalities. The average left ventricular global  longitudinal strain is -17.7 %. The global  longitudinal strain is normal.   2. Right ventricular systolic function is normal. The right ventricular  size is normal.   3. The aortic valve is tricuspid.   4. Limited echo to evaluate LV function   Comparison(s): Echocardiogram done 11/22/21 showed an EF of 35-40%.   Cardiac monitor on 10/11/2021: 14 day monitor Patient was in afib throughout the study, range 64-213 bpm, average HR 110 Rare ventricular ectopy in the form of isolated PVCs, couplets, triplets. Rare short episodes of wide complex tachycardia that appear more consistent with SVT with aberrancy as opposed to NSVT No symptoms reported Overall 100% afib burden with very high heart rates at times.  Recent Labs: 01/26/2023: BUN 20; Creatinine, Ser 1.14; Platelets 137 01/30/2023: Hemoglobin 13.3; Hemoglobin 12.9; Potassium 4.3; Potassium 4.1; Sodium 142; Sodium 143  Recent Lipid Panel No results found for: "CHOL", "TRIG", "HDL", "CHOLHDL", "VLDL", "LDLCALC", "LDLDIRECT"   Risk Assessment/Calculations:    CHA2DS2-VASc Score = 3   This indicates a 3.2% annual risk of stroke. The patient's score is based upon: CHF History: 1 HTN History: 0 Diabetes History: 0 Stroke History: 0 Vascular Disease History: 1 Age Score: 1 Gender Score: 0     Physical Exam:    VS:  BP 116/72   Pulse 90   Ht 6' (1.829 m)   Wt 207 lb (93.9 kg)   SpO2 98%   BMI 28.07 kg/m     Wt Readings from Last 3 Encounters:  12/18/23 207 lb (93.9 kg)  11/19/23 190 lb (86.2 kg)  07/18/23 200 lb (90.7 kg)  GEN: Well nourished, well developed in no acute distress HEENT: Normal NECK: No JVD; No carotid bruits CARDIAC: S1/S2, irregular rhythm and regular rate, no murmurs, rubs, gallops; 2+ pulses RESPIRATORY:  Clear to auscultation without rales, wheezing or rhonchi  MUSCULOSKELETAL:  No  edema; No deformity  SKIN: Warm and dry NEUROLOGIC:  Alert and oriented x 3 PSYCHIATRIC:  Normal affect   ASSESSMENT:    No diagnosis found.   PLAN:    In order of problems listed above:  Presyncope, weakness/fatigue, DOE Etiology multifactorial. Continues to have symptoms during exertion. Has to take multiple breaks with walking and doing house projects.  Recent echo revealed normal EF.  Recent NST was negative for ischemia.  Recent labs normal. Continue current medication regimen. Heart healthy diet recommended. ED precautions discussed. Case d/w Dr. Dina Rich including recommendation for right and left heart cath, and Dr. Wyline Mood agreed. Discussed risks vs benefits of procedure, patient verbalized understanding and is agreeable to proceed. Will obtain pre-procedure labs and I have write signed and held orders according to protocol. Given instructions regarding medications.   Shared Decision Making/Informed Consent The risks [stroke (1 in 1000), death (1 in 1000), kidney failure [usually temporary] (1 in 500), bleeding (1 in 200), allergic reaction [possibly serious] (1 in 200)], benefits (diagnostic support and management of coronary artery disease) and alternatives of a cardiac catheterization were discussed in detail with Mr. Saephan and he is willing to proceed.  HFimpEF TTE 11/2022 showed hyperdynamic EF, findings consistent with RV overload. Continues to admit to shortness of breath with exertion, fatigue, has to take multiple breaks, presyncopal during episodes, improvement since decreasing Metoprolol. Previously completed increased dosage of Lasix.  Euvolemic and well compensated on exam.  Continue digoxin, Lasix, potassium and supplementation.  Will reduce Toprol-XL as mentioned below.  Low sodium diet, fluid restriction <2L, and daily weights encouraged. Educated to contact our office for weight gain of 2 lbs overnight or 5 lbs in one week. ED precautions discussed.   Persistent  A-fib Denies any tachycardia or palpitations.  He is in rate controlled A-fib today.  Tolerating Eliquis well, on appropriate dosage. Continues to do well on Digoxin.  Decreasing Toprol-XL in half. No other medication changes at this time.   4. Disposition: Follow-up with me or APP in 3-4 weeks after right and left heart catheterization or sooner if anything changes.   Medication Adjustments/Labs and Tests Ordered: Current medicines are reviewed at length with the patient today.  Concerns regarding medicines are outlined above.  No orders of the defined types were placed in this encounter.  No orders of the defined types were placed in this encounter.   Patient Instructions  Medication Instructions:    Labwork:   Testing/Procedures:   Follow-Up: Your physician recommends that you schedule a follow-up appointment in:   Any Other Special Instructions Will Be Listed Below (If Applicable).  If you need a refill on your cardiac medications before your next appointment, please call your pharmacy.   Signed, Sharlene Dory, NP  12/18/2023 2:03 PM    [facility]

## 2023-12-18 NOTE — Patient Instructions (Addendum)

## 2023-12-19 ENCOUNTER — Encounter: Payer: Self-pay | Admitting: Internal Medicine

## 2023-12-23 DIAGNOSIS — J449 Chronic obstructive pulmonary disease, unspecified: Secondary | ICD-10-CM | POA: Diagnosis not present

## 2024-01-11 ENCOUNTER — Encounter: Payer: Self-pay | Admitting: *Deleted

## 2024-01-14 ENCOUNTER — Encounter: Payer: Self-pay | Admitting: *Deleted

## 2024-01-14 ENCOUNTER — Ambulatory Visit: Payer: Self-pay | Admitting: *Deleted

## 2024-01-14 NOTE — Patient Instructions (Signed)
 Visit Information  Thank you for taking time to visit with me today. Please don't hesitate to contact me if I can be of assistance to you before our next scheduled appointment.  Our next appointment is by telephone on 02/04/24 at 11:00 Please call the care guide team at [phone] if you need to cancel or reschedule your appointment.   Following is a copy of your care plan:   Goals Addressed             This Visit's Progress    VBCI RN Care Plan       Problems:  No Advanced Directives in place  Goal: Over the next 30 days the Patient will collaborate with the care management team towards completion of advanced directives (Living Will & Healthcare POA) as evidenced by verbalization.    Interventions:   (Status:  Goal on track:  NO.)  Short Term Goal Evaluation of current treatment plan related to  Afib, chronic mastoiditis, COPD, history of bladder cancer , Family and relationship dysfunction and Limited education about Advance Directives and* self-management and patient's adherence to plan as established by provider. Discussed plans with patient for ongoing care management follow up and provided patient with direct contact information for care management team Provided education to patient and/or caregiver about advanced directives Discussed plans with patient for ongoing care management follow up and provided patient with direct contact information for care management team  Patient Self-Care Activities:  Attend church or other social activities Review and complete Advance Care packet forms. Call RN Care Manager at [phone] with any questions.  Plan:  Telephone follow up appointment with care management team member scheduled for:  02/04/24 at 11:00             Please call the [place] National Suicide Prevention Lifeline: 260-130-4850 or TTY: [phone] TTY [phone]) to talk to a trained counselor call the [facility]: [phone] call 911 if  you are experiencing a Mental Health or Behavioral Health Crisis or need someone to talk to.  Patient verbalizes understanding of instructions and care plan provided today and agrees to view in MyChart. Active MyChart status and patient understanding of how to access instructions and care plan via MyChart confirmed with patient.     Kenneth Loll, RN, BSN [facility]  [facility] RN Care Manager Direct Dial: [phone]  Fax: [phone]

## 2024-01-14 NOTE — Patient Outreach (Signed)
 Complex Care Management   Visit Note  01/14/2024  Name:  Kenneth [name] MRN: [id number] DOB: 01/01/1951  Situation: Referral received for Complex Care Management related to COPD and Atrial Fibrillation. I obtained verbal consent from Meriel Flavors.  Visit completed with Meriel Flavors   on the phone  Background:   Past Medical History:  Diagnosis Date   Anticoagulant long-term use    eliquis--- managed by cardiology   Arthritis    osteoporosis   Atrial fibrillation, unspecified type [facility]) 2018   cardiologist--- dr j. branch   Bladder neoplasm    Bradycardia    COPD (chronic obstructive pulmonary disease) (HCC)    Gallbladder polyp    followed by surgeon, dr Elvera Lennox. cathy (lov note in epic 06-27-2021 stated surgical management after pt has completed bladder cancer treatment   GERD (gastroesophageal reflux disease)    Heart murmur    History of drug abuse in remission (HCC)    per pt in remission since 02-26-1999   History of hepatitis C 12/2020   followed by dr d. Marguerita Merles (aph/ [facility] and hepatology);  dx 03/ 2022, liver bx 01-12-2021 g2hepatitis, f2;  completed 12 wks treatment 04-14-2021 w/ epclusa, hcv undetectable 04-21-2021   Mitral valve regurgitation    followed by dr branch---  last echo in epic 02-10-2021  moderate MR without stenosis   Osteoporosis    Wears dentures    full upper and lower partial   Wears hearing aid in left ear     Assessment: Patient Reported Symptoms:  Cognitive Alert and oriented to person, place, and time, Normal speech and language skills, Insightful and able to interpret abstract concepts  Neurological No symptoms reported    HEENT Ear discharge, Other: 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  Cardiovascular Fatigue, Lightheadness, Palpitations, Chest pain or discomfort, Irregular pulse (presyncopal symptoms)    Respiratory No symptoms reported    Endocrine No symptoms reported    Gastrointestinal No symptoms  reported    Genitourinary No symptoms reported    Integumentary No symptoms reported    Musculoskeletal No symptoms reported    Psychosocial       Vitals:    Medications Reviewed Today     Reviewed by Gwenith Daily, RN (Registered Nurse) on 01/14/24 at 1144  Med List Status: <None>   Medication Order Taking? Sig Documenting Provider Last Dose Status Informant  acetaminophen (TYLENOL) 500 MG tablet [id number] No Take 500-1,000 mg by mouth every 6 (six) hours as needed for moderate pain. [provider] Taking Active Self  alendronate (FOSAMAX) 70 MG tablet [id number] No Take 70 mg by mouth once a week. Takes on Thursday's ;  Take with a full glass of water on an empty stomach. [provider] Taking Active Self  apixaban (ELIQUIS) 5 MG TABS tablet [id number]  Take 1 tablet (5 mg total) by mouth 2 (two) times daily. Sharlene Dory, NP  Active   Ascorbic Acid (VITAMIN C) 1000 MG tablet [id number] No Take 1,000 mg by mouth 2 (two) times daily. [provider] Taking Active Self  calcium-vitamin D (OSCAL WITH D) 500-200 MG-UNIT tablet [id number] No Take 1 tablet by mouth 2 (two) times daily. [provider] Taking Active Self  CIPROFLOXACIN-DEXAMETHASONE OT [id number] No Place 3 drops in ear(s) daily as needed (ear infection). [provider] Taking Active Self  digoxin (LANOXIN) 0.125 MG tablet [id number]  Take 1 tablet (0.125 mg total) by mouth daily. Sharlene Dory, NP  Active   furosemide (LASIX) 40 MG tablet [id number]  TAKE 1 TABLET BY MOUTH DAILY - TAKE AN ADDITIONAL DOSE FOR LOWER EDEMA SWELLING OR WEIGHT GAIN Sharlene Dory, NP  Active   loperamide (IMODIUM A-D) 2 MG tablet [id number] No Take 2 mg by mouth 4 (four) times daily as needed for diarrhea or loose stools. [provider] Taking Active Self  Magnesium 250 MG TABS [id number] No Take 250 mg by mouth daily. [provider] Taking Active Self  metoprolol succinate  (TOPROL-XL) 25 MG 24 hr tablet [id number]  Take 1 tablet (25 mg total) by mouth daily. Sharlene Dory, NP  Active   Multiple Vitamins-Minerals (MULTIVITAMIN PO) [id number] No Take 1 tablet by mouth daily. [provider] Taking Active Self  omeprazole (PRILOSEC) 20 MG capsule [id number] No Take 20 mg by mouth daily. [provider] Taking Active Self  Polyethyl Glycol-Propyl Glycol (SYSTANE OP) [id number] No Place 1 drop into both eyes daily as needed (dry eyes). [provider] Taking Active Self  potassium chloride SA (KLOR-CON M) 20 MEQ tablet [id number]  Take 1 tablet (20 mEq total) by mouth daily. Sharlene Dory, NP  Active   pseudoephedrine (SUDAFED) 30 MG tablet [id number] No Take 30 mg by mouth daily as needed for congestion. [provider] Taking Active Self  sildenafil (REVATIO) 20 MG tablet [id number] No Take 20-60 mg by mouth daily as needed (erectile dysfunction). [provider] Taking Active Self  sodium chloride flush (NS) 0.9 % injection 3 mL [id number]   Sharlene Dory, NP  Active   tiZANidine (ZANAFLEX) 4 MG capsule [id number] No Take 4 mg by mouth 2 (two) times daily as needed for muscle spasms. [provider] Taking Active Self           Med Note Lajoyce Lauber   Tue Apr 24, 2023 12:51 PM) PRN            Recommendation:   Review and complete forms in Advance Care Packet  Follow Up Plan:   Telephone follow up appointment date/time:  02/04/24 at 11:00  Demetrios Loll, RN, BSN [place]  [facility] RN Care Manager Direct Dial: [phone]  Fax: [phone]

## 2024-01-22 DIAGNOSIS — J069 Acute upper respiratory infection, unspecified: Secondary | ICD-10-CM | POA: Diagnosis not present

## 2024-01-22 DIAGNOSIS — I10 Essential (primary) hypertension: Secondary | ICD-10-CM | POA: Diagnosis not present

## 2024-01-22 DIAGNOSIS — Z299 Encounter for prophylactic measures, unspecified: Secondary | ICD-10-CM | POA: Diagnosis not present

## 2024-01-23 DIAGNOSIS — J449 Chronic obstructive pulmonary disease, unspecified: Secondary | ICD-10-CM | POA: Diagnosis not present

## 2024-02-04 ENCOUNTER — Other Ambulatory Visit: Payer: Self-pay

## 2024-02-04 ENCOUNTER — Other Ambulatory Visit: Payer: Self-pay | Admitting: *Deleted

## 2024-02-04 NOTE — Patient Instructions (Signed)
 Visit Information  Thank you for taking time to visit with me today. Please don't hesitate to contact me if I can be of assistance to you before our next scheduled appointment.  Your next care management appointment is by telephone on 03/07/24 at 10:45  Please call the care guide team at [phone] if you need to cancel, schedule, or reschedule an appointment.   Please call the [place]  National Suicide Prevention Lifeline: 367-121-3502 or TTY: (306) 552-7869 TTY [phone]) to talk to a trained counselor call the [facility]: [phone] call 911 if you are experiencing a Mental Health or Behavioral Health Crisis or need someone to talk to.  Michele Ahle, RN, BSN [place]  [facility] RN Care Manager Direct Dial: [phone]  Fax: [phone]

## 2024-02-04 NOTE — Patient Outreach (Signed)
 Complex Care Management   Visit Note  02/04/2024  Name:  Kenneth [name] MRN: [id number] DOB: 04/12/1951  Situation: Referral received for Complex Care Management related to Atrial Fibrillation and history of bladder cancer.  I obtained verbal consent from Patient.  Visit completed with [name]  on the phone  Background:   Past Medical History:  Diagnosis Date   Anticoagulant long-term use    eliquis --- managed by cardiology   Arthritis    osteoporosis   Atrial fibrillation, unspecified type [facility]) 2018   cardiologist--- dr j. branch   Bladder neoplasm    Bradycardia    COPD (chronic obstructive pulmonary disease) (HCC)    Gallbladder polyp    followed by surgeon, dr Ardeth Beckers. cathy (lov note in epic 06-27-2021 stated surgical management after pt has completed bladder cancer treatment   GERD (gastroesophageal reflux disease)    Heart murmur    History of drug abuse in remission (HCC)    per pt in remission since 02-26-1999   History of hepatitis C 12/2020   followed by dr d. Umberto Ganong (aph/ [facility] and hepatology);  dx 03/ 2022, liver bx 01-12-2021 g2hepatitis, f2;  completed 12 wks treatment 04-14-2021 w/ epclusa, hcv undetectable 04-21-2021   Mitral valve regurgitation    followed by dr branch---  last echo in epic 02-10-2021  moderate MR without stenosis   Osteoporosis    Wears dentures    full upper and lower partial   Wears hearing aid in left ear     Assessment: Patient Reported Symptoms:  Cognitive Cognitive Status: Alert and oriented to person, place, and time, Normal speech and language skills Cognitive/Intellectual Conditions Management [RPT]: None reported or documented in medical history or problem list   Health Maintenance Behaviors: Annual physical exam, Healthy diet, Social activities, Sleep adequate, Immunizations, Spiritual practice(s) Healing Pattern: Unsure Health Facilitated by: Healthy diet, Prayer/meditation  Neurological Neurological  Review of Symptoms: No symptoms reported    HEENT HEENT Symptoms Reported: Ear discharge Other HEENT Symptoms/Conditions: 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 Followed by ENT regularly. HEENT Conditions: Ear problem(s) HEENT Management Strategies: Medication therapy, Routine screening HEENT Self-Management Outcome: 4 (good) HEENT Comment: chronic mastoiditis Ear problem(s)  Cardiovascular Cardiovascular Symptoms Reported: Fatigue, Lightheadness Does patient have uncontrolled Hypertension?: No Cardiovascular Conditions: Dysrhythmia Cardiovascular Management Strategies: Medication therapy, Routine screening, Coping strategies Cardiovascular Self-Management Outcome: 4 (good) Cardiovascular Comment: Intermittent symptoms a few times per week. Mainly presyncopal symptoms. Does not normally notice palpitations or abnormal heart rate/rhythm. Not associated with anything and resolves quickly with rest. Managed by cardiologist. Taking Digoxin  and metoprolol .  Respiratory Respiratory Symptoms Reported: No symptoms reported    Endocrine Patient reports the following symptoms related to hypoglycemia or hyperglycemia : No symptoms reported    Gastrointestinal Gastrointestinal Symptoms Reported: No symptoms reported      Genitourinary Genitourinary Symptoms Reported: No symptoms reported Genitourinary Conditions: Bladder cancer (history of bladder cancer.) Genitourinary Management Strategies: Coping strategies Genitourinary Self-Management Outcome: 4 (good) Genitourinary Comment: Followed by Dr Derrick Fling at [facility] every 3-4 months.  Integumentary Integumentary Symptoms Reported: No symptoms reported    Musculoskeletal Musculoskelatal Symptoms Reviewed: No symptoms reported        Psychosocial   Behavioral Health Conditions: Other (history of substance abuse) Other Behavorial Health Conditions: History of substance abuse; in remission. Behavioral Management  Strategies: Support system, Support group, Abstinence from substances Behavioral Health Self-Management Outcome: 5 (very good) Behavioral Health Comment: Attends daily AA meetings and is very active  in his organizations Major Change/Loss/Stressor/Fears (CP): Denies Techniques to [employer] with Loss/Stress/Change: Support group Quality of Family Relationships: non-existent (has a son but it's difficult to get in contact with him. He has helped some in the past, but he can't depend on him to assist if needed. He does have friends that he can depend on.)      02/04/2024   12:32 PM  Depression screen PHQ 2/9  Decreased Interest 0  Down, Depressed, Hopeless 0  PHQ - 2 Score 0    There were no vitals filed for this visit.  Medications Reviewed Today     Reviewed by Ethelene Herald, RN (Registered Nurse) on 02/04/24 at 1203  Med List Status: <None>   Medication Order Taking? Sig Documenting Provider Last Dose Status Informant  acetaminophen  (TYLENOL ) 500 MG tablet [id number] Yes Take 500-1,000 mg by mouth every 6 (six) hours as needed for moderate pain. [provider] Taking Active Self  alendronate (FOSAMAX) 70 MG tablet [id number] Yes Take 70 mg by mouth once a week. Takes on Thursday's ;  Take with a full glass of water  on an empty stomach. [provider] Taking Active Self  apixaban  (ELIQUIS ) 5 MG TABS tablet [id number] Yes Take 1 tablet (5 mg total) by mouth 2 (two) times daily. Lasalle Pointer, NP Taking Active   Ascorbic Acid (VITAMIN C) 1000 MG tablet [id number] Yes Take 1,000 mg by mouth 2 (two) times daily. [provider] Taking Active Self  calcium -vitamin D (OSCAL WITH D) 500-200 MG-UNIT tablet [id number] Yes Take 1 tablet by mouth 2 (two) times daily. [provider] Taking Active Self  CIPROFLOXACIN -DEXAMETHASONE  OT [id number] Yes Place 3 drops in ear(s) daily as needed (ear infection). [provider] Taking Active Self  digoxin  (LANOXIN ) 0.125  MG tablet [id number] Yes Take 1 tablet (0.125 mg total) by mouth daily. Lasalle Pointer, NP Taking Active   furosemide  (LASIX ) 40 MG tablet [id number] Yes TAKE 1 TABLET BY MOUTH DAILY - TAKE AN ADDITIONAL DOSE FOR LOWER EDEMA SWELLING OR WEIGHT GAIN Lasalle Pointer, NP Taking Active   loperamide (IMODIUM A-D) 2 MG tablet [id number] Yes Take 2 mg by mouth 4 (four) times daily as needed for diarrhea or loose stools. [provider] Taking Active Self  Magnesium  250 MG TABS [id number] Yes Take 250 mg by mouth daily. [provider] Taking Active Self  metoprolol  succinate (TOPROL -XL) 25 MG 24 hr tablet [id number] Yes Take 1 tablet (25 mg total) by mouth daily. Lasalle Pointer, NP Taking Active   Multiple Vitamins-Minerals (MULTIVITAMIN PO) [id number] Yes Take 1 tablet by mouth daily. [provider] Taking Active Self  omeprazole (PRILOSEC) 20 MG capsule [id number] Yes Take 20 mg by mouth daily. [provider] Taking Active Self  Polyethyl Glycol-Propyl Glycol (SYSTANE OP) [id number] Yes Place 1 drop into both eyes daily as needed (dry eyes). [provider] Taking Active Self  potassium chloride  SA (KLOR-CON  M) 20 MEQ tablet [id number] Yes Take 1 tablet (20 mEq total) by mouth daily. Lasalle Pointer, NP Taking Active   pseudoephedrine (SUDAFED) 30 MG tablet [id number] Yes Take 30 mg by mouth daily as needed for congestion. [provider] Taking Active Self  sildenafil (REVATIO) 20 MG tablet [id number] Yes Take 20-60 mg by mouth daily as needed (erectile dysfunction). [provider] Taking Active Self  sodium chloride  flush (NS) 0.9 % injection 3 mL [id number]   Lasalle Pointer, NP  Active   tiZANidine (ZANAFLEX) 4 MG capsule [id number] Yes Take 4 mg  by mouth 2 (two) times daily as needed for muscle spasms. [provider] Taking Active Self           Med Note Rip Cheese   Tue Apr 24, 2023 12:51 PM) PRN            Recommendation:    Keep current medical appointments and follow-up with provider with any new or worsening symptoms.  Follow Up Plan:   Telephone follow up appointment date/time:  03/07/24 at 10:45  Michele Ahle, RN, BSN [place]  [facility] RN Care Manager Direct Dial: [phone]  Fax: [phone]

## 2024-02-18 DIAGNOSIS — C673 Malignant neoplasm of anterior wall of bladder: Secondary | ICD-10-CM | POA: Diagnosis not present

## 2024-02-18 DIAGNOSIS — Z8551 Personal history of malignant neoplasm of bladder: Secondary | ICD-10-CM | POA: Diagnosis not present

## 2024-02-22 DIAGNOSIS — J449 Chronic obstructive pulmonary disease, unspecified: Secondary | ICD-10-CM | POA: Diagnosis not present

## 2024-02-25 DIAGNOSIS — D3131 Benign neoplasm of right choroid: Secondary | ICD-10-CM | POA: Diagnosis not present

## 2024-02-25 DIAGNOSIS — H3561 Retinal hemorrhage, right eye: Secondary | ICD-10-CM | POA: Diagnosis not present

## 2024-03-06 DIAGNOSIS — Z79899 Other long term (current) drug therapy: Secondary | ICD-10-CM | POA: Diagnosis not present

## 2024-03-06 DIAGNOSIS — Z125 Encounter for screening for malignant neoplasm of prostate: Secondary | ICD-10-CM | POA: Diagnosis not present

## 2024-03-06 DIAGNOSIS — Z1331 Encounter for screening for depression: Secondary | ICD-10-CM | POA: Diagnosis not present

## 2024-03-06 DIAGNOSIS — Z299 Encounter for prophylactic measures, unspecified: Secondary | ICD-10-CM | POA: Diagnosis not present

## 2024-03-06 DIAGNOSIS — Z0000 Encounter for general adult medical examination without abnormal findings: Secondary | ICD-10-CM | POA: Diagnosis not present

## 2024-03-06 DIAGNOSIS — Z1339 Encounter for screening examination for other mental health and behavioral disorders: Secondary | ICD-10-CM | POA: Diagnosis not present

## 2024-03-06 DIAGNOSIS — E7800 Pure hypercholesterolemia, unspecified: Secondary | ICD-10-CM | POA: Diagnosis not present

## 2024-03-06 DIAGNOSIS — Z7189 Other specified counseling: Secondary | ICD-10-CM | POA: Diagnosis not present

## 2024-03-06 DIAGNOSIS — I10 Essential (primary) hypertension: Secondary | ICD-10-CM | POA: Diagnosis not present

## 2024-03-06 DIAGNOSIS — R5383 Other fatigue: Secondary | ICD-10-CM | POA: Diagnosis not present

## 2024-03-07 ENCOUNTER — Encounter: Payer: Self-pay | Admitting: *Deleted

## 2024-03-07 ENCOUNTER — Other Ambulatory Visit: Payer: Self-pay

## 2024-03-07 ENCOUNTER — Other Ambulatory Visit: Payer: Self-pay | Admitting: *Deleted

## 2024-03-07 NOTE — Patient Outreach (Unsigned)
 Complex Care Management   Visit Note  03/07/2024  Name:  Kenneth [name] MRN: [id number] DOB: Jun 19, 1951  Situation: Referral received for Complex Care Management related to Atrial Fibrillation I obtained verbal consent from Patient.  Visit completed with Kenneth [name] the phone  Background:   Past Medical History:  Diagnosis Date   Anticoagulant long-term use    eliquis --- managed by cardiology   Arthritis    osteoporosis   Atrial fibrillation, unspecified type [facility]) 2018   cardiologist--- Kenneth Kenneth. [name]   Bladder neoplasm    Bradycardia    COPD (chronic obstructive pulmonary disease) (HCC)    Gallbladder polyp    followed by surgeon, Kenneth Kenneth. [name] (lov note in epic 06-27-2021 stated surgical management after pt has completed bladder cancer treatment   GERD (gastroesophageal reflux disease)    Heart murmur    History of drug abuse in remission (HCC)    per pt in remission since 02-26-1999   History of hepatitis C 12/2020   followed by Kenneth Kenneth. Umberto [name] (aph/ [facility] and hepatology);  dx 03/ 2022, liver bx 01-12-2021 g2hepatitis, f2;  completed 12 wks treatment 04-14-2021 w/ epclusa, hcv undetectable 04-21-2021   Mitral valve regurgitation    followed by Kenneth [name]---  last echo in epic 02-10-2021  moderate MR without stenosis   Osteoporosis    Wears dentures    full upper and lower partial   Wears hearing aid in left ear     Assessment: Patient Reported Symptoms:  Cognitive Cognitive Status: Alert and oriented to person, place, and time, Normal speech and language skills Cognitive/Intellectual Conditions Management [RPT]: None reported or documented in medical history or problem list   Health Maintenance Behaviors: Annual physical exam, Healthy diet, Sleep adequate, Immunizations Healing Pattern: Unsure Health Facilitated by: Healthy diet, Rest  Neurological Neurological Review of Symptoms: No symptoms reported    HEENT HEENT Symptoms Reported: Not assessed       Cardiovascular Cardiovascular Symptoms Reported: Chest pain or discomfort, Lightheadness, Other:, Fatigue (denies chest pain but has episodes of a "warm, tight feeling") Other Cardiovascular Symptoms: Presyncope Does patient have uncontrolled Hypertension?: No Cardiovascular Conditions: Dysrhythmia, Heart failure Cardiovascular Management Strategies: Medication therapy, Routine screening, Coping strategies Cardiovascular Self-Management Outcome: 4 (good) Cardiovascular Comment: Intermittent symptoms a few times per week. Mainly presyncopal symptoms & dyspnea on exertion. Resolves after resting for about a minute. Does not notice palpitations or abnormal heart rate/rhythm. Not associated with anything and resolves quickly with rest. Managed by cardiologist. Appt. scheduled for 04/03/24. Taking Digoxin  and metoprolol . Staff message sent to cardiologist regarding presyncope. Questioned if heart monitor woud be appropriate and advised that patient would like to know if an ablation would be appropriate.  Respiratory Respiratory Symptoms Reported: No symptoms reported    Endocrine Patient reports the following symptoms related to hypoglycemia or hyperglycemia : No symptoms reported    Gastrointestinal Gastrointestinal Symptoms Reported: No symptoms reported      Genitourinary Genitourinary Symptoms Reported: No symptoms reported Genitourinary Conditions: Other Other Genitourinary Conditions: History of bladder cancer Genitourinary Management Strategies: Coping strategies Genitourinary Self-Management Outcome: 4 (good) Genitourinary Comment: followed by Kenneth [name] at [facility] every 3-4 months.  Integumentary Integumentary Symptoms Reported: Not assessed    Musculoskeletal Musculoskelatal Symptoms Reviewed: Not assessed        Psychosocial Psychosocial Symptoms Reported: No symptoms reported Other Behavorial Health Conditions: History of substance abuse; in remission Behavioral  Management Strategies: Support group, Support system, Abstinence from substances Behavioral Health  Self-Management Outcome: 5 (very good) Behavioral Health Comment: attends daily AA meetings and is very active in his organizations Major Change/Loss/Stressor/Fears (CP): Denies Do you feel physically threatened by others?: No      02/04/2024   12:32 PM  Depression screen PHQ 2/9  Decreased Interest 0  Down, Depressed, Hopeless 0  PHQ - 2 Score 0    Vitals:   03/07/24 1143  BP: 120/70  Pulse: 73  SpO2: 96%    Medications Reviewed Today     Reviewed by Kenneth Herald, RN (Registered Nurse) on 03/07/24 at 1105  Med List Status: <None>   Medication Order Taking? Sig Documenting Provider Last Dose Status Informant  acetaminophen  (TYLENOL ) 500 MG tablet [id number] Yes Take 500-1,000 mg by mouth every 6 (six) hours as needed for moderate pain. [provider] Taking Active Self  alendronate (FOSAMAX) 70 MG tablet [id number] Yes Take 70 mg by mouth once a week. Takes on Thursday's ;  Take with a full glass of water  on an empty stomach. [provider] Taking Active Self  apixaban  (ELIQUIS ) 5 MG TABS tablet [id number] Yes Take 1 tablet (5 mg total) by mouth 2 (two) times daily. Kenneth Pointer, NP Taking Active   Ascorbic Acid (VITAMIN C) 1000 MG tablet [id number] Yes Take 1,000 mg by mouth 2 (two) times daily. [provider] Taking Active Self  BREZTRI AEROSPHERE 160-9-4.8 MCG/ACT AERO inhaler [id number] Yes Inhale 1 puff into the lungs in the morning and at bedtime. [provider] Taking Active   calcium -vitamin Kenneth (OSCAL WITH Kenneth) 500-200 MG-UNIT tablet [id number] Yes Take 1 tablet by mouth 2 (two) times daily. [provider] Taking Active Self  CIPROFLOXACIN -DEXAMETHASONE  OT [id number] Yes Place 3 drops in ear(s) daily as needed (ear infection). [provider] Taking Active Self  digoxin  (LANOXIN ) 0.125 MG tablet [id number] Yes Take 1 tablet  (0.125 mg total) by mouth daily. Kenneth Pointer, NP Taking Active   furosemide  (LASIX ) 40 MG tablet [id number] Yes TAKE 1 TABLET BY MOUTH DAILY - TAKE AN ADDITIONAL DOSE FOR LOWER EDEMA SWELLING OR WEIGHT GAIN Kenneth Pointer, NP Taking Active   loperamide (IMODIUM A-Kenneth) 2 MG tablet [id number] Yes Take 2 mg by mouth 4 (four) times daily as needed for diarrhea or loose stools. [provider] Taking Active Self  Magnesium  250 MG TABS [id number] Yes Take 250 mg by mouth daily. [provider] Taking Active Self  metoprolol  succinate (TOPROL -XL) 25 MG 24 hr tablet [id number] Yes Take 1 tablet (25 mg total) by mouth daily. Kenneth Pointer, NP Taking Active   Multiple Vitamins-Minerals (MULTIVITAMIN PO) [id number] Yes Take 1 tablet by mouth daily. [provider] Taking Active Self  omeprazole (PRILOSEC) 20 MG capsule [id number] Yes Take 20 mg by mouth daily. [provider] Taking Active Self  Polyethyl Glycol-Propyl Glycol (SYSTANE OP) [id number] Yes Place 1 drop into both eyes daily as needed (dry eyes). [provider] Taking Active Self  potassium chloride  SA (KLOR-CON  M) 20 MEQ tablet [id number] Yes Take 1 tablet (20 mEq total) by mouth daily. Kenneth Pointer, NP Taking Active   pseudoephedrine (SUDAFED) 30 MG tablet [id number] Yes Take 30 mg by mouth daily as needed for congestion. [provider] Taking Active Self  sildenafil (REVATIO) 20 MG tablet [id number] Yes Take 20-60 mg by mouth daily as needed (erectile dysfunction). [provider] Taking Active Self  sodium chloride  flush (NS) 0.9 % injection 3 mL [id number]   Kenneth Pointer, NP  Active   tiZANidine (  ZANAFLEX) 4 MG capsule [id number] Yes Take 4 mg by mouth 2 (two) times daily as needed for muscle spasms. [provider] Taking Active Self           Med Note Rip Cheese   Tue Apr 24, 2023 12:51 PM) PRN            Recommendation:   Specialty provider follow-up  cardiologist on 04/03/24. Discuss treatment/management options for AFib Screening CT Lung (due in 2024) Complete Advance Care packet  Follow Up Plan:   Telephone follow up appointment date/time:  03/14/24 at 9:30  Michele Ahle, RN, BSN [place]  [facility] RN Care Manager Direct Dial: [phone]  Fax: [phone]

## 2024-03-17 ENCOUNTER — Other Ambulatory Visit: Payer: Self-pay

## 2024-03-17 ENCOUNTER — Encounter: Payer: Self-pay | Admitting: *Deleted

## 2024-03-17 ENCOUNTER — Other Ambulatory Visit: Payer: Self-pay | Admitting: *Deleted

## 2024-03-17 VITALS — BP 122/80 | HR 81

## 2024-03-17 DIAGNOSIS — J449 Chronic obstructive pulmonary disease, unspecified: Secondary | ICD-10-CM

## 2024-03-17 NOTE — Patient Instructions (Signed)
 Visit Information  Thank you for taking time to visit with me today. Please don't hesitate to contact me if I can be of assistance to you before our next scheduled appointment.  Your next care management appointment is by telephone on 04/04/24 at 4:00  Please call the care guide team at [phone] if you need to cancel, schedule, or reschedule an appointment.   Please call the [facility]: [phone] call 911 if you are experiencing a Mental Health or Behavioral Health Crisis or need someone to talk to.  Michele Ahle, RN, BSN [place]  [facility] RN Care Manager Direct Dial: [phone]  Fax: [phone]

## 2024-03-17 NOTE — Patient Outreach (Signed)
 Complex Care Management   Visit Note  03/17/2024  Name:  Kenneth [name] MRN: [id number] DOB: 08/24/1951  Situation: Referral received for Complex Care Management related to Atrial Fibrillation I obtained verbal consent from Patient.  Visit completed with Kenneth [name] on the phone  Background:   Past Medical History:  Diagnosis Date   Anticoagulant long-term use    eliquis --- managed by cardiology   Arthritis    osteoporosis   Atrial fibrillation, unspecified type [facility]) 2018   cardiologist--- dr j. branch   Bladder neoplasm    Bradycardia    COPD (chronic obstructive pulmonary disease) (HCC)    Gallbladder polyp    followed by surgeon, dr Kenneth [name]. Kenneth [name] (lov note in epic 06-27-2021 stated surgical management after pt has completed bladder cancer treatment   GERD (gastroesophageal reflux disease)    Heart murmur    History of drug abuse in remission (HCC)    per pt in remission since 02-26-1999   History of hepatitis C 12/2020   followed by dr Kenneth [name] (aph/ [facility]);  dx 03/ 2022, liver bx 01-12-2021 g2hepatitis, f2;  completed 12 wks treatment 04-14-2021 w/ epclusa, hcv undetectable 04-21-2021   Mitral valve regurgitation    followed by dr branch---  last echo in epic 02-10-2021  moderate MR without stenosis   Osteoporosis    Wears dentures    full upper and lower partial   Wears hearing aid in left ear     Assessment: Patient Reported Symptoms:  Cognitive Cognitive Status: Alert and oriented to person, place, and time, Normal speech and language skills Cognitive/Intellectual Conditions Management [RPT]: None reported or documented in medical history or problem list   Health Maintenance Behaviors: Annual physical exam, Healthy diet, Sleep adequate, Immunizations Healing Pattern: Unsure Health Facilitated by: Healthy diet, Rest  Neurological Neurological Review of Symptoms: Not assessed    HEENT HEENT Symptoms Reported: Not assessed       Cardiovascular Cardiovascular Symptoms Reported: No symptoms reported Other Cardiovascular Symptoms: reports no presyncopal episodes over the past 5 days, which is an improvement Does patient have uncontrolled Hypertension?: No Cardiovascular Conditions: Dysrhythmia, Heart failure Cardiovascular Management Strategies: Routine screening, Medication therapy, Coping strategies Cardiovascular Self-Management Outcome: 4 (good) Cardiovascular Comment: follow-up with cardiologist on 04/03/24. Report any new or worsening symptoms and keep a log of episodes of presyncope  Respiratory Respiratory Symptoms Reported: No symptoms reported Additional Respiratory Details: initially had some chest congestion when he started Breztri. That has resolved. Has not had any presyncopal episodes in the last 5 days and dyspnea with exertion seems better. Respiratory Conditions: COPD (Per [facility] report. Entered by PCP on 03/06/24.) Respiratory Self-Management Outcome: 4 (good) Respiratory Comment: due for screening CT lungs  Endocrine Patient reports the following symptoms related to hypoglycemia or hyperglycemia : No symptoms reported    Gastrointestinal Gastrointestinal Symptoms Reported: Not assessed      Genitourinary Genitourinary Symptoms Reported: Not assessed    Integumentary Integumentary Symptoms Reported: Not assessed    Musculoskeletal Musculoskelatal Symptoms Reviewed: Not assessed        Psychosocial Psychosocial Symptoms Reported: No symptoms reported            02/04/2024   12:32 PM  Depression screen PHQ 2/9  Decreased Interest 0  Down, Depressed, Hopeless 0  PHQ - 2 Score 0    Vitals:   03/17/24 1017  BP: 122/80  Pulse: 81  SpO2: 97%    Medications Reviewed Today  Reviewed by Kenneth Herald, RN (Registered Nurse) on 03/17/24 at 1016  Med List Status: <None>   Medication Order Taking? Sig Documenting Provider Last Dose Status Informant  acetaminophen  (TYLENOL ) 500 MG tablet  [id number] Yes Take 500-1,000 mg by mouth every 6 (six) hours as needed for moderate pain. [provider] Taking Active Self  alendronate (FOSAMAX) 70 MG tablet [id number] Yes Take 70 mg by mouth once a week. Takes on Thursday's ;  Take with a full glass of water  on an empty stomach. [provider] Taking Active Self  apixaban  (ELIQUIS ) 5 MG TABS tablet [id number] Yes Take 1 tablet (5 mg total) by mouth 2 (two) times daily. Lasalle Pointer, NP Taking Active   Ascorbic Acid (VITAMIN C) 1000 MG tablet [id number] Yes Take 1,000 mg by mouth 2 (two) times daily. [provider] Taking Active Self  BREZTRI AEROSPHERE 160-9-4.8 MCG/ACT AERO inhaler [id number] Yes Inhale 1 puff into the lungs in the morning and at bedtime. [provider] Taking Active   calcium -vitamin D (OSCAL WITH D) 500-200 MG-UNIT tablet [id number] Yes Take 1 tablet by mouth 2 (two) times daily. [provider] Taking Active Self  CIPROFLOXACIN -DEXAMETHASONE  OT [id number] Yes Place 3 drops in ear(s) daily as needed (ear infection). [provider] Taking Active Self  digoxin  (LANOXIN ) 0.125 MG tablet [id number] Yes Take 1 tablet (0.125 mg total) by mouth daily. Lasalle Pointer, NP Taking Active   furosemide  (LASIX ) 40 MG tablet [id number] Yes TAKE 1 TABLET BY MOUTH DAILY - TAKE AN ADDITIONAL DOSE FOR LOWER EDEMA SWELLING OR WEIGHT GAIN Lasalle Pointer, NP Taking Active   loperamide (IMODIUM A-D) 2 MG tablet [id number] Yes Take 2 mg by mouth 4 (four) times daily as needed for diarrhea or loose stools. [provider] Taking Active Self  Magnesium  250 MG TABS [id number] Yes Take 250 mg by mouth daily. [provider] Taking Active Self  metoprolol  succinate (TOPROL -XL) 25 MG 24 hr tablet [id number] Yes Take 1 tablet (25 mg total) by mouth daily. Lasalle Pointer, NP Taking Active   Multiple Vitamins-Minerals (MULTIVITAMIN PO) [id number] Yes Take 1 tablet by mouth daily. [provider] Taking Active Self  omeprazole (PRILOSEC) 20 MG capsule [id number] Yes Take 20 mg by mouth daily. [provider] Taking Active Self  Polyethyl Glycol-Propyl Glycol (SYSTANE OP) [id number] Yes Place 1 drop into both eyes daily as needed (dry eyes). [provider] Taking Active Self  potassium chloride  SA (KLOR-CON  M) 20 MEQ tablet [id number] Yes Take 1 tablet (20 mEq total) by mouth daily. Lasalle Pointer, NP Taking Active   pseudoephedrine (SUDAFED) 30 MG tablet [id number] Yes Take 30 mg by mouth daily as needed for congestion. [provider] Taking Active Self  sildenafil (REVATIO) 20 MG tablet [id number] Yes Take 20-60 mg by mouth daily as needed (erectile dysfunction). [provider] Taking Active Self  sodium chloride  flush (NS) 0.9 % injection 3 mL [id number]   Lasalle Pointer, NP  Active   tiZANidine (ZANAFLEX) 4 MG capsule [id number] Yes Take 4 mg by mouth 2 (two) times daily as needed for muscle spasms. [provider] Taking Active Self           Med Note Rip Cheese   Tue Apr 24, 2023 12:51 PM) PRN            Recommendation:   Specialty provider follow-up Dr Carolynne Citron (cardiologist) 04/03/24  Follow Up Plan:   Telephone follow up appointment date/time:  04/04/24 at 4:00  [facility]  Gardenia Jump, RN, BSN [place]  [facility] Population Health RN Care Manager Direct Dial: [phone]  Fax: [phone]

## 2024-03-19 ENCOUNTER — Ambulatory Visit (INDEPENDENT_AMBULATORY_CARE_PROVIDER_SITE_OTHER): Payer: Medicare PPO | Admitting: Otolaryngology

## 2024-03-24 DIAGNOSIS — J449 Chronic obstructive pulmonary disease, unspecified: Secondary | ICD-10-CM | POA: Diagnosis not present

## 2024-03-26 ENCOUNTER — Ambulatory Visit (INDEPENDENT_AMBULATORY_CARE_PROVIDER_SITE_OTHER): Admitting: Otolaryngology

## 2024-03-26 ENCOUNTER — Encounter (INDEPENDENT_AMBULATORY_CARE_PROVIDER_SITE_OTHER): Payer: Self-pay | Admitting: Otolaryngology

## 2024-03-26 VITALS — BP 115/76 | HR 68 | Ht 72.0 in | Wt 200.0 lb

## 2024-03-26 DIAGNOSIS — H903 Sensorineural hearing loss, bilateral: Secondary | ICD-10-CM | POA: Diagnosis not present

## 2024-03-26 DIAGNOSIS — H7011 Chronic mastoiditis, right ear: Secondary | ICD-10-CM | POA: Diagnosis not present

## 2024-03-26 DIAGNOSIS — H9011 Conductive hearing loss, unilateral, right ear, with unrestricted hearing on the contralateral side: Secondary | ICD-10-CM | POA: Diagnosis not present

## 2024-03-27 NOTE — Progress Notes (Signed)
 Patient ID: Kenneth [name], male   DOB: 03-14-1951, 73 y.o.   MRN: [id number]  Follow-up: Chronic right mastoiditis  HPI: The patient is a 73 year old male who returns today for his follow-up evaluation.  The patient has a history of chronic right ear otomastoiditis.  He was treated with right canal wall down tympanomastoidectomy surgery.  Over the past few years, he has been experiencing intermittent right ear drainage.  He has been using Ciprodex eardrops as needed.  The patient returns today reporting no significant difficulty over the past 4 months.  He denies any significant otalgia or otorrhea.  He also denies any recent change in his hearing.  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  Exam: General: Communicates without difficulty, well nourished, no acute distress. Head: Normocephalic, no evidence injury, no tenderness, facial buttresses intact without stepoff. Face/sinus: No tenderness to palpation and percussion. Facial movement is normal and symmetric. Eyes: PERRL, EOMI. No scleral icterus, conjunctivae clear. Neuro: CN II exam reveals vision grossly intact.  No nystagmus at any point of gaze. Ears: Auricles well formed without lesions.  A large mastoid bowl is noted. The mastoid bowl is noted to be mildly inflammed but with no evidence of infection or cholesteatoma.  Nose: External evaluation reveals normal support and skin without lesions.  Dorsum is intact.  Anterior rhinoscopy reveals congested mucosa over anterior aspect of inferior turbinates and intact septum.  No purulence noted. Oral:  Oral cavity and oropharynx are intact, symmetric, without erythema or edema.  Mucosa is moist without lesions. Neck: Full range of motion without pain.  There is no significant lymphadenopathy.  No masses palpable.  Thyroid  bed within normal limits to palpation.  Parotid glands and submandibular glands equal bilaterally without mass.   Trachea is midline. Neuro:  CN 2-12 grossly intact.   Assessment: 1.  Chronic right ear otomastoiditis.  His right mastoid bowl is noted to be mildly inflamed but without any evidence of acute infection or recurrent cholesteatoma. 3.  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  Plan: 1.  The physical exam findings are reviewed with the patient. 2.  Continue the use of Ciprodex as needed. 3.  Continue the use of his hearing aids. 4.  The patient will return for reevaluation in 4 months.

## 2024-04-03 ENCOUNTER — Encounter: Payer: Self-pay | Admitting: Internal Medicine

## 2024-04-03 ENCOUNTER — Ambulatory Visit: Attending: Internal Medicine

## 2024-04-03 ENCOUNTER — Ambulatory Visit: Attending: Internal Medicine | Admitting: Internal Medicine

## 2024-04-03 VITALS — BP 114/78 | HR 79 | Ht 72.0 in | Wt 204.6 lb

## 2024-04-03 DIAGNOSIS — I483 Typical atrial flutter: Secondary | ICD-10-CM | POA: Diagnosis not present

## 2024-04-03 DIAGNOSIS — I4819 Other persistent atrial fibrillation: Secondary | ICD-10-CM

## 2024-04-03 NOTE — Patient Instructions (Signed)
 Medication Instructions:  Your physician recommends that you continue on your current medications as directed. Please refer to the Current Medication list given to you today.  *If you need a refill on your cardiac medications before your next appointment, please call your pharmacy*  Lab Work: NONE   If you have labs (blood work) drawn today and your tests are completely normal, you will receive your results only by: MyChart Message (if you have MyChart) OR A paper copy in the mail If you have any lab test that is abnormal or we need to change your treatment, we will call you to review the results.  Testing/Procedures: ZIO XT- Long Term Monitor Instructions   Your physician has requested you wear your ZIO patch monitor___3____days.   This is a single patch monitor.  [employer] supplies one patch monitor per enrollment.  Additional stickers are not available.   Please do not apply patch if you will be having a Nuclear Stress Test, Echocardiogram, Cardiac CT, MRI, or Chest Xray during the time frame you would be wearing the monitor. The patch cannot be worn during these tests.  You cannot remove and re-apply the ZIO XT patch monitor.   Your ZIO patch monitor will be sent USPS Priority mail from [facility] directly to your home address. The monitor may also be mailed to a PO BOX if home delivery is not available.   It may take 3-5 days to receive your monitor after you have been enrolled.   Once you have received you monitor, please review enclosed instructions.  Your monitor has already been registered assigning a specific monitor serial # to you.   Applying the monitor   Shave hair from upper left chest.   Hold abrader disc by orange tab.  Rub abrader in 40 strokes over left upper chest as indicated in your monitor instructions.   Clean area with 4 enclosed alcohol  pads .  Use all pads to assure are is cleaned thoroughly.  Let dry.   Apply patch as indicated in monitor  instructions.  Patch will be place under collarbone on left side of chest with arrow pointing upward.   Rub patch adhesive wings for 2 minutes.Remove white label marked 1.  Remove white label marked 2.  Rub patch adhesive wings for 2 additional minutes.   While looking in a mirror, press and release button in center of patch.  A small green light will flash 3-4 times .  This will be your only indicator the monitor has been turned on.     Do not shower for the first 24 hours.  You may shower after the first 24 hours.   Press button if you feel a symptom. You will hear a small click.  Record Date, Time and Symptom in the Patient Log Book.   When you are ready to remove patch, follow instructions on last 2 pages of Patient Log Book.  Stick patch monitor onto last page of Patient Log Book.   Place Patient Log Book in [place] box.  Use locking tab on box and tape box closed securely.  The [employer] and [employer] has [name] on it.  Please place in mailbox as soon as possible.  Your physician should have your test results approximately 7 days after the monitor has been mailed back to [facility].   Call [facility] Customer Care at [phone] if you have questions regarding your ZIO XT patch monitor.  Call them immediately if you see an orange light blinking  on your monitor.   If your monitor falls off in less than 4 days contact our Monitor department at [phone].  If your monitor becomes loose or falls off after 4 days call [employer] at [phone] for suggestions on securing your monitor.    Follow-Up: At [facility], you and your health needs are our priority.  As part of our continuing mission to provide you with exceptional heart care, our providers are all part of one team.  This team includes your primary Cardiologist (physician) and Advanced Practice Providers or APPs (Physician Assistants and Nurse Practitioners) who all work together to provide you with the care  you need, when you need it.  Your next appointment:   1 month(s)  Provider:   Danelle Birmingham, MD    We recommend signing up for the patient portal called MyChart.  Sign up information is provided on this After Visit Summary.  MyChart is used to connect with patients for Virtual Visits (Telemedicine).  Patients are able to view lab/test results, encounter notes, upcoming appointments, etc.  Non-urgent messages can be sent to your provider as well.   To learn more about what you can do with MyChart, go to [email or web address].   Other Instructions Thank you for choosing [facility]!
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
## 2024-04-04 ENCOUNTER — Telehealth: Payer: Self-pay | Admitting: *Deleted

## 2024-04-04 ENCOUNTER — Encounter: Payer: Self-pay | Admitting: *Deleted

## 2024-04-11 DIAGNOSIS — I4819 Other persistent atrial fibrillation: Secondary | ICD-10-CM | POA: Diagnosis not present

## 2024-04-14 ENCOUNTER — Other Ambulatory Visit: Payer: Self-pay | Admitting: *Deleted

## 2024-04-14 ENCOUNTER — Encounter: Payer: Self-pay | Admitting: *Deleted

## 2024-04-14 NOTE — Patient Outreach (Signed)
 Complex Care Management   Visit Note  04/14/2024  Name:  Kenneth [name] MRN: [id number] DOB: November 16, 1950  Situation: Referral received for Complex Care Management related to Atrial Fibrillation I obtained verbal consent from Patient.  Visit completed with Kenneth [name] on the phone  Background:   Past Medical History:  Diagnosis Date   Anticoagulant long-term use    eliquis --- managed by cardiology   Arthritis    osteoporosis   Atrial fibrillation, unspecified type Kenneth [name]) 2018   cardiologist--- dr j. branch   Bladder neoplasm    Bradycardia    COPD (chronic obstructive pulmonary disease) (HCC)    pulled from [facility] report. Added by [facility] 03/06/24   Gallbladder polyp    followed by surgeon, dr r. cathy (lov note in epic 06-27-2021 stated surgical management after pt has completed bladder cancer treatment   GERD (gastroesophageal reflux disease)    Heart murmur    History of drug abuse in remission (HCC)    per pt in remission since 02-26-1999   History of hepatitis C 12/2020   followed by dr d. eartha flavors (aph/ [facility] and hepatology);  dx 03/ 2022, liver bx 01-12-2021 g2hepatitis, f2;  completed 12 wks treatment 04-14-2021 w/ epclusa, hcv undetectable 04-21-2021   Mitral valve regurgitation    followed by dr branch---  last echo in epic 02-10-2021  moderate MR without stenosis   Osteoporosis    Wears dentures    full upper and lower partial   Wears hearing aid in left ear     Assessment: Patient Reported Symptoms:  Cognitive Cognitive Status: Alert and oriented to person, place, and time, Normal speech and language skills, No symptoms reported Cognitive/Intellectual Conditions Management [RPT]: None reported or documented in medical history or problem list   Health Maintenance Behaviors: Annual physical exam Healing Pattern: Unsure Health Facilitated by: Rest  Neurological Neurological Review of Symptoms: Not assessed    HEENT HEENT Symptoms  Reported: Not assessed      Cardiovascular Cardiovascular Symptoms Reported: Other: Other Cardiovascular Symptoms: Reports only one episode of presyncope over the past month Does patient have uncontrolled Hypertension?: No Cardiovascular Management Strategies: Routine screening, Medication therapy, Coping strategies Cardiovascular Self-Management Outcome: 4 (good) Cardiovascular Comment: F/U scheduled with Dr Waddell on 05/06/24 to review results of ZIO Heart Monitor. Encouraged to reach out to provider with new or worsening symptoms.  Respiratory Respiratory Symptoms Reported: No symptoms reported Additional Respiratory Details: cough and chest congestion has resolved since discontinuing Breztri. Frequency of presyncopal episodes have not increased since discontinuing Breztri. Respiratory Management Strategies: Adequate rest Respiratory Self-Management Outcome: 4 (good)  Endocrine Endocrine Symptoms Reported: Not assessed    Gastrointestinal Gastrointestinal Symptoms Reported: Not assessed      Genitourinary Genitourinary Symptoms Reported: Not assessed    Integumentary Integumentary Symptoms Reported: Not assessed    Musculoskeletal Musculoskelatal Symptoms Reviewed: Not assessed        Psychosocial Psychosocial Symptoms Reported: Not assessed            02/04/2024   12:32 PM  Depression screen PHQ 2/9  Decreased Interest 0  Down, Depressed, Hopeless 0  PHQ - 2 Score 0    There were no vitals filed for this visit.  Medications Reviewed Today     Reviewed by Charlsie Josette SAILOR, RN (Registered Nurse) on 04/14/24 at 1703  Med List Status: <None>   Medication Order Taking? Sig Documenting Provider Last Dose Status Informant  acetaminophen  (TYLENOL ) 500 MG tablet [id number] Yes  Take 500-1,000 mg by mouth every 6 (six) hours as needed for moderate pain. [provider]  Active Self  alendronate (FOSAMAX) 70 MG tablet [id number] Yes Take 70 mg by mouth once a week. Takes  on Thursday's ;  Take with a full glass of water  on an empty stomach. [provider]  Active Self  apixaban  (ELIQUIS ) 5 MG TABS tablet [id number] Yes Take 1 tablet (5 mg total) by mouth 2 (two) times daily. Miriam Norris, NP  Active   Ascorbic Acid (VITAMIN C) 1000 MG tablet [id number] Yes Take 1,000 mg by mouth 2 (two) times daily. [provider]  Active Self  BREZTRI AEROSPHERE 160-9-4.8 MCG/ACT AERO inhaler [id number] No Inhale 1 puff into the lungs in the morning and at bedtime.  Patient not taking: Reported on 04/14/2024   [provider]  Active   calcium -vitamin D (OSCAL WITH D) 500-200 MG-UNIT tablet [id number] Yes Take 1 tablet by mouth 2 (two) times daily. [provider]  Active Self  CIPROFLOXACIN -DEXAMETHASONE  OT [id number] Yes Place 3 drops in ear(s) daily as needed (ear infection). [provider]  Active Self  digoxin  (LANOXIN ) 0.125 MG tablet [id number] Yes Take 1 tablet (0.125 mg total) by mouth daily. Miriam Norris, NP  Active   furosemide  (LASIX ) 40 MG tablet [id number] Yes TAKE 1 TABLET BY MOUTH DAILY - TAKE AN ADDITIONAL DOSE FOR LOWER EDEMA SWELLING OR WEIGHT GAIN Miriam Norris, NP  Active   loperamide (IMODIUM A-D) 2 MG tablet [id number] Yes Take 2 mg by mouth 4 (four) times daily as needed for diarrhea or loose stools. [provider]  Active Self  Magnesium  250 MG TABS [id number] Yes Take 250 mg by mouth daily. [provider]  Active Self  metoprolol  succinate (TOPROL -XL) 25 MG 24 hr tablet [id number] Yes Take 1 tablet (25 mg total) by mouth daily. Miriam Norris, NP  Active   Multiple Vitamins-Minerals (MULTIVITAMIN PO) [id number] Yes Take 1 tablet by mouth daily. [provider]  Active Self  omeprazole (PRILOSEC) 20 MG capsule [id number] Yes Take 20 mg by mouth daily. [provider]  Active Self  Polyethyl Glycol-Propyl Glycol (SYSTANE OP) [id number] Yes Place 1 drop into both eyes daily as needed  (dry eyes). [provider]  Active Self  potassium chloride  SA (KLOR-CON  M) 20 MEQ tablet [id number] Yes Take 1 tablet (20 mEq total) by mouth daily. Miriam Norris, NP  Active   pseudoephedrine (SUDAFED) 30 MG tablet [id number] Yes Take 30 mg by mouth daily as needed for congestion. [provider]  Active Self  sildenafil (REVATIO) 20 MG tablet [id number] Yes Take 20-60 mg by mouth daily as needed (erectile dysfunction). [provider]  Active Self  sodium chloride  flush (NS) 0.9 % injection 3 mL [id number]   Miriam Norris, NP  Active   tiZANidine (ZANAFLEX) 4 MG capsule [id number] Yes Take 4 mg by mouth 2 (two) times daily as needed for muscle spasms. [provider]  Active Self           Med Note SIMMIE JARVIS   Tue Apr 24, 2023 12:51 PM) PRN            Recommendation:   Specialty provider follow-up Dr Waddell on 05/06/24 Continue Current Plan of Care  Follow Up Plan:   Telephone follow up appointment date/time:  05/12/24 at 3:30  Josette Pellet, RN, BSN [place]  [facility] RN Care Manager Direct Dial: [phone]  Fax: [phone]

## 2024-04-14 NOTE — Patient Instructions (Signed)
 Visit Information  Thank you for taking time to visit with me today. Please don't hesitate to contact me if I can be of assistance to you before our next scheduled appointment.  Your next care management appointment is by telephone on 05/12/24 at 3:30  Please call the care guide team at [phone] if you need to cancel, schedule, or reschedule an appointment.   Please call the [facility]: [phone] call [phone] if you are experiencing a Mental Health or Behavioral Health Crisis or need someone to talk to.  Josette Pellet, RN, BSN [place]  [facility] RN Care Manager Direct Dial: [phone]  Fax: [phone]

## 2024-04-23 DIAGNOSIS — J449 Chronic obstructive pulmonary disease, unspecified: Secondary | ICD-10-CM | POA: Diagnosis not present

## 2024-05-06 ENCOUNTER — Ambulatory Visit: Attending: Internal Medicine | Admitting: Internal Medicine

## 2024-05-06 VITALS — BP 102/66 | HR 66 | Ht 72.0 in | Wt 202.0 lb

## 2024-05-06 DIAGNOSIS — I4821 Permanent atrial fibrillation: Secondary | ICD-10-CM

## 2024-05-06 MED ORDER — FUROSEMIDE 40 MG PO TABS: 60.0000 mg | ORAL_TABLET | Freq: Every day | ORAL | Status: DC
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
## 2024-05-06 NOTE — Patient Instructions (Addendum)
 Medication Instructions:  Your physician has recommended you make the following change in your medication:  Increase Lasix  to 60mg  daily.  Lab Work: None ordered.  You may go to any [facility] Location for your lab work:  [facility] - [address] ([facility] [place]) - [address] [phone] [address]  [place] - [address]   [place]  - [address]   [place] - [address] - [address] [employer]  - [address] [place] - [address] ([facility]   If you have labs (blood work) drawn today and your tests are completely normal, you will receive your results only by: [employer] (if you have MyChart)  If you have any lab test that is abnormal or we need to change your treatment, we will call you or send a MyChart message to review the results.  Testing/Procedures: None ordered.  Follow-Up: At [facility], you and your health needs are our priority.  As part of our continuing mission to provide you with exceptional heart care, we have created designated Provider Care Teams.  These Care Teams include your primary Cardiologist (physician) and Advanced Practice Providers (APPs -  Physician Assistants and Nurse Practitioners) who all work together to provide you with the care you need, when you need it.  Your next appointment:   1 year(s)  The format for your next appointment:   In Person  Provider:   Donnice Primus, MD or one of the following Advanced Practice Providers on your designated Care Team:   Charlies Arthur, [place] Ozell Jodie Passey, [place] Leotis Barrack, NP  Note: Remote monitoring is used to monitor your Pacemaker/ ICD from home. This monitoring reduces the number of office visits required to check your device to one time per year. It allows us  to keep an eye on the functioning of your device to ensure it is working properly.

## 2024-05-07 ENCOUNTER — Other Ambulatory Visit: Payer: Self-pay | Admitting: Nurse Practitioner

## 2024-05-08 DIAGNOSIS — J449 Chronic obstructive pulmonary disease, unspecified: Secondary | ICD-10-CM | POA: Diagnosis not present

## 2024-05-08 DIAGNOSIS — B182 Chronic viral hepatitis C: Secondary | ICD-10-CM | POA: Diagnosis not present

## 2024-05-08 DIAGNOSIS — I10 Essential (primary) hypertension: Secondary | ICD-10-CM | POA: Diagnosis not present

## 2024-05-08 DIAGNOSIS — I4891 Unspecified atrial fibrillation: Secondary | ICD-10-CM | POA: Diagnosis not present

## 2024-05-08 DIAGNOSIS — Z299 Encounter for prophylactic measures, unspecified: Secondary | ICD-10-CM | POA: Diagnosis not present

## 2024-05-08 DIAGNOSIS — Z0000 Encounter for general adult medical examination without abnormal findings: Secondary | ICD-10-CM | POA: Diagnosis not present

## 2024-05-10 DIAGNOSIS — I4819 Other persistent atrial fibrillation: Secondary | ICD-10-CM | POA: Diagnosis not present

## 2024-05-12 ENCOUNTER — Telehealth: Admitting: *Deleted

## 2024-05-12 ENCOUNTER — Other Ambulatory Visit: Payer: Self-pay | Admitting: *Deleted

## 2024-05-12 NOTE — Patient Outreach (Addendum)
 Complex Care Management   Visit Note  05/12/2024  Name:  Kenneth [name] MRN: [id number] DOB: 1951/03/17  Situation: Referral received for Complex Care Management related to Atrial Fibrillation I obtained verbal consent from Patient.  Visit completed with patient  on the phone  Background:   Past Medical History:  Diagnosis Date   Anticoagulant long-term use    eliquis --- managed by cardiology   Arthritis    osteoporosis   Atrial fibrillation, unspecified type [facility]) 2018   cardiologist--- dr j. branch   Bladder neoplasm    Bradycardia    COPD (chronic obstructive pulmonary disease) (HCC)    pulled from [facility] report. Added by [facility] 03/06/24   Gallbladder polyp    followed by surgeon, dr r. cathy (lov note in epic 06-27-2021 stated surgical management after pt has completed bladder cancer treatment   GERD (gastroesophageal reflux disease)    Heart murmur    History of drug abuse in remission (HCC)    per pt in remission since 02-26-1999   History of hepatitis C 12/2020   followed by dr d. eartha flavors (aph/ [facility] hepat[facility]);  dx 03/ 2022, liver bx 01-12-2021 g2hepatitis, f2;  completed 12 wks treatment 04-14-2021 w/ epclusa, hcv undetectable 04-21-2021   Mitral valve regurgitation    followed by dr branch---  last echo in epic 02-10-2021  moderate MR without stenosis   Osteoporosis    Wears dentures    full upper and lower partial   Wears hearing aid in left ear     Assessment: Patient Reported Symptoms:  Cognitive Cognitive Status: Alert and oriented to person, place, and time, Able to follow simple commands, Normal speech and language skills   Health Maintenance Behaviors: Annual physical exam  Neurological Neurological Review of Symptoms: No symptoms reported    HEENT HEENT Symptoms Reported: No symptoms reported HEENT Management Strategies: Routine screening, Medication therapy HEENT Comment: Chronic mastoditis-Reports Q4 month go for ear  cleaning right side    Cardiovascular Cardiovascular Symptoms Reported: Other: Other Cardiovascular Symptoms: Continues to report chronic episodes of presyncope-provider is aware Does patient have uncontrolled Hypertension?: No Cardiovascular Management Strategies: Routine screening, Medication therapy, Coping strategies Weight: 202 lb (91.6 kg) Cardiovascular Self-Management Outcome: 4 (good) Cardiovascular Comment: Confirmed recent visit with Dr. Waddell with no events or intervention based upon the ZIO ,onitoring. Patient to reach out to his provider with any new or worsening symptoms.  Respiratory Respiratory Symptoms Reported: No symptoms reported    Endocrine Endocrine Symptoms Reported: No symptoms reported Is patient diabetic?: No    Gastrointestinal Gastrointestinal Symptoms Reported: No symptoms reported      Genitourinary Genitourinary Symptoms Reported: No symptoms reported    Integumentary Integumentary Symptoms Reported: No symptoms reported    Musculoskeletal Musculoskelatal Symptoms Reviewed: No symptoms reported        Psychosocial Psychosocial Symptoms Reported: No symptoms reported     Do you feel physically threatened by others?: No      05/12/2024    3:46 PM  Depression screen PHQ 2/9  Decreased Interest 0  Down, Depressed, Hopeless 0  PHQ - 2 Score 0    There were no vitals filed for this visit.  Medications Reviewed Today     Reviewed by Alvia Olam BIRCH, RN (Registered Nurse) on 05/12/24 at 1550  Med List Status: <None>   Medication Order Taking? Sig Documenting Provider Last Dose Status Informant  acetaminophen  (TYLENOL ) 500 MG tablet [id number] Yes Take 500-1,000 mg by mouth every  6 (six) hours as needed for moderate pain. [provider]  Active Self  alendronate (FOSAMAX) 70 MG tablet [id number] Yes Take 70 mg by mouth once a week. Takes on Thursday's ;  Take with a full glass of water  on an empty stomach. [provider]  Active  Self  apixaban  (ELIQUIS ) 5 MG TABS tablet [id number] Yes Take 1 tablet (5 mg total) by mouth 2 (two) times daily. Miriam Norris, NP  Active   Ascorbic Acid (VITAMIN C) 1000 MG tablet [id number] Yes Take 1,000 mg by mouth 2 (two) times daily. [provider]  Active Self  BREZTRI AEROSPHERE 160-9-4.8 MCG/ACT AERO inhaler [id number]  Inhale 1 puff into the lungs in the morning and at bedtime.  Patient not taking: Reported on 05/12/2024   [provider]  Active   calcium -vitamin D (OSCAL WITH D) 500-200 MG-UNIT tablet [id number] Yes Take 1 tablet by mouth 2 (two) times daily. [provider]  Active Self  CIPROFLOXACIN -DEXAMETHASONE  OT [id number] Yes Place 3 drops in ear(s) daily as needed (ear infection). [provider]  Active Self  digoxin  (LANOXIN ) 0.125 MG tablet [id number] Yes Take 1 tablet (0.125 mg total) by mouth daily. Miriam Norris, NP  Active   furosemide  (LASIX ) 40 MG tablet [id number] Yes Take 1.5 tablets (60 mg total) by mouth daily. Waddell Danelle ORN, MD  Active   loperamide (IMODIUM A-D) 2 MG tablet [id number] Yes Take 2 mg by mouth 4 (four) times daily as needed for diarrhea or loose stools. [provider]  Active Self  Magnesium  250 MG TABS [id number] Yes Take 250 mg by mouth daily. [provider]  Active Self  metoprolol  succinate (TOPROL -XL) 25 MG 24 hr tablet [id number] Yes Take 1 tablet (25 mg total) by mouth daily. Miriam Norris, NP  Active   Multiple Vitamins-Minerals (MULTIVITAMIN PO) [id number] Yes Take 1 tablet by mouth daily. [provider]  Active Self  omeprazole (PRILOSEC) 20 MG capsule [id number] Yes Take 20 mg by mouth daily. [provider]  Active Self  Polyethyl Glycol-Propyl Glycol (SYSTANE OP) [id number] Yes Place 1 drop into both eyes daily as needed (dry eyes). [provider]  Active Self  potassium chloride  SA (KLOR-CON  M) 20 MEQ tablet [id number] Yes Take 1 tablet (20 mEq total) by mouth  daily. Miriam Norris, NP  Active   pseudoephedrine (SUDAFED) 30 MG tablet [id number] Yes Take 30 mg by mouth daily as needed for congestion. [provider]  Active Self  sildenafil (REVATIO) 20 MG tablet [id number] Yes Take 20-60 mg by mouth daily as needed (erectile dysfunction). [provider]  Active Self  sodium chloride  flush (NS) 0.9 % injection 3 mL [id number]   Miriam Norris, NP  Active   tiZANidine (ZANAFLEX) 4 MG capsule [id number] Yes Take 4 mg by mouth 2 (two) times daily as needed for muscle spasms. [provider]  Active Self           Med Note SIMMIE JARVIS   Tue Apr 24, 2023 12:51 PM) PRN            Recommendation:   Continue Current Plan of Care  Follow Up Plan:   Telephone follow up appointment date/time:  06/12/2024 @ 2:30 PM   Olam Ku, RN, BSN [place]  [facility], [facility] RN Care Manager Direct Dial: [phone]  Fax: [phone]

## 2024-05-12 NOTE — Patient Instructions (Signed)
 Visit Information  Thank you for taking time to visit with me today. Please don't hesitate to contact me if I can be of assistance to you before our next scheduled appointment.  Your next care management appointment is by telephone on 06/12/2024 at 2:30 PM  Please call the care guide team at [phone] if you need to cancel, schedule, or reschedule an appointment.   Please call the Suicide and Crisis Lifeline: 988 call the [place]  National Suicide Prevention Lifeline: 450-755-7122 or TTY: 414-466-9553 TTY 414-015-8043) to talk to a trained counselor call 1-800-273-TALK (toll free, 24 hour hotline) if you are experiencing a Mental Health or Behavioral Health Crisis or need someone to talk to.   Olam Ku, RN, BSN [place]  [facility], [facility] RN Care Manager Direct Dial: [phone]  Fax: [phone]

## 2024-05-22 ENCOUNTER — Other Ambulatory Visit: Payer: Self-pay

## 2024-05-22 MED ORDER — FUROSEMIDE 40 MG PO TABS: 60.0000 mg | ORAL_TABLET | Freq: Every day | ORAL | 2 refills | Status: DC

## 2024-05-24 DIAGNOSIS — J449 Chronic obstructive pulmonary disease, unspecified: Secondary | ICD-10-CM | POA: Diagnosis not present

## 2024-05-25 ENCOUNTER — Ambulatory Visit: Payer: Self-pay | Admitting: Internal Medicine

## 2024-06-05 DIAGNOSIS — I10 Essential (primary) hypertension: Secondary | ICD-10-CM | POA: Diagnosis not present

## 2024-06-05 DIAGNOSIS — F419 Anxiety disorder, unspecified: Secondary | ICD-10-CM | POA: Diagnosis not present

## 2024-06-05 DIAGNOSIS — R55 Syncope and collapse: Secondary | ICD-10-CM | POA: Diagnosis not present

## 2024-06-05 DIAGNOSIS — Z299 Encounter for prophylactic measures, unspecified: Secondary | ICD-10-CM | POA: Diagnosis not present

## 2024-06-05 DIAGNOSIS — I4891 Unspecified atrial fibrillation: Secondary | ICD-10-CM | POA: Diagnosis not present

## 2024-06-12 ENCOUNTER — Other Ambulatory Visit: Payer: Self-pay | Admitting: *Deleted

## 2024-06-12 NOTE — Patient Instructions (Signed)
 Visit Information  Thank you for taking time to visit with me today. Please don't hesitate to contact me if I can be of assistance to you before our next scheduled appointment.  Your next care management appointment is by telephone on 07/11/2024 at 2:30 pm  Please call the care guide team at [phone] if you need to cancel, schedule, or reschedule an appointment.   Please call the Suicide and Crisis Lifeline: 988 call the [place]  National Suicide Prevention Lifeline: 7271958913 or TTY: 415-522-7839 TTY (610)331-4444) to talk to a trained counselor call 1-800-273-TALK (toll free, 24 hour hotline) if you are experiencing a Mental Health or Behavioral Health Crisis or need someone to talk to.   Olam Ku, RN, BSN [place]  [facility], [facility] RN Care Manager Direct Dial: [phone]  Fax: [phone]

## 2024-06-12 NOTE — Patient Outreach (Signed)
 Complex Care Management   Visit Note  06/12/2024  Name:  Kenneth [name] MRN: [id number] DOB: 27-Jun-1951  Situation: Referral received for Complex Care Management related to Atrial Fibrillation I obtained verbal consent from Patient.  Visit completed with Patient  on the phone  Background:   Past Medical History:  Diagnosis Date   Anticoagulant long-term use    eliquis --- managed by cardiology   Arthritis    osteoporosis   Atrial fibrillation, unspecified type [facility]) 2018   cardiologist--- dr j. branch   Bladder neoplasm    Bradycardia    COPD (chronic obstructive pulmonary disease) (HCC)    pulled from [facility] report. Added by [facility] 03/06/24   Gallbladder polyp    followed by surgeon, dr r. cathy (lov note in epic 06-27-2021 stated surgical management after pt has completed bladder cancer treatment   GERD (gastroesophageal reflux disease)    Heart murmur    History of drug abuse in remission (HCC)    per pt in remission since 02-26-1999   History of hepatitis C 12/2020   followed by dr d. eartha flavors (aph/ [facility] and hepatology);  dx 03/ 2022, liver bx 01-12-2021 g2hepatitis, f2;  completed 12 wks treatment 04-14-2021 w/ epclusa, hcv undetectable 04-21-2021   Mitral valve regurgitation    followed by dr branch---  last echo in epic 02-10-2021  moderate MR without stenosis   Osteoporosis    Wears dentures    full upper and lower partial   Wears hearing aid in left ear     Assessment: Patient Reported Symptoms:  Cognitive Cognitive Status: Alert and oriented to person, place, and time, Able to follow simple commands, Normal speech and language skills Cognitive/Intellectual Conditions Management [RPT]: None reported or documented in medical history or problem list   Health Maintenance Behaviors: Annual physical exam  Neurological Neurological Review of Symptoms: No symptoms reported    HEENT HEENT Symptoms Reported: No symptoms reported HEENT  Management Strategies: Routine screening, Medication therapy    Cardiovascular Cardiovascular Symptoms Reported: Other: Other Cardiovascular Symptoms: Continues to report chronic presyncope MD aware. Does patient have uncontrolled Hypertension?: No Cardiovascular Management Strategies: Routine screening, Medication therapy, Coping strategies Weight: 200 lb (90.7 kg) Cardiovascular Self-Management Outcome: 4 (good) Cardiovascular Comment: Completed regimen wearing the monitoring with discussion on all results. No further interventions as patient aware on what to do if acute episodes occur related to his ongoing atrial fibrillation.  Respiratory Respiratory Symptoms Reported: No symptoms reported Respiratory Management Strategies: Adequate rest  Endocrine Endocrine Symptoms Reported: No symptoms reported Is patient diabetic?: No    Gastrointestinal Gastrointestinal Symptoms Reported: No symptoms reported      Genitourinary Genitourinary Symptoms Reported: No symptoms reported Genitourinary Management Strategies: Coping strategies Genitourinary Self-Management Outcome: 4 (good) Genitourinary Comment: Continues to follow up Dr. Nieves for Urologist (bladder cancer history).  Integumentary Integumentary Symptoms Reported: No symptoms reported Skin Management Strategies: Adequate rest, Routine screening  Musculoskeletal Musculoskelatal Symptoms Reviewed: No symptoms reported Musculoskeletal Management Strategies: Adequate rest      Psychosocial Psychosocial Symptoms Reported: No symptoms reported          06/12/2024    PHQ2-9 Depression Screening   Little interest or pleasure in doing things Not at all  Feeling down, depressed, or hopeless Not at all  PHQ-2 - Total Score 0  Trouble falling or staying asleep, or sleeping too much    Feeling tired or having little energy    Poor appetite or overeating  Feeling bad about yourself - or that you are a failure or have let yourself or  your family down    Trouble concentrating on things, such as reading the newspaper or watching television    Moving or speaking so slowly that other people could have noticed.  Or the opposite - being so fidgety or restless that you have been moving around a lot more than usual    Thoughts that you would be better off dead, or hurting yourself in some way    PHQ2-9 Total Score    If you checked off any problems, how difficult have these problems made it for you to do your work, take care of things at home, or get along with other people    Depression Interventions/Treatment      There were no vitals filed for this visit.  Medications Reviewed Today     Reviewed by Alvia Olam BIRCH, RN (Registered Nurse) on 06/12/24 at 1451  Med List Status: <None>   Medication Order Taking? Sig Documenting Provider Last Dose Status Informant  acetaminophen  (TYLENOL ) 500 MG tablet [id number] Yes Take 500-1,000 mg by mouth every 6 (six) hours as needed for moderate pain. [provider]  Active Self  alendronate (FOSAMAX) 70 MG tablet [id number] Yes Take 70 mg by mouth once a week. Takes on Thursday's ;  Take with a full glass of water  on an empty stomach. [provider]  Active Self  apixaban  (ELIQUIS ) 5 MG TABS tablet [id number] Yes Take 1 tablet (5 mg total) by mouth 2 (two) times daily. Miriam Norris, NP  Active   Ascorbic Acid (VITAMIN C) 1000 MG tablet [id number] Yes Take 1,000 mg by mouth 2 (two) times daily. [provider]  Active Self  BREZTRI AEROSPHERE 160-9-4.8 MCG/ACT AERO inhaler [id number]  Inhale 1 puff into the lungs in the morning and at bedtime.  Patient not taking: Reported on 05/12/2024   [provider]  Active   calcium -vitamin D (OSCAL WITH D) 500-200 MG-UNIT tablet [id number] Yes Take 1 tablet by mouth 2 (two) times daily. [provider]  Active Self  CIPROFLOXACIN -DEXAMETHASONE  OT [id number] Yes Place 3 drops in ear(s) daily as needed (ear  infection). [provider]  Active Self  digoxin  (LANOXIN ) 0.125 MG tablet [id number] Yes Take 1 tablet (0.125 mg total) by mouth daily. Miriam Norris, NP  Active   furosemide  (LASIX ) 40 MG tablet [id number] Yes Take 1.5 tablets (60 mg total) by mouth daily. Miriam Norris, NP  Active   loperamide (IMODIUM A-D) 2 MG tablet [id number] Yes Take 2 mg by mouth 4 (four) times daily as needed for diarrhea or loose stools. [provider]  Active Self  Magnesium  250 MG TABS [id number] Yes Take 250 mg by mouth daily. [provider]  Active Self  metoprolol  succinate (TOPROL -XL) 25 MG 24 hr tablet [id number] Yes Take 1 tablet (25 mg total) by mouth daily. Miriam Norris, NP  Active   Multiple Vitamins-Minerals (MULTIVITAMIN PO) [id number] Yes Take 1 tablet by mouth daily. [provider]  Active Self  omeprazole (PRILOSEC) 20 MG capsule [id number] Yes Take 20 mg by mouth daily. [provider]  Active Self  Polyethyl Glycol-Propyl Glycol (SYSTANE OP) [id number] Yes Place 1 drop into both eyes daily as needed (dry eyes). [provider]  Active Self  potassium chloride  SA (KLOR-CON  M) 20 MEQ tablet [id number] Yes Take 1 tablet (20 mEq total) by mouth daily. Miriam Norris, NP  Active   pseudoephedrine (SUDAFED) 30  MG tablet [id number] Yes Take 30 mg by mouth daily as needed for congestion. [provider]  Active Self  sildenafil (REVATIO) 20 MG tablet [id number] Yes Take 20-60 mg by mouth daily as needed (erectile dysfunction). [provider]  Active Self  sodium chloride  flush (NS) 0.9 % injection 3 mL [id number]   Miriam Norris, NP  Active   tiZANidine (ZANAFLEX) 4 MG capsule [id number] Yes Take 4 mg by mouth 2 (two) times daily as needed for muscle spasms. [provider]  Active Self           Med Note SIMMIE JARVIS   Tue Apr 24, 2023 12:51 PM) PRN            Recommendation:   PCP Follow-up Continue Current Plan of  Care  Follow Up Plan:   Telephone follow up appointment date/time:  07/11/2024 @ 2:30 pm   Olam Ku, RN, BSN [place]  [facility], [facility] RN Care Manager Direct Dial: [phone]  Fax: [phone]

## 2024-06-13 ENCOUNTER — Other Ambulatory Visit: Payer: Self-pay | Admitting: Nurse Practitioner

## 2024-06-16 DIAGNOSIS — M79674 Pain in right toe(s): Secondary | ICD-10-CM | POA: Diagnosis not present

## 2024-06-16 DIAGNOSIS — I10 Essential (primary) hypertension: Secondary | ICD-10-CM | POA: Diagnosis not present

## 2024-06-16 DIAGNOSIS — R52 Pain, unspecified: Secondary | ICD-10-CM | POA: Diagnosis not present

## 2024-06-16 DIAGNOSIS — Z299 Encounter for prophylactic measures, unspecified: Secondary | ICD-10-CM | POA: Diagnosis not present

## 2024-06-24 DIAGNOSIS — J449 Chronic obstructive pulmonary disease, unspecified: Secondary | ICD-10-CM | POA: Diagnosis not present

## 2024-07-11 ENCOUNTER — Other Ambulatory Visit: Payer: Self-pay | Admitting: *Deleted

## 2024-07-11 NOTE — Patient Instructions (Signed)
 Visit Information  Thank you for taking time to visit with me today. Please don't hesitate to contact me if I can be of assistance to you before our next scheduled appointment.  Your next care management appointment is by telephone on 08/13/2024 at 2:00 pm  Please call the care guide team at [phone] if you need to cancel, schedule, or reschedule an appointment.   Please call the Suicide and Crisis Lifeline: 988 call the [place]  National Suicide Prevention Lifeline: 825 757 6720 or TTY: 682-624-4754 TTY (234)618-7357) to talk to a trained counselor call 1-800-273-TALK (toll free, 24 hour hotline) if you are experiencing a Mental Health or Behavioral Health Crisis or need someone to talk to.   Olam Ku, RN, BSN [place]  [facility], [facility] RN Care Manager Direct Dial: [phone]  Fax: [phone]

## 2024-07-11 NOTE — Patient Outreach (Signed)
 Complex Care Management   Visit Note  07/11/2024  Name:  ARLIND KLINGERMAN MRN: [id number] DOB: [birth year]  Situation: Referral received for Complex Care Management related to Atrial Fibrillation I obtained verbal consent from Patient.  Visit completed with Patient  on the phone  Background:   Past Medical History:  Diagnosis Date   Anticoagulant long-term use    eliquis --- managed by cardiology   Arthritis    osteoporosis   Atrial fibrillation, unspecified type [facility]) 2018   cardiologist--- dr j. branch   Bladder neoplasm    Bradycardia    COPD (chronic obstructive pulmonary disease) (HCC)    pulled from [facility] report. Added by [facility] Internal Medicine 03/06/24   Gallbladder polyp    followed by surgeon, dr r. cathy (lov note in epic 06-27-2021 stated surgical management after pt has completed bladder cancer treatment   GERD (gastroesophageal reflux disease)    Heart murmur    History of drug abuse in remission (HCC)    per pt in remission since 02-26-1999   History of hepatitis C 12/2020   followed by dr d. eartha flavors (aph/ [facility] and hepatology);  dx 03/ 2022, liver bx 01-12-2021 g2hepatitis, f2;  completed 12 wks treatment 04-14-2021 w/ epclusa, hcv undetectable 04-21-2021   Mitral valve regurgitation    followed by dr branch---  last echo in epic 02-10-2021  moderate MR without stenosis   Osteoporosis    Wears dentures    full upper and lower partial   Wears hearing aid in left ear     Assessment: Patient Reported Symptoms:  Cognitive Cognitive Status: Able to follow simple commands, Alert and oriented to person, place, and time, Normal speech and language skills Cognitive/Intellectual Conditions Management [RPT]: None reported or documented in medical history or problem list   Health Maintenance Behaviors: Annual physical exam Health Facilitated by: Rest  Neurological Neurological Review of Symptoms: No symptoms reported Neurological Management Strategies:  Routine screening  HEENT HEENT Symptoms Reported: Ear discharge HEENT Management Strategies: Routine screening, Medication therapy HEENT Comment: Intermittent right ear with diagnosis of chronic mastoiditis with cleaning q4 months by ENT (Dr. Karis) provider. Good tolerance.    Cardiovascular Cardiovascular Symptoms Reported: Palpitations (Hx of Afib and presyncope pending f/u appointment on 07/28/2024) Does patient have uncontrolled Hypertension?: No (last BP 112/73 this morning denies any symptoms) Cardiovascular Management Strategies: Routine screening, Adequate rest, Medication therapy Weight: 200 lb (90.7 kg) Cardiovascular Self-Management Outcome: 4 (good)  Respiratory Respiratory Symptoms Reported: No symptoms reported Respiratory Management Strategies: Adequate rest, Routine screening, Medication therapy, Coping strategies Respiratory Self-Management Outcome: 4 (good)  Endocrine Endocrine Symptoms Reported: No symptoms reported Is patient diabetic?: No    Gastrointestinal Gastrointestinal Symptoms Reported: No symptoms reported      Genitourinary Genitourinary Symptoms Reported: No symptoms reported Genitourinary Management Strategies: Coping strategies  Integumentary Integumentary Symptoms Reported: No symptoms reported Skin Management Strategies: Adequate rest, Routine screening  Musculoskeletal Musculoskelatal Symptoms Reviewed: No symptoms reported Musculoskeletal Management Strategies: Adequate rest Falls in the past year?: No Number of falls in past year: 1 or less Was there an injury with Fall?: No Fall Risk Category Calculator: 0 Patient Fall Risk Level: Low Fall Risk Patient at Risk for Falls Due to: No Fall Risks  Psychosocial Psychosocial Symptoms Reported: No symptoms reported Behavioral Management Strategies: Support system, Abstinence from substances, Coping strategies Behavioral Health Comment: Continue to attend the AA meeting 1-2 times daily and recently had  an outing to the beach with his AA members [place]  Change/Loss/Stressor/Fears (CP): Denies Techniques to Cope with Loss/Stress/Change: Support group Do you feel physically threatened by others?: No    07/11/2024    PHQ2-9 Depression Screening   Little interest or pleasure in doing things Not at all  Feeling down, depressed, or hopeless Not at all  PHQ-2 - Total Score 0  Trouble falling or staying asleep, or sleeping too much    Feeling tired or having little energy    Poor appetite or overeating     Feeling bad about yourself - or that you are a failure or have let yourself or your family down    Trouble concentrating on things, such as reading the newspaper or watching television    Moving or speaking so slowly that other people could have noticed.  Or the opposite - being so fidgety or restless that you have been moving around a lot more than usual    Thoughts that you would be better off dead, or hurting yourself in some way    PHQ2-9 Total Score    If you checked off any problems, how difficult have these problems made it for you to do your work, take care of things at home, or get along with other people    Depression Interventions/Treatment      Vitals:   07/11/24 1439  BP: 112/73  Pulse: 89    Medications Reviewed Today     Reviewed by Alvia Olam BIRCH, RN (Registered Nurse) on 07/11/24 at 1430  Med List Status: <None>   Medication Order Taking? Sig Documenting Provider Last Dose Status Informant  acetaminophen  (TYLENOL ) 500 MG tablet [id number] Yes Take 500-1,000 mg by mouth every 6 (six) hours as needed for moderate pain. [provider]  Active Self  alendronate (FOSAMAX) 70 MG tablet [id number] Yes Take 70 mg by mouth once a week. Takes on Thursday's ;  Take with a full glass of water  on an empty stomach. [provider]  Active Self  apixaban  (ELIQUIS ) 5 MG TABS tablet [id number] Yes Take 1 tablet (5 mg total) by mouth 2 (two) times daily. Miriam Norris,  NP  Active   Ascorbic Acid (VITAMIN C) 1000 MG tablet [id number] Yes Take 1,000 mg by mouth 2 (two) times daily. [provider]  Active Self  BREZTRI AEROSPHERE 160-9-4.8 MCG/ACT AERO inhaler [id number]  Inhale 1 puff into the lungs in the morning and at bedtime.  Patient not taking: Reported on 05/12/2024   [provider]  Active   calcium -vitamin D (OSCAL WITH D) 500-200 MG-UNIT tablet [id number] Yes Take 1 tablet by mouth 2 (two) times daily. [provider]  Active Self  CIPROFLOXACIN -DEXAMETHASONE  OT [id number] Yes Place 3 drops in ear(s) daily as needed (ear infection). [provider]  Active Self  digoxin  (LANOXIN ) 0.125 MG tablet [id number] Yes Take 1 tablet (0.125 mg total) by mouth daily. Miriam Norris, NP  Active   furosemide  (LASIX ) 40 MG tablet [id number] Yes Take 1.5 tablets (60 mg total) by mouth daily. Miriam Norris, NP  Active   loperamide (IMODIUM A-D) 2 MG tablet [id number] Yes Take 2 mg by mouth 4 (four) times daily as needed for diarrhea or loose stools. [provider]  Active Self  Magnesium  250 MG TABS [id number] Yes Take 250 mg by mouth daily. [provider]  Active Self  metoprolol  succinate (TOPROL -XL) 25 MG 24 hr tablet [id number] Yes Take 1 tablet (25 mg total) by mouth daily. Miriam Norris, NP  Active   Multiple Vitamins-Minerals (MULTIVITAMIN PO)  [id number] Yes Take 1 tablet by mouth daily. [provider]  Active Self  omeprazole (PRILOSEC) 20 MG capsule [id number] Yes Take 20 mg by mouth daily. [provider]  Active Self  Polyethyl Glycol-Propyl Glycol (SYSTANE OP) [id number] Yes Place 1 drop into both eyes daily as needed (dry eyes). [provider]  Active Self  potassium chloride  SA (KLOR-CON  M) 20 MEQ tablet [id number] Yes Take 1 tablet by mouth once daily Branch, Jonathan F, MD  Active   pseudoephedrine (SUDAFED) 30 MG tablet [id number] Yes Take 30 mg by mouth daily as needed for  congestion. [provider]  Active Self  sildenafil (REVATIO) 20 MG tablet [id number] Yes Take 20-60 mg by mouth daily as needed (erectile dysfunction). [provider]  Active Self  sodium chloride  flush (NS) 0.9 % injection 3 mL [id number]   Miriam Norris, NP  Active   tiZANidine (ZANAFLEX) 4 MG capsule [id number] Yes Take 4 mg by mouth 2 (two) times daily as needed for muscle spasms. [provider]  Active Self           Med Note SIMMIE JARVIS   Tue Apr 24, 2023 12:51 PM) PRN            Recommendation:   PCP Follow-up Acute PCP follow-up 09/01/2024. Continue Current Plan of Care  Follow Up Plan:   Telephone follow up appointment date/time:  08/13/2024 @ 2:00 pm   Olam Ku, RN, BSN [place]  [facility], [facility] RN Care Manager Direct Dial: [phone]  Fax: [phone]

## 2024-07-23 ENCOUNTER — Encounter (INDEPENDENT_AMBULATORY_CARE_PROVIDER_SITE_OTHER): Payer: Self-pay | Admitting: Gastroenterology

## 2024-07-24 ENCOUNTER — Ambulatory Visit (INDEPENDENT_AMBULATORY_CARE_PROVIDER_SITE_OTHER): Admitting: Otolaryngology

## 2024-07-24 DIAGNOSIS — J449 Chronic obstructive pulmonary disease, unspecified: Secondary | ICD-10-CM | POA: Diagnosis not present

## 2024-07-28 ENCOUNTER — Encounter: Payer: Self-pay | Admitting: Cardiology

## 2024-07-28 ENCOUNTER — Ambulatory Visit: Attending: Cardiology | Admitting: Cardiology

## 2024-07-28 MED ORDER — FUROSEMIDE 40 MG PO TABS: ORAL_TABLET | ORAL | Status: DC

## 2024-07-28 NOTE — Progress Notes (Unsigned)
 Clinical Summary Mr. Vonbehren is a 73 y.o.male seen today for follow up of the following medical problems.    1. Afib 09/2021 monitor: 100% afib, Patient was in afib throughout the study, range 64-213 bpm, average HR 110 -prevoiusly off elqiuis due to hematuria and pancytopenia related to bladder cancer and chemo. Most recently has been on anticoag and tolearing.   - very severe left atrial enlargement, LAVI 63. Had not been able to be on eliquis  ppveriously Have not pursued trying to reestablish SR.    - seen 01/12/22 by Dr Hobart - low bps 80s/60s, presyncopal - toprol  lowered to 100mg  daily, losartan  held, lasix  decreased to 40mg  daily - bp's improved today 110/64 - remains on eliquis , no recent hematuria.      - seen by EP, started on digoxin  - if failed consideration for av nodal ablation and ppm - has done very well since adding digoxin  to his beta blocker  04/2024 monitor: afib rates 45 to 127, avg 73. 8% PVCs. No significant pauses.  - compliant with meds - can have some palpitations at times    2. Dizziness with standing - when standing can at times get light headed, dizzy. - working to stay well hydrated -  2 bottles of water , cranberry,apple juice, orange juice. MTN dew, coke zero     2. Chronic systolic/diastlic HF.  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  - has compression stockings.     3.Bladder cancer - followed by oncology and urology - undergoing chemo and radiation, along with urology sTURBT  Oct 14 2021 onc note he continues to receive definitive therapy with radiation and weekly cisplatin .  Chemotherapy was withheld on October 06, 2021 due to cytopenia   - from note has compelted  current treatment course, now under observation     4. Lung cancer screening - atherosclerosis and coronary atherosclerosis noted       AAA screen Male over 86 with tobacco history. 09/2011 abdominal US  without aneusyrm.  Past Medical History:  Diagnosis Date   Anticoagulant long-term use    eliquis --- managed by cardiology   Arthritis    osteoporosis   Atrial fibrillation, unspecified type [facility]) 2018   cardiologist--- dr j. Whitten Andreoni   Bladder neoplasm    Bradycardia    COPD (chronic obstructive pulmonary disease) (HCC)    pulled from [facility] report. Added by [facility] 03/06/24   Gallbladder polyp    followed by surgeon, dr r. cathy (lov note in epic 06-27-2021 stated surgical management after pt has completed bladder cancer treatment   GERD (gastroesophageal reflux disease)    Heart murmur    History of drug abuse in remission (HCC)    per pt in remission since 02-26-1999   History of hepatitis C 12/2020   followed by dr d. eartha flavors (aph/ [facility] and hepatology);  dx 03/ 2022, liver bx 01-12-2021 g2hepatitis, f2;  completed 12 wks treatment 04-14-2021 w/ epclusa, hcv undetectable 04-21-2021   Mitral valve regurgitation    followed by dr Miley Lindon---  last echo in epic 02-10-2021  moderate MR without stenosis   Osteoporosis    Wears dentures    full upper and  lower partial   Wears hearing aid in left ear      No Known Allergies   Current Outpatient Medications  Medication Sig Dispense Refill   acetaminophen  (TYLENOL ) 500 MG tablet Take 500-1,000 mg by mouth every 6 (six) hours as needed for moderate pain.     alendronate (FOSAMAX) 70 MG tablet Take 70 mg by mouth once a week. Takes on Thursday's ;  Take with a full glass of water  on an empty stomach.     apixaban  (ELIQUIS ) 5 MG TABS tablet Take 1 tablet (5 mg total) by mouth 2 (two) times daily. 180 tablet 1   Ascorbic Acid (VITAMIN C) 1000 MG tablet Take 1,000 mg by mouth 2 (two) times daily.      BREZTRI AEROSPHERE 160-9-4.8 MCG/ACT AERO inhaler Inhale 1 puff into the lungs in the morning and at bedtime. (Patient not taking: Reported on 05/12/2024)     calcium -vitamin D (OSCAL WITH D) 500-200 MG-UNIT tablet Take 1 tablet by mouth 2 (two) times daily.     CIPROFLOXACIN -DEXAMETHASONE  OT Place 3 drops in ear(s) daily as needed (ear infection).     digoxin  (LANOXIN ) 0.125 MG tablet Take 1 tablet (0.125 mg total) by mouth daily. 90 tablet 1   furosemide  (LASIX ) 40 MG tablet Take 1.5 tablets (60 mg total) by mouth daily. 90 tablet 2   loperamide (IMODIUM A-D) 2 MG tablet Take 2 mg by mouth 4 (four) times daily as needed for diarrhea or loose stools.     Magnesium  250 MG TABS Take 250 mg by mouth daily.     metoprolol  succinate (TOPROL -XL) 25 MG 24 hr tablet Take 1 tablet (25 mg total) by mouth daily. 90 tablet 1   Multiple Vitamins-Minerals (MULTIVITAMIN PO) Take 1 tablet by mouth daily.     omeprazole (PRILOSEC) 20 MG capsule Take 20 mg by mouth daily.     Polyethyl Glycol-Propyl Glycol (SYSTANE OP) Place 1 drop into both eyes daily as needed (dry eyes).     potassium chloride  SA (KLOR-CON  M) 20 MEQ tablet Take 1 tablet by mouth once daily 90 tablet 1   pseudoephedrine (SUDAFED) 30 MG tablet Take 30 mg by mouth daily as needed for congestion.     sildenafil (REVATIO) 20 MG tablet Take 20-60 mg by mouth daily as needed (erectile dysfunction).     tiZANidine (ZANAFLEX) 4 MG capsule Take 4 mg by mouth 2 (two) times daily as needed for muscle spasms.     Current Facility-Administered Medications  Medication Dose Route Frequency Provider Last Rate Last Admin   sodium chloride  flush (NS) 0.9 % injection 3 mL  3 mL Intravenous Q12H Miriam Norris, NP         Past Surgical History:  Procedure Laterality Date   APPENDECTOMY     age 44   5 LIFT AND BLEPHAROPLASTY Bilateral    APPROX. 2017   CATARACT EXTRACTION W/ INTRAOCULAR LENS IMPLANT Bilateral 2018   HEMORROIDECTOMY     2010 @[facility]  and  2018 @ [facility]   HYDROCELE EXCISION Right 08/13/2017   Procedure: RIGHT HYDROCELECTOMY;  Surgeon: Sherrilee Belvie CROME, MD;  Location: [facility];  Service: Urology;  Laterality: Right;   HYDROCELE EXCISION Right 11/12/2017   Procedure: RIGHT HYDROCELECTOMY;  Surgeon: Sherrilee Belvie CROME, MD;  Location: [facility];  Service: Urology;  Laterality: Right;   INGUINAL HERNIA REPAIR Bilateral    APPROX.   2010;   WITH UMBILICAL HERNIA AND EPIGRASTIC HERNIA REPAIRS   INGUINAL HERNIA REPAIR  09/11/2011   Procedure: HERNIA REPAIR INGUINAL ADULT;  Surgeon: Elsie GORMAN Holland;  Location: [facility];  Service: General;  Laterality: Right;  Recurrent Right Inguinal Hernia Repair   MANDIBLE RECONSTRUCTION     x2  last one 1980;   from [facility]  ( has retained hardware)   MIDDLE EAR SURGERY Right 02/19/2015   by Dr Karis ;  tympanomastoidectomy   RIGHT/LEFT HEART CATH AND CORONARY ANGIOGRAPHY N/A 01/30/2023   Procedure: RIGHT/LEFT HEART CATH AND CORONARY ANGIOGRAPHY;  Surgeon: Anner Alm ORN, MD;  Location: [facility];  Service: Cardiovascular;  Laterality: N/A;   ROTATOR CUFF REPAIR Left 03/31/2016   TRANSURETHRAL RESECTION OF BLADDER TUMOR N/A 06/17/2021   Procedure: TRANSURETHRAL RESECTION OF BLADDER TUMOR (TURBT) WITH POST OPERATIVE INSTILLATION OF GEMCITABINE ;  Surgeon: Nieves Cough, MD;  Location: [facility];  Service: Urology;  Laterality: N/A;   TRANSURETHRAL RESECTION OF BLADDER TUMOR N/A 08/16/2021   Procedure: TRANSURETHRAL RESECTION OF BLADDER TUMOR (TURBT);  Surgeon: Nieves Cough, MD;  Location: [facility];  Service: Urology;  Laterality: N/A;   TYMPANOPLASTY Right 05/02/2016   Procedure: TYMPANOPLASTY;  Surgeon: Daniel Karis, MD;  Location: [facility];  Service: ENT;  Laterality: Right;   UMBILICAL HERNIA REPAIR  2017     No Known Allergies    Family History  Problem Relation Age of Onset   Aortic aneurysm Other        Deceased   Cancer Other         Deceased   Cardiomyopathy Other        Alive   Anesthesia problems Neg Hx    Hypotension Neg Hx    Malignant hyperthermia Neg Hx    Pseudochol deficiency Neg Hx      Social History Mr. Fleagle reports that he quit smoking about 16 years ago. His smoking use included cigarettes. He started smoking about 37 years ago. He has a 21 pack-year smoking history. He has never been exposed to tobacco smoke. He quit smokeless tobacco use about 17 years ago. Mr. Helms reports that he does not currently use alcohol .   Review of Systems CONSTITUTIONAL: No weight loss, fever, chills, weakness or fatigue.  HEENT: Eyes: No visual loss, blurred vision, double vision or yellow sclerae.No hearing loss, sneezing, congestion, runny nose or sore throat.  SKIN: No rash or itching.  CARDIOVASCULAR:  RESPIRATORY: No shortness of breath, cough or sputum.  GASTROINTESTINAL: No anorexia, nausea, vomiting or diarrhea. No abdominal pain or blood.  GENITOURINARY: No burning on urination, no polyuria NEUROLOGICAL: No headache, dizziness, syncope, paralysis, ataxia, numbness or tingling in the extremities. No change in bowel or bladder control.  MUSCULOSKELETAL: No muscle, back pain, joint pain or stiffness.  LYMPHATICS: No enlarged nodes. No history of splenectomy.  PSYCHIATRIC: No history of depression or anxiety.  ENDOCRINOLOGIC: No reports of sweating, cold or heat intolerance. No polyuria or polydipsia.  SABRA   Physical Examination There were no vitals filed for this visit. There were no vitals filed for this visit.  Gen: resting comfortably, no acute distress HEENT: no scleral icterus, pupils equal round and reactive, no palptable cervical adenopathy,  CV Resp: Clear to auscultation bilaterally GI: abdomen is soft, non-tender, non-distended, normal bowel sounds, no hepatosplenomegaly MSK: extremities are warm, no edema.  Skin: warm, no rash Neuro:  no focal deficits Psych: appropriate  affect   Diagnostic Studies  11/2017 echo Study Conclusions   - Left ventricle: The cavity size was mildly dilated.  Wall   thickness was increased in a pattern of mild LVH. Systolic   function was normal. The estimated ejection fraction was in the   range of 50% to 55%. Although no diagnostic regional wall motion   abnormality was identified, this possibility cannot be completely   excluded on the basis of this study. The study was not   technically sufficient to allow evaluation of LV diastolic   dysfunction due to atrial fibrillation. - Aortic valve: Moderately calcified annulus. Trileaflet. - Mitral valve: Mildly thickened leaflets . Systolic bowing without   prolapse. There was moderate regurgitation. - Left atrium: The atrium was mildly dilated. - Right ventricle: The cavity size was mildly dilated. - Right atrium: The atrium was moderately dilated. Central venous   pressure (est): 8 mm Hg. - Atrial septum: No defect or patent foramen ovale was identified. - Tricuspid valve: There was mild-moderate regurgitation. - Pulmonary arteries: PA peak pressure: 29 mm Hg (S). - Pericardium, extracardiac: A small pericardial effusion was   identified anterior to the heart.       09/2021 Zio patch 14 day monitor Patient was in afib throughout the study, range 64-213 bpm, average HR 110 Rare ventricular ectopy in the form of isolated PVCs, couplets, triplets. Rare short episodes of wide complex tachycardia that appear more consistent with SVT with aberrancy as opposed to NSVT No symptoms reported Overall 100% afib burden with very high heart rates at times.     Patch Wear Time:  14 days and 0 hours (2022-12-06T07:52:31-0500 to 2022-12-20T07:52:35-0500)   7 Ventricular Tachycardia runs occurred, the run with the fastest interval lasting 4 beats with a max rate of 197 bpm, the longest lasting 11 beats with an avg rate of 145 bpm. Atrial Fibrillation occurred continuously (100% burden),  ranging from 64-213  bpm (avg of 110 bpm). Intermittent Bundle Sarissa Dern Block was present. Isolated VEs were rare (<1.0%, 14535), VE Couplets were rare (<1.0%, 1056), and VE Triplets were rare (<1.0%, 13). Ventricular Bigeminy was present. MD notification criteria for Rapid  Atrial Fibrillation met - report posted prior to notification per account request (ES).   11/2021 echo 1. Left ventricular ejection fraction, by estimation, is 35 to 40%. The  left ventricle has moderately decreased function. The left ventricle  demonstrates global hypokinesis. There is moderate left ventricular  hypertrophy. Left ventricular diastolic  parameters are indeterminate.   2. Right ventricular systolic function is moderately reduced. The right  ventricular size is severely enlarged. There is mildly elevated pulmonary  artery systolic pressure.   3. Left atrial size was severely dilated.   4. Right atrial size was severely dilated.   5. The mitral valve is normal in structure. Mild mitral valve  regurgitation. No evidence of mitral stenosis.   6. The tricuspid valve is abnormal. Tricuspid valve regurgitation is  moderate to severe.   7. The aortic valve is tricuspid. Aortic valve regurgitation is not  visualized. No aortic stenosis is present.   8. The inferior vena cava is dilated in size with <50% respiratory  variability, suggesting right atrial pressure of 15 mmHg.      05/2022 echo 1. Left ventricular ejection fraction, by estimation, is 60 to 65%. The  left ventricle has normal function. The left ventricle has no regional  wall motion abnormalities. The average left ventricular global  longitudinal strain is -17.7 %. The global  longitudinal strain is normal.   2. Right ventricular systolic function is normal. The right ventricular  size is normal.  3. The aortic valve is tricuspid.   4. Limited echo to evaluate LV function    Assessment and Plan  1. Persistent Afib - did not tolerate toprol   150 due to low bp's, back on 100mg  daily - tolerating eliquis  now (previously had hematuria, pancytopenia for bladder cancer which he has completed treatment).  - severe LAE with LAVI in 60s - Difficultly rate controlling due to bp's, rates look to be improved since digoxin  added by EP.    - continues to do well on toprol  and digoxin , continue current meds     2. 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 Preoperative evaluatin - considering gall bladder surgery - no active acute cardiac conditions - tolerates greater than 4 METs regular without symptoms - recommend proceeding with gallbladder surgery from cardiac standpiont. Can hold eliquis  2 days prior, resume day after.       Dorn PHEBE Ross, M.D., F.A.C.C.

## 2024-07-28 NOTE — Patient Instructions (Addendum)
 Medication Instructions:   Change your Lasix  to 60mg  every other day alternating with 40mg  every other day Continue all other medications.     Labwork:  none  Testing/Procedures:  Your physician has recommended that you wear a 14 day event monitor. Event monitors are medical devices that record the heart's electrical activity. Doctors most often us  these monitors to diagnose arrhythmias. Arrhythmias are problems with the speed or rhythm of the heartbeat. The monitor is a small, portable device. You can wear one while you do your normal daily activities. This is usually used to diagnose what is causing palpitations/syncope (passing out).  Office will contact with results via phone, letter or mychart.     Follow-Up:  4-6 weeks   Any Other Special Instructions Will Be Listed Below (If Applicable).   If you need a refill on your cardiac medications before your next appointment, please call your pharmacy.

## 2024-07-29 ENCOUNTER — Ambulatory Visit (INDEPENDENT_AMBULATORY_CARE_PROVIDER_SITE_OTHER): Admitting: Otolaryngology

## 2024-07-29 ENCOUNTER — Ambulatory Visit: Attending: Cardiology

## 2024-07-29 ENCOUNTER — Encounter (INDEPENDENT_AMBULATORY_CARE_PROVIDER_SITE_OTHER): Payer: Self-pay | Admitting: Otolaryngology

## 2024-07-29 ENCOUNTER — Other Ambulatory Visit: Payer: Self-pay | Admitting: Cardiology

## 2024-07-29 VITALS — BP 118/76 | HR 78 | Temp 97.7°F | Ht 72.0 in | Wt 195.0 lb

## 2024-07-29 DIAGNOSIS — H9011 Conductive hearing loss, unilateral, right ear, with unrestricted hearing on the contralateral side: Secondary | ICD-10-CM | POA: Diagnosis not present

## 2024-07-29 DIAGNOSIS — H903 Sensorineural hearing loss, bilateral: Secondary | ICD-10-CM

## 2024-07-29 DIAGNOSIS — R42 Dizziness and giddiness: Secondary | ICD-10-CM

## 2024-07-29 DIAGNOSIS — H7011 Chronic mastoiditis, right ear: Secondary | ICD-10-CM | POA: Diagnosis not present

## 2024-07-30 NOTE — Progress Notes (Signed)
 Patient ID: Kenneth [name], male   DOB: [birth year], 73 y.o.   MRN: [id number]  Follow-up: Chronic right otomastoiditis  HPI: The patient is a 73 year old male who returns today for follow-up evaluation.  The patient has a history of chronic right ear infection and mastoiditis.  He underwent right tympanomastoidectomy surgery to treat the chronic infection.  After the surgery, he continued to have recurrent right ear drainage.  He has been using Ciprodex as needed.  Patient returns today complaining of intermittent right ear drainage.  He denies any otalgia or recent change in his hearing.  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  Exam: General: Communicates without difficulty, well nourished, no acute distress. Head: Normocephalic, no evidence injury, no tenderness, facial buttresses intact without stepoff. Face/sinus: No tenderness to palpation and percussion. Facial movement is normal and symmetric. Eyes: PERRL, EOMI. No scleral icterus, conjunctivae clear. Neuro: CN II exam reveals vision grossly intact.  No nystagmus at any point of gaze. Ears: Auricles well formed without lesions.  A large mastoid bowl is noted. The mastoid bowl is noted to be mildly inflammed. No evidence of infection or cholesteatoma.  Nose: External evaluation reveals normal support and skin without lesions.  Dorsum is intact.  Anterior rhinoscopy reveals congested mucosa over anterior aspect of inferior turbinates and intact septum.  No purulence noted. Oral:  Oral cavity and oropharynx are intact, symmetric, without erythema or edema.  Mucosa is moist without lesions. Neck: Full range of motion without pain.  There is no significant lymphadenopathy.  No masses palpable.  Thyroid  bed within normal limits to palpation.  Parotid glands and submandibular glands equal bilaterally without mass.  Trachea is midline. Neuro:  CN 2-12 grossly intact.     Assessment: 1.  Chronic right ear otomastoiditis.  His right mastoid bowl is again noted to be mildly inflamed but without any evidence of acute infection or recurrent cholesteatoma. 3.  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   Plan: 1.  The physical exam findings are reviewed with the patient. 2.  Continue the use of Ciprodex as needed. 3.  Continue the use of his hearing aids. 4.  The patient will return for reevaluation in 4 months.

## 2024-08-06 ENCOUNTER — Telehealth (INDEPENDENT_AMBULATORY_CARE_PROVIDER_SITE_OTHER): Payer: Self-pay | Admitting: Otolaryngology

## 2024-08-06 ENCOUNTER — Telehealth (INDEPENDENT_AMBULATORY_CARE_PROVIDER_SITE_OTHER): Payer: Self-pay

## 2024-08-06 NOTE — Telephone Encounter (Signed)
 Ciprodex ear drops was called in at [facility] in [place] with prn refill for one year.

## 2024-08-07 ENCOUNTER — Telehealth (INDEPENDENT_AMBULATORY_CARE_PROVIDER_SITE_OTHER): Payer: Self-pay

## 2024-08-07 NOTE — Telephone Encounter (Signed)
 Pt called stating that his ear drops his new insurance [employer] does not cover the prescription and was wondering if there is another prescription that Karis could put in that will work as well as the ones he has now. Pt requested a call back.

## 2024-08-12 NOTE — Telephone Encounter (Signed)
 See recent note for update

## 2024-08-13 ENCOUNTER — Other Ambulatory Visit: Payer: Self-pay | Admitting: *Deleted

## 2024-08-13 NOTE — Patient Instructions (Signed)
 Visit Information  Thank you for taking time to visit with me today. Please don't hesitate to contact me if I can be of assistance to you before our next scheduled appointment.  Your next care management appointment is by telephone on 09/09/2024 at 2 pm  Please call the care guide team at [phone] if you need to cancel, schedule, or reschedule an appointment.   Please call the Suicide and Crisis Lifeline: 988 call the [place]  National Suicide Prevention Lifeline: 219 704 7659 or TTY: 289-802-2901 TTY 228-051-9706) to talk to a trained counselor call 1-800-273-TALK (toll free, 24 hour hotline) if you are experiencing a Mental Health or Behavioral Health Crisis or need someone to talk to.  Olam Ku, RN, BSN [place]  [facility], [facility] RN Care Manager Direct Dial: [phone]  Fax: [phone]

## 2024-08-13 NOTE — Patient Outreach (Signed)
Complex Care Management   Visit Note  08/13/2024  Name:  Kenneth [name] MRN: [id number] DOB: [birth year]  Situation: Referral received for Complex Care Management related to Atrial Fibrillation I obtained verbal consent from Patient.  Visit completed with Patient  on the phone  Background:   Past Medical History:  Diagnosis Date   Anticoagulant long-term use    eliquis --- managed by cardiology   Arthritis    osteoporosis   Atrial fibrillation, unspecified type [facility]) 2018   cardiologist--- dr j. branch   Bladder neoplasm    Bradycardia    COPD (chronic obstructive pulmonary disease) (HCC)    pulled from [facility] report. Added by [facility] 03/06/24   Gallbladder polyp    followed by surgeon, dr r. cathy (lov note in epic 06-27-2021 stated surgical management after pt has completed bladder cancer treatment   GERD (gastroesophageal reflux disease)    Heart murmur    History of drug abuse in remission (HCC)    per pt in remission since 02-26-1999   History of hepatitis C 12/2020   followed by dr d. eartha flavors (aph/ [facility] and hepatology);  dx 03/ 2022, liver bx 01-12-2021 g2hepatitis, f2;  completed 12 wks treatment 04-14-2021 w/ epclusa, hcv undetectable 04-21-2021   Mitral valve regurgitation    followed by dr branch---  last echo in epic 02-10-2021  moderate MR without stenosis   Osteoporosis    Wears dentures    full upper and lower partial   Wears hearing aid in left ear     Assessment: Pt reports he is not taking the prescribed Lasix  as ordered as his previous symptoms have resolved related to swelling. States he has a pending appointment on 11/14 with Dr. Alvan (CAD) and he will further discuss this matter with his provider at that time. RNCM stress the importance of contact his provider at anytime with he is not taking his medications as prescribed as some medication can not be abruptly stopped (verbalized an understanding). Patient Reported  Symptoms:  Cognitive Cognitive Status: Able to follow simple commands, Alert and oriented to person, place, and time, Normal speech and language skills Cognitive/Intellectual Conditions Management [RPT]: None reported or documented in medical history or problem list   Health Maintenance Behaviors: Annual physical exam  Neurological Neurological Review of Symptoms: No symptoms reported Neurological Management Strategies: Routine screening  HEENT HEENT Symptoms Reported: Ear discharge HEENT Management Strategies: Routine screening HEENT Comment: Patient continues to f/u q 4 months to Dr. Karis (ENT) for cleaning due to hx Mastoiditis Ear problem(s)  Cardiovascular Cardiovascular Symptoms Reported: Palpitations, Lightheadness, Dizziness, Fatigue Other Cardiovascular Symptoms: Continues to have symptoms and currently wearing a heart monitor and will send the device back on Sat for further interventions. Cardiovascular Management Strategies: Coping strategies, Medication therapy, Routine screening Weight: 195 lb (88.5 kg) Cardiovascular Self-Management Outcome: 4 (good)  Respiratory Respiratory Symptoms Reported: Shortness of breath Other Respiratory Symptoms: History of SOB with recovery with rest. Respiratory Management Strategies: Adequate rest, Routine screening, Medication therapy, Coping strategies, Oxygen  therapy Respiratory Self-Management Outcome: 4 (good)  Endocrine Endocrine Symptoms Reported: No symptoms reported Is patient diabetic?: No Endocrine Self-Management Outcome: 4 (good)  Gastrointestinal Gastrointestinal Symptoms Reported: No symptoms reported      Genitourinary Genitourinary Symptoms Reported: No symptoms reported Genitourinary Management Strategies: Coping strategies  Integumentary Integumentary Symptoms Reported: No symptoms reported Skin Management Strategies: Routine screening  Musculoskeletal Musculoskelatal Symptoms Reviewed: No symptoms  reported Musculoskeletal Management Strategies: Adequate rest  Psychosocial Psychosocial Symptoms Reported: No symptoms reported Behavioral Management Strategies: Support system, Abstinence from substances, Coping strategies Behavioral Health Self-Management Outcome: 5 (very good) Behavioral Health Comment: Continue AA meeting 1-2  times Major Change/Loss/Stressor/Fears (CP): Denies      There were no vitals filed for this visit.  Medications Reviewed Today     Reviewed by Alvia Olam BIRCH, RN (Registered Nurse) on 08/13/24 at 1427  Med List Status: <None>   Medication Order Taking? Sig Documenting Provider Last Dose Status Informant  acetaminophen  (TYLENOL ) 500 MG tablet [id number] Yes Take 500-1,000 mg by mouth every 6 (six) hours as needed for moderate pain. [provider]  Active Self  alendronate (FOSAMAX) 70 MG tablet [id number] Yes Take 70 mg by mouth once a week. Takes on Thursday's ;  Take with a full glass of water  on an empty stomach. [provider]  Active Self  apixaban  (ELIQUIS ) 5 MG TABS tablet [id number] Yes Take 1 tablet (5 mg total) by mouth 2 (two) times daily. Miriam Norris, NP  Active   Ascorbic Acid (VITAMIN C) 1000 MG tablet [id number] Yes Take 1,000 mg by mouth 2 (two) times daily. [provider]  Active Self  calcium -vitamin D (OSCAL WITH D) 500-200 MG-UNIT tablet [id number] Yes Take 1 tablet by mouth 2 (two) times daily. [provider]  Active Self  CIPROFLOXACIN -DEXAMETHASONE  OT [id number] Yes Place 3 drops in ear(s) daily as needed (ear infection). [provider]  Active Self  digoxin  (LANOXIN ) 0.125 MG tablet [id number] Yes Take 1 tablet (0.125 mg total) by mouth daily. Miriam Norris, NP  Active   furosemide  (LASIX ) 40 MG tablet [id number]  Take 60mg  every other day alternating with 40mg  every other day  Patient not taking: Reported on 08/13/2024   Alvan Dorn FALCON, MD  Active   loperamide (IMODIUM A-D) 2 MG  tablet [id number] Yes Take 2 mg by mouth 4 (four) times daily as needed for diarrhea or loose stools. [provider]  Active Self  Magnesium  250 MG TABS [id number] Yes Take 250 mg by mouth daily. [provider]  Active Self  metoprolol  succinate (TOPROL -XL) 25 MG 24 hr tablet [id number] Yes Take 1 tablet (25 mg total) by mouth daily. Miriam Norris, NP  Active   Multiple Vitamins-Minerals (MULTIVITAMIN PO) [id number] Yes Take 1 tablet by mouth daily. [provider]  Active Self  omeprazole (PRILOSEC) 20 MG capsule [id number] Yes Take 20 mg by mouth daily. [provider]  Active Self  Polyethyl Glycol-Propyl Glycol (SYSTANE OP) [id number] Yes Place 1 drop into both eyes daily as needed (dry eyes). [provider]  Active Self  potassium chloride  SA (KLOR-CON  M) 20 MEQ tablet [id number] Yes Take 1 tablet by mouth once daily Branch, Jonathan F, MD  Active   pseudoephedrine (SUDAFED) 30 MG tablet [id number] Yes Take 30 mg by mouth daily as needed for congestion. [provider]  Active Self  sildenafil (REVATIO) 20 MG tablet [id number] Yes Take 20-60 mg by mouth daily as needed (erectile dysfunction). [provider]  Active Self  sodium chloride  flush (NS) 0.9 % injection 3 mL [id number]   Miriam Norris, NP  Active             Recommendation:   PCP Follow-up Continue Current Plan of Care  Follow Up Plan:   Telephone follow up appointment date/time:  09/09/2024 @ 2pm  Olam Alvia, RN, BSN [place]  [facility], [facility] RN Care Manager Direct Dial: [phone]  Fax:  [phone]       

## 2024-08-15 ENCOUNTER — Telehealth (INDEPENDENT_AMBULATORY_CARE_PROVIDER_SITE_OTHER): Payer: Self-pay

## 2024-08-15 ENCOUNTER — Telehealth (INDEPENDENT_AMBULATORY_CARE_PROVIDER_SITE_OTHER): Payer: Self-pay | Admitting: Otolaryngology

## 2024-08-15 NOTE — Telephone Encounter (Signed)
 Insurance called about a prior authorization I stated that the doctor and his Ma were in with patients and asked if I could get a call back number the person I talked to then told me they dont have one and to give the authorization to the patient. We can not do that then when I got off the phone and went to give the ma the message the insurance call back again and spoke with [facility] about the same thing so I informed holly what was going on and the run down.

## 2024-08-15 NOTE — Telephone Encounter (Signed)
 Ciprodex ear drops  are  not covered by patient 's health plan. Per Dr. Karis , I called in Prednisolone 1 % ophthalmic drops and Ciprofloxin 0.3 % ophthalmic drops. [facility] in [place] prn refill for one year. I left his information on patient's voice mail.

## 2024-08-21 ENCOUNTER — Ambulatory Visit: Admitting: Cardiology
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
## 2024-08-22 ENCOUNTER — Ambulatory Visit: Admitting: Cardiology

## 2024-08-22 DIAGNOSIS — R42 Dizziness and giddiness: Secondary | ICD-10-CM | POA: Diagnosis not present

## 2024-08-26 DIAGNOSIS — Z299 Encounter for prophylactic measures, unspecified: Secondary | ICD-10-CM | POA: Diagnosis not present

## 2024-08-26 DIAGNOSIS — R059 Cough, unspecified: Secondary | ICD-10-CM | POA: Diagnosis not present

## 2024-08-27 ENCOUNTER — Ambulatory Visit: Admitting: Cardiology

## 2024-08-28 DIAGNOSIS — Z87891 Personal history of nicotine dependence: Secondary | ICD-10-CM | POA: Diagnosis not present

## 2024-08-28 DIAGNOSIS — D6869 Other thrombophilia: Secondary | ICD-10-CM | POA: Diagnosis not present

## 2024-08-28 DIAGNOSIS — I4891 Unspecified atrial fibrillation: Secondary | ICD-10-CM | POA: Diagnosis not present

## 2024-08-28 DIAGNOSIS — F1021 Alcohol dependence, in remission: Secondary | ICD-10-CM | POA: Diagnosis not present

## 2024-08-28 DIAGNOSIS — Z7901 Long term (current) use of anticoagulants: Secondary | ICD-10-CM | POA: Diagnosis not present

## 2024-08-28 DIAGNOSIS — I509 Heart failure, unspecified: Secondary | ICD-10-CM | POA: Diagnosis not present

## 2024-08-28 DIAGNOSIS — M810 Age-related osteoporosis without current pathological fracture: Secondary | ICD-10-CM | POA: Diagnosis not present

## 2024-08-28 DIAGNOSIS — I110 Hypertensive heart disease with heart failure: Secondary | ICD-10-CM | POA: Diagnosis not present

## 2024-08-28 DIAGNOSIS — K219 Gastro-esophageal reflux disease without esophagitis: Secondary | ICD-10-CM | POA: Diagnosis not present

## 2024-09-01 DIAGNOSIS — N281 Cyst of kidney, acquired: Secondary | ICD-10-CM | POA: Diagnosis not present

## 2024-09-01 DIAGNOSIS — N5203 Combined arterial insufficiency and corporo-venous occlusive erectile dysfunction: Secondary | ICD-10-CM | POA: Diagnosis not present

## 2024-09-01 DIAGNOSIS — Z8551 Personal history of malignant neoplasm of bladder: Secondary | ICD-10-CM | POA: Diagnosis not present

## 2024-09-02 DIAGNOSIS — H40013 Open angle with borderline findings, low risk, bilateral: Secondary | ICD-10-CM | POA: Diagnosis not present

## 2024-09-02 DIAGNOSIS — H40053 Ocular hypertension, bilateral: Secondary | ICD-10-CM | POA: Diagnosis not present

## 2024-09-02 DIAGNOSIS — H35373 Puckering of macula, bilateral: Secondary | ICD-10-CM | POA: Diagnosis not present

## 2024-09-02 DIAGNOSIS — H524 Presbyopia: Secondary | ICD-10-CM | POA: Diagnosis not present

## 2024-09-02 DIAGNOSIS — D3131 Benign neoplasm of right choroid: Secondary | ICD-10-CM | POA: Diagnosis not present

## 2024-09-03 ENCOUNTER — Ambulatory Visit: Attending: Cardiology | Admitting: Cardiology

## 2024-09-03 ENCOUNTER — Ambulatory Visit: Admitting: Cardiology

## 2024-09-03 VITALS — Ht 72.0 in | Wt 197.0 lb

## 2024-09-03 DIAGNOSIS — R42 Dizziness and giddiness: Secondary | ICD-10-CM

## 2024-09-03 DIAGNOSIS — I4819 Other persistent atrial fibrillation: Secondary | ICD-10-CM | POA: Diagnosis not present

## 2024-09-03 MED ORDER — MIDODRINE HCL 2.5 MG PO TABS: 2.5000 mg | ORAL_TABLET | Freq: Two times a day (BID) | ORAL | 3 refills | Status: AC

## 2024-09-03 MED ORDER — FUROSEMIDE 40 MG PO TABS: 40.0000 mg | ORAL_TABLET | Freq: Every day | ORAL | 11 refills | Status: AC | PRN

## 2024-09-03 NOTE — Patient Instructions (Signed)
 Medication Instructions:  Your physician has recommended you make the following change in your medication:   -Change Lasix  to 40 mg as needed for swelling -Start Midodrine  2.5 mg twice daily   *If you need a refill on your cardiac medications before your next appointment, please call your pharmacy*  Lab Work: None If you have labs (blood work) drawn today and your tests are completely normal, you will receive your results only by: MyChart Message (if you have MyChart) OR A paper copy in the mail If you have any lab test that is abnormal or we need to change your treatment, we will call you to review the results.  Testing/Procedures: None  Follow-Up: At , you and your health needs are our priority.  As part of our continuing mission to provide you with exceptional heart care, our providers are all part of one team.  This team includes your primary Cardiologist (physician) and Advanced Practice Providers or APPs (Physician Assistants and Nurse Practitioners) who all work together to provide you with the care you need, when you need it.  Your next appointment:   4 week(s)  Provider:   Dorn Ross, MD    We recommend signing up for the patient portal called MyChart.  Sign up information is provided on this After Visit Summary.  MyChart is used to connect with patients for Virtual Visits (Telemedicine).  Patients are able to view lab/test results, encounter notes, upcoming appointments, etc.  Non-urgent messages can be sent to your provider as well.   To learn more about what you can do with MyChart, go to [email or web address].   Other Instructions

## 2024-09-03 NOTE — Progress Notes (Signed)
 Clinical Summary Kenneth [name] is a 73 y.o.male  seen today for follow up of the following medical problems.    1. Afib 09/2021 monitor: 100% afib, Patient was in afib throughout the study, range 64-213 bpm, average HR 110 -prevoiusly off elqiuis due to hematuria and pancytopenia related to bladder cancer and chemo. Most recently has been on anticoag and tolearing.   - very severe left atrial enlargement, LAVI 63. Had not been able to be on eliquis  ppveriously Have not pursued trying to reestablish SR.    - seen 01/12/22 by Dr Hobart - low bps 80s/60s, presyncopal - toprol  lowered to 100mg  daily, losartan  held, lasix  decreased to 40mg  daily - bp's improved today 110/64 - remains on eliquis , no recent hematuria.      - seen by EP, started on digoxin  - if failed consideration for av nodal ablation and ppm - has done very well since adding digoxin  to his beta blocker   04/2024 monitor: afib rates 45 to 127, avg 73. 8% PVCs. No significant pauses.  - compliant with meds - can have some palpitations at times       2. Dizziness with standing - when standing can at times get light headed, dizzy. - working to stay well hydrated -  2 bottles of water , cranberry,apple juice, orange juice. MTN dew, coke zero   -orthostatisc were negative with standing but SBP down to 92/61 with standing.   -last visit we changed lasix  to 60mg  alt days with 40mg   -monitor 100% afib, variable rates. Symptoms reported with RVR but also controlled rates, PVCs at times.  Orthostatics today DBP 94-->68 sitting to standing, SBP down 31 points sitting to standing.  - chronic dizziness since our last visit - has been holding off on fluid pill all together.     Other medical problems not addressed this visit   2. Chronic systolic/diastlic HF.  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  - has compression stockings.      3.Bladder cancer - followed by oncology and urology - undergoing chemo and radiation, along with urology sTURBT  Oct 14 2021 onc note he continues to receive definitive therapy with radiation and weekly cisplatin .  Chemotherapy was withheld on October 06, 2021 due to cytopenia   - from note has compelted current treatment course, now under observation     4. Lung cancer screening - atherosclerosis and coronary atherosclerosis noted        AAA screen Male over 64 with tobacco history. 09/2011 abdominal US  without aneusyrm.  Past Medical History:  Diagnosis Date   Anticoagulant long-term use    eliquis --- managed by cardiology   Arthritis    osteoporosis   Atrial fibrillation, unspecified type [facility]) 2018   cardiologist--- dr j. Marcellis Frampton   Bladder neoplasm    Bradycardia    COPD (chronic obstructive pulmonary disease) (HCC)    pulled from [facility] report. Added by [facility] 03/06/24   Gallbladder polyp    followed by surgeon, dr r. cathy (lov note in epic 06-27-2021 stated surgical management after pt has completed bladder cancer treatment   GERD (gastroesophageal reflux disease)    Heart murmur  History of drug abuse in remission [facility])    per pt in remission since 02-26-1999   History of hepatitis C 12/2020   followed by dr d. eartha flavors (aph/ [facility] and hepatology);  dx 03/ 2022, liver bx 01-12-2021 g2hepatitis, f2;  completed 12 wks treatment 04-14-2021 w/ epclusa, hcv undetectable 04-21-2021   Mitral valve regurgitation    followed by dr Oanh Devivo---  last echo in epic 02-10-2021  moderate MR without stenosis   Osteoporosis    Wears dentures    full upper and lower partial   Wears hearing aid in left ear      No Known Allergies   Current Outpatient Medications  Medication  Sig Dispense Refill   acetaminophen  (TYLENOL ) 500 MG tablet Take 500-1,000 mg by mouth every 6 (six) hours as needed for moderate pain.     alendronate (FOSAMAX) 70 MG tablet Take 70 mg by mouth once a week. Takes on Thursday's ;  Take with a full glass of water  on an empty stomach.     apixaban  (ELIQUIS ) 5 MG TABS tablet Take 1 tablet (5 mg total) by mouth 2 (two) times daily. 180 tablet 1   Ascorbic Acid (VITAMIN C) 1000 MG tablet Take 1,000 mg by mouth 2 (two) times daily.     calcium -vitamin D (OSCAL WITH D) 500-200 MG-UNIT tablet Take 1 tablet by mouth 2 (two) times daily.     CIPROFLOXACIN -DEXAMETHASONE  OT Place 3 drops in ear(s) daily as needed (ear infection).     digoxin  (LANOXIN ) 0.125 MG tablet Take 1 tablet (0.125 mg total) by mouth daily. 90 tablet 1   furosemide  (LASIX ) 40 MG tablet Take 60mg  every other day alternating with 40mg  every other day (Patient not taking: Reported on 08/13/2024)     loperamide (IMODIUM A-D) 2 MG tablet Take 2 mg by mouth 4 (four) times daily as needed for diarrhea or loose stools.     Magnesium  250 MG TABS Take 250 mg by mouth daily.     metoprolol  succinate (TOPROL -XL) 25 MG 24 hr tablet Take 1 tablet (25 mg total) by mouth daily. 90 tablet 1   Multiple Vitamins-Minerals (MULTIVITAMIN PO) Take 1 tablet by mouth daily.     omeprazole (PRILOSEC) 20 MG capsule Take 20 mg by mouth daily.     Polyethyl Glycol-Propyl Glycol (SYSTANE OP) Place 1 drop into both eyes daily as needed (dry eyes).     potassium chloride  SA (KLOR-CON  M) 20 MEQ tablet Take 1 tablet by mouth once daily 90 tablet 1   pseudoephedrine (SUDAFED) 30 MG tablet Take 30 mg by mouth daily as needed for congestion.     sildenafil (REVATIO) 20 MG tablet Take 20-60 mg by mouth daily as needed (erectile dysfunction).     Current Facility-Administered Medications  Medication Dose Route Frequency Provider Last Rate Last Admin   sodium chloride  flush (NS) 0.9 % injection 3 mL  3 mL Intravenous Q12H  Miriam Norris, NP         Past Surgical History:  Procedure Laterality Date   APPENDECTOMY     age 62   49 LIFT AND BLEPHAROPLASTY Bilateral    APPROX. 2017   CATARACT EXTRACTION W/ INTRAOCULAR LENS IMPLANT Bilateral 2018   HEMORROIDECTOMY     2010 @[facility]  and 2018 @ [facility]   HYDROCELE EXCISION Right 08/13/2017   Procedure: RIGHT HYDROCELECTOMY;  Surgeon: Sherrilee Belvie CROME, MD;  Location: [facility];  Service: Urology;  Laterality: Right;   HYDROCELE EXCISION Right 11/12/2017  Procedure: RIGHT HYDROCELECTOMY;  Surgeon: Sherrilee Belvie CROME, MD;  Location: [facility];  Service: Urology;  Laterality: Right;   INGUINAL HERNIA REPAIR Bilateral    APPROX.   2010;   WITH UMBILICAL HERNIA AND EPIGRASTIC HERNIA REPAIRS   INGUINAL HERNIA REPAIR  09/11/2011   Procedure: HERNIA REPAIR INGUINAL ADULT;  Surgeon: Elsie GORMAN Holland;  Location: [facility];  Service: General;  Laterality: Right;  Recurrent Right Inguinal Hernia Repair   MANDIBLE RECONSTRUCTION     x2  last one 1980;   from [facility]  ( has retained hardware)   MIDDLE EAR SURGERY Right 02/19/2015   by Dr Karis ;  tympanomastoidectomy   RIGHT/LEFT HEART CATH AND CORONARY ANGIOGRAPHY N/A 01/30/2023   Procedure: RIGHT/LEFT HEART CATH AND CORONARY ANGIOGRAPHY;  Surgeon: Anner Alm ORN, MD;  Location: [facility] INVASIVE CV LAB;  Service: Cardiovascular;  Laterality: N/A;   ROTATOR CUFF REPAIR Left 03/31/2016   TRANSURETHRAL RESECTION OF BLADDER TUMOR N/A 06/17/2021   Procedure: TRANSURETHRAL RESECTION OF BLADDER TUMOR (TURBT) WITH POST OPERATIVE INSTILLATION OF GEMCITABINE ;  Surgeon: Nieves Cough, MD;  Location: [facility];  Service: Urology;  Laterality: N/A;   TRANSURETHRAL RESECTION OF BLADDER TUMOR N/A 08/16/2021   Procedure: TRANSURETHRAL RESECTION OF BLADDER TUMOR (TURBT);  Surgeon: Nieves Cough, MD;  Location: [facility];  Service: Urology;  Laterality: N/A;   TYMPANOPLASTY Right 05/02/2016   Procedure:  TYMPANOPLASTY;  Surgeon: Daniel Karis, MD;  Location: [facility];  Service: ENT;  Laterality: Right;   UMBILICAL HERNIA REPAIR  2017     No Known Allergies    Family History  Problem Relation Age of Onset   Aortic aneurysm Other        Deceased   Cancer Other        Deceased   Cardiomyopathy Other        Alive   Anesthesia problems Neg Hx    Hypotension Neg Hx    Malignant hyperthermia Neg Hx    Pseudochol deficiency Neg Hx      Social History Mr. Pickup reports that he quit smoking about 17 years ago. His smoking use included cigarettes. He started smoking about 38 years ago. He has a 21 pack-year smoking history. He has never been exposed to tobacco smoke. He quit smokeless tobacco use about 17 years ago. Mr. Stegman reports that he does not currently use alcohol .   Physical Examination Today's Vitals   09/03/24 1424  SpO2: 96%  Weight: 197 lb (89.4 kg)  Height: 6' (1.829 m)   Body mass index is 26.72 kg/m.  Gen: resting comfortably, no acute distress HEENT: no scleral icterus, pupils equal round and reactive, no palptable cervical adenopathy,  CV: irreg, no m/r,g no jvd Resp: Clear to auscultation bilaterally GI: abdomen is soft, non-tender, non-distended, normal bowel sounds, no hepatosplenomegaly MSK: extremities are warm, no edema.  Skin: warm, no rash Neuro:  no focal deficits Psych: appropriate affect   Diagnostic Studies 11/2017 echo Study Conclusions   - Left ventricle: The cavity size was mildly dilated. Wall   thickness was increased in a pattern of mild LVH. Systolic   function was normal. The estimated ejection fraction was in the   range of 50% to 55%. Although no diagnostic regional wall motion   abnormality was identified, this possibility cannot be completely   excluded on the basis of this study. The study was not   technically sufficient to allow evaluation of LV diastolic   dysfunction due  to atrial fibrillation. - Aortic valve:  Moderately calcified annulus. Trileaflet. - Mitral valve: Mildly thickened leaflets . Systolic bowing without   prolapse. There was moderate regurgitation. - Left atrium: The atrium was mildly dilated. - Right ventricle: The cavity size was mildly dilated. - Right atrium: The atrium was moderately dilated. Central venous   pressure (est): 8 mm Hg. - Atrial septum: No defect or patent foramen ovale was identified. - Tricuspid valve: There was mild-moderate regurgitation. - Pulmonary arteries: PA peak pressure: 29 mm Hg (S). - Pericardium, extracardiac: A small pericardial effusion was   identified anterior to the heart.       09/2021 Zio patch 14 day monitor Patient was in afib throughout the study, range 64-213 bpm, average HR 110 Rare ventricular ectopy in the form of isolated PVCs, couplets, triplets. Rare short episodes of wide complex tachycardia that appear more consistent with SVT with aberrancy as opposed to NSVT No symptoms reported Overall 100% afib burden with very high heart rates at times.     Patch Wear Time:  14 days and 0 hours (2022-12-06T07:52:31-0500 to 2022-12-20T07:52:35-0500)   7 Ventricular Tachycardia runs occurred, the run with the fastest interval lasting 4 beats with a max rate of 197 bpm, the longest lasting 11 beats with an avg rate of 145 bpm. Atrial Fibrillation occurred continuously (100% burden), ranging from 64-213  bpm (avg of 110 bpm). Intermittent Bundle Niki Payment Block was present. Isolated VEs were rare (<1.0%, 14535), VE Couplets were rare (<1.0%, 1056), and VE Triplets were rare (<1.0%, 13). Ventricular Bigeminy was present. MD notification criteria for Rapid  Atrial Fibrillation met - report posted prior to notification per account request (ES).   11/2021 echo 1. Left ventricular ejection fraction, by estimation, is 35 to 40%. The  left ventricle has moderately decreased function. The left ventricle  demonstrates global hypokinesis. There is  moderate left ventricular  hypertrophy. Left ventricular diastolic  parameters are indeterminate.   2. Right ventricular systolic function is moderately reduced. The right  ventricular size is severely enlarged. There is mildly elevated pulmonary  artery systolic pressure.   3. Left atrial size was severely dilated.   4. Right atrial size was severely dilated.   5. The mitral valve is normal in structure. Mild mitral valve  regurgitation. No evidence of mitral stenosis.   6. The tricuspid valve is abnormal. Tricuspid valve regurgitation is  moderate to severe.   7. The aortic valve is tricuspid. Aortic valve regurgitation is not  visualized. No aortic stenosis is present.   8. The inferior vena cava is dilated in size with <50% respiratory  variability, suggesting right atrial pressure of 15 mmHg.      05/2022 echo 1. Left ventricular ejection fraction, by estimation, is 60 to 65%. The  left ventricle has normal function. The left ventricle has no regional  wall motion abnormalities. The average left ventricular global  longitudinal strain is -17.7 %. The global  longitudinal strain is normal.   2. Right ventricular systolic function is normal. The right ventricular  size is normal.   3. The aortic valve is tricuspid.   4. Limited echo to evaluate LV function       Assessment and Plan   1. Persistent Afib - did not tolerate toprol  150 due to low bp's, back on 100mg  daily - tolerating eliquis  now (previously had hematuria, pancytopenia for bladder cancer which he has completed treatment).  - severe LAE with LAVI in 60s - Difficultly rate controlling due  to bp's, rates look to be improved since digoxin  added by EP.    -recent monitor with high rates at times, will continue meds for time being - perhaps being on midodrine  will allow some room to try higher toprol  dosing again  2. Orthostatic dizzienss - orthostatic today in clinic. Last visit was not technically orthostatic  but low bp's with standing low 90s over 60s - symptoms improved but not resolved with hydration. He is now just taking his lasix  prn as well - will try midodrine  2.5mg  bid.               Dorn PHEBE Ross, M.D.

## 2024-09-09 ENCOUNTER — Encounter: Payer: Self-pay | Admitting: *Deleted

## 2024-09-09 ENCOUNTER — Telehealth: Payer: Self-pay | Admitting: *Deleted

## 2024-09-09 NOTE — Patient Outreach (Addendum)
 Complex Care Management   Visit Note  09/09/2024  Name:  Kenneth [name] MRN: [id number] DOB: Mar 01, 1951  Situation: RNCM spoke with pt today concerning today's scheduled appointment. Pt has requested to reschedule and unable to completed an assessment today.  RNCM will reschedule pt on 09/10/2024 @ 1:30 PM.   Olam Ku, RN, BSN [facility], [facility] RN Care Manager Direct Dial: [phone]  Fax: [phone]

## 2024-09-10 ENCOUNTER — Other Ambulatory Visit: Payer: Self-pay | Admitting: *Deleted

## 2024-09-10 ENCOUNTER — Telehealth: Payer: Self-pay | Admitting: *Deleted

## 2024-09-10 NOTE — Patient Outreach (Signed)
 Complex Care Management   Visit Note  09/10/2024  Name:  Kenneth [name] MRN: [id number] DOB: [birth year]  Situation: Referral received for Complex Care Management related to Atrial Fibrillation I obtained verbal consent from Patient.  Visit completed with Patient  on the phone  Background:   Past Medical History:  Diagnosis Date   Anticoagulant long-term use    eliquis --- managed by cardiology   Arthritis    osteoporosis   Atrial fibrillation, unspecified type [facility]) 2018   cardiologist--- dr j. branch   Bladder neoplasm    Bradycardia    COPD (chronic obstructive pulmonary disease) (HCC)    pulled from [facility] report. Added by [facility] 03/06/24   Gallbladder polyp    followed by surgeon, dr r. cathy (lov note in epic 06-27-2021 stated surgical management after pt has completed bladder cancer treatment   GERD (gastroesophageal reflux disease)    Heart murmur    History of drug abuse in remission (HCC)    per pt in remission since 02-26-1999   History of hepatitis C 12/2020   followed by dr d. eartha flavors (aph/ [facility] and hepatology);  dx 03/ 2022, liver bx 01-12-2021 g2hepatitis, f2;  completed 12 wks treatment 04-14-2021 w/ epclusa, hcv undetectable 04-21-2021   Mitral valve regurgitation    followed by dr branch---  last echo in epic 02-10-2021  moderate MR without stenosis   Osteoporosis    Wears dentures    full upper and lower partial   Wears hearing aid in left ear     Assessment: Patient Reported Symptoms:  Cognitive Cognitive Status: Able to follow simple commands, Alert and oriented to person, place, and time, Normal speech and language skills   Health Maintenance Behaviors: Annual physical exam  Neurological Neurological Review of Symptoms: No symptoms reported Neurological Management Strategies: Routine screening  HEENT HEENT Symptoms Reported: No symptoms reported (Recent respiratory infection- provided treatment which was completed with no  additional symptoms) HEENT Management Strategies: Routine screening HEENT Self-Management Outcome: 4 (good) HEENT Comment: Patient continues to f/u q 4 months to Dr. Karis (ENT) for cleaning due to hx Mastoiditis with next f/u 12/01/2024 Ear problem(s)  Cardiovascular Cardiovascular Symptoms Reported: No symptoms reported Other Cardiovascular Symptoms: Pt reports recent MD visit with orthostatic BP with low bp with ranges at 130/79. Resulting in a recent change in his BP medication much improved. Pt will f/u with provider 12/16 with Dr. Alvan Does patient have uncontrolled Hypertension?: No Cardiovascular Management Strategies: Coping strategies, Routine screening Weight: 197 lb (89.4 kg) Cardiovascular Self-Management Outcome: 4 (good)  Respiratory Respiratory Symptoms Reported: No symptoms reported Other Respiratory Symptoms: History of SOB with recovery with rest. Respiratory Management Strategies: Adequate rest, Medication therapy, Coping strategies Respiratory Self-Management Outcome: 4 (good)  Endocrine Endocrine Symptoms Reported: No symptoms reported Is patient diabetic?: No Endocrine Self-Management Outcome: 4 (good)  Gastrointestinal Gastrointestinal Symptoms Reported: No symptoms reported      Genitourinary Genitourinary Symptoms Reported: No symptoms reported Genitourinary Management Strategies: Coping strategies Genitourinary Self-Management Outcome: 4 (good) Genitourinary Comment: Continues to follow up Dr. Nieves for Urologist (bladder cancer history) with a recent appt 09/01/2024  Integumentary Integumentary Symptoms Reported: No symptoms reported Skin Management Strategies: Routine screening  Musculoskeletal Musculoskelatal Symptoms Reviewed: No symptoms reported Musculoskeletal Management Strategies: Adequate rest, Routine screening      Psychosocial Psychosocial Symptoms Reported: No symptoms reported Behavioral Management Strategies: Abstinence from substances,  Support system, Coping strategies Behavioral Health Self-Management Outcome: 5 (very good) Major Change/Loss/Stressor/Fears (CP): Denies  Techniques to Cope with Loss/Stress/Change: Support group Quality of Family Relationships: helpful Do you feel physically threatened by others?: No     Today's Vitals   09/10/24 1403  Weight: 197 lb (89.4 kg)   Pain Scale: 0-10 Pain Score: 0-No pain  Medications Reviewed Today     Reviewed by Alvia Olam BIRCH, RN (Registered Nurse) on 09/10/24 at 1359  Med List Status: <None>   Medication Order Taking? Sig Documenting Provider Last Dose Status Informant  acetaminophen  (TYLENOL ) 500 MG tablet [id number] Yes Take 500-1,000 mg by mouth every 6 (six) hours as needed for moderate pain. [provider]  Active Self  alendronate (FOSAMAX) 70 MG tablet [id number] Yes Take 70 mg by mouth once a week. Takes on Thursday's ;  Take with a full glass of water  on an empty stomach. [provider]  Active Self  apixaban  (ELIQUIS ) 5 MG TABS tablet [id number] Yes Take 1 tablet (5 mg total) by mouth 2 (two) times daily. Miriam Norris, NP  Active   Ascorbic Acid (VITAMIN C) 1000 MG tablet [id number] Yes Take 1,000 mg by mouth 2 (two) times daily. [provider]  Active Self  calcium -vitamin D (OSCAL WITH D) 500-200 MG-UNIT tablet [id number] Yes Take 1 tablet by mouth 2 (two) times daily. [provider]  Active Self  CIPROFLOXACIN -DEXAMETHASONE  OT [id number] Yes Place 3 drops in ear(s) daily as needed (ear infection). [provider]  Active Self  digoxin  (LANOXIN ) 0.125 MG tablet [id number] Yes Take 1 tablet (0.125 mg total) by mouth daily. Miriam Norris, NP  Active   furosemide  (LASIX ) 40 MG tablet [id number] Yes Take 1 tablet (40 mg total) by mouth daily as needed (Swelling). Alvan Dorn FALCON, MD  Active   loperamide (IMODIUM A-D) 2 MG tablet [id number] Yes Take 2 mg by mouth 4 (four) times daily as needed for diarrhea or loose  stools. [provider]  Active Self  Magnesium  250 MG TABS [id number] Yes Take 250 mg by mouth daily. [provider]  Active Self  metoprolol  succinate (TOPROL -XL) 25 MG 24 hr tablet [id number] Yes Take 1 tablet (25 mg total) by mouth daily. Miriam Norris, NP  Active   midodrine  (PROAMATINE ) 2.5 MG tablet [id number] Yes Take 1 tablet (2.5 mg total) by mouth 2 (two) times daily with a meal. Alvan Dorn FALCON, MD  Active   Multiple Vitamins-Minerals (MULTIVITAMIN PO) [id number] Yes Take 1 tablet by mouth daily. [provider]  Active Self  omeprazole (PRILOSEC) 20 MG capsule [id number] Yes Take 20 mg by mouth daily. [provider]  Active Self  Polyethyl Glycol-Propyl Glycol (SYSTANE OP) [id number] Yes Place 1 drop into both eyes daily as needed (dry eyes). [provider]  Active Self  potassium chloride  SA (KLOR-CON  M) 20 MEQ tablet [id number] Yes Take 1 tablet by mouth once daily Branch, Jonathan F, MD  Active   pseudoephedrine (SUDAFED) 30 MG tablet [id number] Yes Take 30 mg by mouth daily as needed for congestion. [provider]  Active Self  sildenafil (REVATIO) 20 MG tablet [id number] Yes Take 20-60 mg by mouth daily as needed (erectile dysfunction). [provider]  Active Self  sodium chloride  flush (NS) 0.9 % injection 3 mL [id number]   Miriam Norris, NP  Active             Recommendation:   PCP Follow-up Continue Current Plan of Care  Follow Up Plan:   Telephone follow up appointment date/time:  10/13/2024 @ 1000am  Olam Alvia, RN,  BSN [facility], [facility] RN Care Manager Direct Dial: [phone]  Fax: [phone]

## 2024-09-10 NOTE — Patient Instructions (Signed)
 Visit Information  Thank you for taking time to visit with me today. Please don't hesitate to contact me if I can be of assistance to you before our next scheduled appointment.  Your next care management appointment is by telephone on 10/13/2024 at 10:00 am  Please call the care guide team at [phone] if you need to cancel, schedule, or reschedule an appointment.   Please call the Suicide and Crisis Lifeline: 988 call the [place]  National Suicide Prevention Lifeline: 781 780 2777 or TTY: 202 394 6324 TTY (913)527-0910) to talk to a trained counselor call 1-800-273-TALK (toll free, 24 hour hotline) if you are experiencing a Mental Health or Behavioral Health Crisis or need someone to talk to.   Olam Ku, RN, BSN [place]  [facility], [facility] RN Care Manager Direct Dial: [phone]  Fax: [phone]

## 2024-09-23 ENCOUNTER — Encounter: Payer: Self-pay | Admitting: Cardiology

## 2024-09-23 ENCOUNTER — Ambulatory Visit: Admitting: Cardiology

## 2024-09-23 VITALS — BP 130/82 | HR 85 | Ht 72.0 in | Wt 199.0 lb

## 2024-09-23 DIAGNOSIS — R42 Dizziness and giddiness: Secondary | ICD-10-CM

## 2024-09-23 NOTE — Progress Notes (Signed)
 Clinical Summary Kenneth [name] is a 73 y.o.male seen today for follow up of the following medical problems.     This is a focused visit on recent issues with orthostatic dizziness.    1. Dizziness with standing - long history of dizziness when standing or walking - has been orthostatic in clinic previously. SBP dropped 31 points with standing - we cut back his lasix  and eventually changed to just prn, he also increased his hydration. Did not have much improvement in symptoms - - last visit we started midodrine  2.5mg  bid - symptoms markedly improved with midodrine  and essentially resolved        Other medical problems not addressed this visit   1. Afib 09/2021 monitor: 100% afib, Patient was in afib throughout the study, range 64-213 bpm, average HR 110 -prevoiusly off elqiuis due to hematuria and pancytopenia related to bladder cancer and chemo. Most recently has been on anticoag and tolearing.   - very severe left atrial enlargement, LAVI 63. Had not been able to be on eliquis  ppveriously Have not pursued trying to reestablish SR.    - seen 01/12/22 by Dr Hobart - low bps 80s/60s, presyncopal - toprol  lowered to 100mg  daily, losartan  held, lasix  decreased to 40mg  daily - bp's improved today 110/64 - remains on eliquis , no recent hematuria.      - seen by EP, started on digoxin  - if failed consideration for av nodal ablation and ppm - has done very well since adding digoxin  to his beta blocker   04/2024 monitor: afib rates 45 to 127, avg 73. 8% PVCs. No significant pauses.  - compliant with meds - can have some palpitations at times     2. Chronic systolic/diastlic HF.  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   - has compression stockings.      3.Bladder cancer - followed by oncology and urology - undergoing chemo and radiation, along with urology sTURBT  Oct 14 2021 onc note he continues to receive definitive therapy with radiation and weekly cisplatin .  Chemotherapy was withheld on October 06, 2021 due to cytopenia   - from note has compelted current treatment course, now under observation     4. Lung cancer screening - atherosclerosis and coronary atherosclerosis noted        AAA screen Male over 24 with tobacco history. 09/2011 abdominal US  without aneusyrm.  Past Medical History:  Diagnosis Date   Anticoagulant long-term use    eliquis --- managed by cardiology   Arthritis    osteoporosis   Atrial fibrillation, unspecified type [facility]) 2018   cardiologist--- dr j. Raynah Gomes   Bladder neoplasm    Bradycardia    COPD (chronic obstructive pulmonary disease) (HCC)    pulled from [facility] report. Added by [facility] 03/06/24   Gallbladder polyp    followed by surgeon, dr r. cathy (lov note in epic 06-27-2021 stated surgical management after pt has completed bladder cancer treatment   GERD (gastroesophageal reflux disease)    Heart murmur    History of drug abuse in remission (HCC)    per pt in remission since 02-26-1999   History of hepatitis C 12/2020   followed by  dr d. eartha flavors (aph/ [facility] and hepatology);  dx 03/ 2022, liver bx 01-12-2021 g2hepatitis, f2;  completed 12 wks treatment 04-14-2021 w/ epclusa, hcv undetectable 04-21-2021   Mitral valve regurgitation    followed by dr Breta Demedeiros---  last echo in epic 02-10-2021  moderate MR without stenosis   Osteoporosis    Wears dentures    full upper and lower partial   Wears hearing aid in left ear      Allergies[1]   Current Outpatient Medications  Medication Sig Dispense Refill   acetaminophen  (TYLENOL ) 500 MG tablet Take 500-1,000 mg by mouth every 6 (six) hours as needed for moderate pain.      alendronate (FOSAMAX) 70 MG tablet Take 70 mg by mouth once a week. Takes on Thursday's ;  Take with a full glass of water  on an empty stomach.     apixaban  (ELIQUIS ) 5 MG TABS tablet Take 1 tablet (5 mg total) by mouth 2 (two) times daily. 180 tablet 1   Ascorbic Acid (VITAMIN C) 1000 MG tablet Take 1,000 mg by mouth 2 (two) times daily.     calcium -vitamin D (OSCAL WITH D) 500-200 MG-UNIT tablet Take 1 tablet by mouth 2 (two) times daily.     CIPROFLOXACIN -DEXAMETHASONE  OT Place 3 drops in ear(s) daily as needed (ear infection).     digoxin  (LANOXIN ) 0.125 MG tablet Take 1 tablet (0.125 mg total) by mouth daily. 90 tablet 1   furosemide  (LASIX ) 40 MG tablet Take 1 tablet (40 mg total) by mouth daily as needed (Swelling). 30 tablet 11   loperamide (IMODIUM A-D) 2 MG tablet Take 2 mg by mouth 4 (four) times daily as needed for diarrhea or loose stools.     Magnesium  250 MG TABS Take 250 mg by mouth daily.     metoprolol  succinate (TOPROL -XL) 25 MG 24 hr tablet Take 1 tablet (25 mg total) by mouth daily. 90 tablet 1   midodrine  (PROAMATINE ) 2.5 MG tablet Take 1 tablet (2.5 mg total) by mouth 2 (two) times daily with a meal. 180 tablet 3   Multiple Vitamins-Minerals (MULTIVITAMIN PO) Take 1 tablet by mouth daily.     omeprazole (PRILOSEC) 20 MG capsule Take 20 mg by mouth daily.     Polyethyl Glycol-Propyl Glycol (SYSTANE OP) Place 1 drop into both eyes daily as needed (dry eyes).     potassium chloride  SA (KLOR-CON  M) 20 MEQ tablet Take 1 tablet by mouth once daily 90 tablet 1   pseudoephedrine (SUDAFED) 30 MG tablet Take 30 mg by mouth daily as needed for congestion.     sildenafil (REVATIO) 20 MG tablet Take 20-60 mg by mouth daily as needed (erectile dysfunction).     Current Facility-Administered Medications  Medication Dose Route Frequency Provider Last Rate Last Admin   sodium chloride  flush (NS) 0.9 % injection 3 mL  3 mL Intravenous Q12H Miriam Norris, NP         Past Surgical  History:  Procedure Laterality Date   APPENDECTOMY     age 64   73 LIFT AND BLEPHAROPLASTY Bilateral    APPROX. 2017   CATARACT EXTRACTION W/ INTRAOCULAR LENS IMPLANT Bilateral 2018   HEMORROIDECTOMY     2010 @[facility]  and 2018 @ [facility]   HYDROCELE EXCISION Right 08/13/2017   Procedure: RIGHT HYDROCELECTOMY;  Surgeon: Sherrilee Belvie CROME, MD;  Location: [facility];  Service: Urology;  Laterality: Right;   HYDROCELE EXCISION Right 11/12/2017   Procedure: RIGHT HYDROCELECTOMY;  Surgeon: Sherrilee,  Belvie CROME, MD;  Location: [facility];  Service: Urology;  Laterality: Right;   INGUINAL HERNIA REPAIR Bilateral    APPROX.   2010;   WITH UMBILICAL HERNIA AND EPIGRASTIC HERNIA REPAIRS   INGUINAL HERNIA REPAIR  09/11/2011   Procedure: HERNIA REPAIR INGUINAL ADULT;  Surgeon: Elsie GORMAN Holland;  Location: [facility];  Service: General;  Laterality: Right;  Recurrent Right Inguinal Hernia Repair   MANDIBLE RECONSTRUCTION     x2  last one 1980;   from [facility]  ( has retained hardware)   MIDDLE EAR SURGERY Right 02/19/2015   by Dr Karis ;  tympanomastoidectomy   RIGHT/LEFT HEART CATH AND CORONARY ANGIOGRAPHY N/A 01/30/2023   Procedure: RIGHT/LEFT HEART CATH AND CORONARY ANGIOGRAPHY;  Surgeon: Anner Alm ORN, MD;  Location: [facility] INVASIVE CV LAB;  Service: Cardiovascular;  Laterality: N/A;   ROTATOR CUFF REPAIR Left 03/31/2016   TRANSURETHRAL RESECTION OF BLADDER TUMOR N/A 06/17/2021   Procedure: TRANSURETHRAL RESECTION OF BLADDER TUMOR (TURBT) WITH POST OPERATIVE INSTILLATION OF GEMCITABINE ;  Surgeon: Nieves Cough, MD;  Location: [facility];  Service: Urology;  Laterality: N/A;   TRANSURETHRAL RESECTION OF BLADDER TUMOR N/A 08/16/2021   Procedure: TRANSURETHRAL RESECTION OF BLADDER TUMOR (TURBT);  Surgeon: Nieves Cough, MD;  Location[facility];  Service: Urology;  Laterality: N/A;   TYMPANOPLASTY Right 05/02/2016   Procedure: TYMPANOPLASTY;  Surgeon: Daniel Karis, MD;  Location: [facility];  Service: ENT;  Laterality: Right;   UMBILICAL HERNIA REPAIR  2017     Allergies[2]    Family History  Problem Relation Age of Onset   Aortic aneurysm Other        Deceased   Cancer Other        Deceased   Cardiomyopathy Other        Alive   Anesthesia problems Neg Hx    Hypotension Neg Hx    Malignant hyperthermia Neg Hx    Pseudochol deficiency Neg Hx      Social History Kenneth [name] reports that he quit smoking about 17 years ago. His smoking use included cigarettes. He started smoking about 38 years ago. He has a 21 pack-year smoking history. He has never been exposed to tobacco smoke. He quit smokeless tobacco use about 17 years ago. Kenneth [name] reports that he does not currently use alcohol .   Physical Examination Today's Vitals   09/23/24 1503  BP: 130/82  Pulse: 85  SpO2: 96%  Weight: 199 lb (90.3 kg)  Height: 6' (1.829 m)   Body mass index is 26.99 kg/m.  Gen: resting comfortably, no acute distress HEENT: no scleral icterus, pupils equal round and reactive, no palptable cervical adenopathy,  CV: irreg Resp: Clear to auscultation bilaterally GI: abdomen is soft, non-tender, non-distended, normal bowel sounds, no hepatosplenomegaly MSK: extremities are warm, no edema.  Skin: warm, no rash Neuro:  no focal deficits Psych: appropriate affect   Diagnostic Studies  11/2017 echo Study Conclusions   - Left ventricle: The cavity size was mildly dilated. Wall   thickness was increased in a pattern of mild LVH. Systolic   function was normal. The estimated ejection fraction was in the   range of 50% to 55%. Although no diagnostic regional wall motion   abnormality was identified, this possibility cannot be completely   excluded on the basis of this study. The study was not   technically sufficient to allow evaluation of LV diastolic   dysfunction due to atrial fibrillation. - Aortic  valve: Moderately calcified annulus. Trileaflet. -  Mitral valve: Mildly thickened leaflets . Systolic bowing without   prolapse. There was moderate regurgitation. - Left atrium: The atrium was mildly dilated. - Right ventricle: The cavity size was mildly dilated. - Right atrium: The atrium was moderately dilated. Central venous   pressure (est): 8 mm Hg. - Atrial septum: No defect or patent foramen ovale was identified. - Tricuspid valve: There was mild-moderate regurgitation. - Pulmonary arteries: PA peak pressure: 29 mm Hg (S). - Pericardium, extracardiac: A small pericardial effusion was   identified anterior to the heart.       09/2021 Zio patch 14 day monitor Patient was in afib throughout the study, range 64-213 bpm, average HR 110 Rare ventricular ectopy in the form of isolated PVCs, couplets, triplets. Rare short episodes of wide complex tachycardia that appear more consistent with SVT with aberrancy as opposed to NSVT No symptoms reported Overall 100% afib burden with very high heart rates at times.     Patch Wear Time:  14 days and 0 hours (2022-12-06T07:52:31-0500 to 2022-12-20T07:52:35-0500)   7 Ventricular Tachycardia runs occurred, the run with the fastest interval lasting 4 beats with a max rate of 197 bpm, the longest lasting 11 beats with an avg rate of 145 bpm. Atrial Fibrillation occurred continuously (100% burden), ranging from 64-213  bpm (avg of 110 bpm). Intermittent Bundle Terrence Wishon Block was present. Isolated VEs were rare (<1.0%, 14535), VE Couplets were rare (<1.0%, 1056), and VE Triplets were rare (<1.0%, 13). Ventricular Bigeminy was present. MD notification criteria for Rapid  Atrial Fibrillation met - report posted prior to notification per account request (ES).   11/2021 echo 1. Left ventricular ejection fraction, by estimation, is 35 to 40%. The  left ventricle has moderately decreased function. The left ventricle  demonstrates global hypokinesis. There is moderate left ventricular  hypertrophy. Left  ventricular diastolic  parameters are indeterminate.   2. Right ventricular systolic function is moderately reduced. The right  ventricular size is severely enlarged. There is mildly elevated pulmonary  artery systolic pressure.   3. Left atrial size was severely dilated.   4. Right atrial size was severely dilated.   5. The mitral valve is normal in structure. Mild mitral valve  regurgitation. No evidence of mitral stenosis.   6. The tricuspid valve is abnormal. Tricuspid valve regurgitation is  moderate to severe.   7. The aortic valve is tricuspid. Aortic valve regurgitation is not  visualized. No aortic stenosis is present.   8. The inferior vena cava is dilated in size with <50% respiratory  variability, suggesting right atrial pressure of 15 mmHg.      05/2022 echo 1. Left ventricular ejection fraction, by estimation, is 60 to 65%. The  left ventricle has normal function. The left ventricle has no regional  wall motion abnormalities. The average left ventricular global  longitudinal strain is -17.7 %. The global  longitudinal strain is normal.   2. Right ventricular systolic function is normal. The right ventricular  size is normal.   3. The aortic valve is tricuspid.   4. Limited echo to evaluate LV function          Assessment and Plan     1. Orthostatic dizzienss - resolved after starting midodrine , will continue - he will continue regular hydration, lasix  changed to just prn - continue current therapy   F/u 4 months             Dorn PHEBE Ross, M.D.     [  1] No Known Allergies [2] No Known Allergies

## 2024-09-23 NOTE — Patient Instructions (Signed)
 Medication Instructions:  Your physician recommends that you continue on your current medications as directed. Please refer to the Current Medication list given to you today.  *If you need a refill on your cardiac medications before your next appointment, please call your pharmacy*  Lab Work: None If you have labs (blood work) drawn today and your tests are completely normal, you will receive your results only by: MyChart Message (if you have MyChart) OR A paper copy in the mail If you have any lab test that is abnormal or we need to change your treatment, we will call you to review the results.  Testing/Procedures: None  Follow-Up: At [facility], you and your health needs are our priority.  As part of our continuing mission to provide you with exceptional heart care, our providers are all part of one team.  This team includes your primary Cardiologist (physician) and Advanced Practice Providers or APPs (Physician Assistants and Nurse Practitioners) who all work together to provide you with the care you need, when you need it.  Your next appointment:   4 month(s)  Provider:   You may see Dina Rich, MD or one of the following Advanced Practice Providers on your designated Care Team:   Randall An, PA-C  [place]ia [place], [place] Jacolyn Reedy, [place]     We recommend signing up for the patient portal called "MyChart".  Sign up information is provided on this After Visit Summary.  MyChart is used to connect with patients for Virtual Visits (Telemedicine).  Patients are able to view lab/test results, encounter notes, upcoming appointments, etc.  Non-urgent messages can be sent to your provider as well.   To learn more about what you can do with MyChart, go to [email or web address].   Other Instructions

## 2024-09-24 ENCOUNTER — Ambulatory Visit: Payer: Self-pay | Admitting: Cardiology

## 2024-10-13 ENCOUNTER — Other Ambulatory Visit: Payer: Self-pay | Admitting: *Deleted

## 2024-10-13 NOTE — Patient Instructions (Signed)
 Visit Information  Thank you for taking time to visit with me today. Please don't hesitate to contact me if I can be of assistance to you before our next scheduled appointment.  Your next care management appointment is by telephone on 11/13/2024 at 10:00 AM with Warren Quivers, RNCM  Please call the care guide team at [phone] if you need to cancel, schedule, or reschedule an appointment.   Please call the Suicide and Crisis Lifeline: 988 call the [place]  National Suicide Prevention Lifeline: 312-165-6865 or TTY: 413-134-4583 TTY 9896886024) to talk to a trained counselor call 1-800-273-TALK (toll free, 24 hour hotline) if you are experiencing a Mental Health or Behavioral Health Crisis or need someone to talk to.   Olam Ku, RN, BSN [place]  [facility], [facility] RN Care Manager Direct Dial: [phone]  Fax: [phone]

## 2024-10-13 NOTE — Patient Outreach (Signed)
 Complex Care Management   Visit Note  10/13/2024  Name:  Kenneth [name] MRN: [id number] DOB: Aug 25, 1951  Situation: Referral received for Complex Care Management related to Atrial Fibrillation I obtained verbal consent from Patient.  Visit completed with Patient  on the phone  Background:   Past Medical History:  Diagnosis Date   Anticoagulant long-term use    eliquis --- managed by cardiology   Arthritis    osteoporosis   Atrial fibrillation, unspecified type [facility]) 2018   cardiologist--- dr j. branch   Bladder neoplasm    Bradycardia    COPD (chronic obstructive pulmonary disease) (HCC)    pulled from [facility] report. Added by [facility] 03/06/24   Gallbladder polyp    followed by surgeon, dr r. cathy (lov note in epic 06-27-2021 stated surgical management after pt has completed bladder cancer treatment   GERD (gastroesophageal reflux disease)    Heart murmur    History of drug abuse in remission (HCC)    per pt in remission since 02-26-1999   History of hepatitis C 12/2020   followed by dr d. eartha flavors (aph/ [facility] and hepatology);  dx 03/ 2022, liver bx 01-12-2021 g2hepatitis, f2;  completed 12 wks treatment 04-14-2021 w/ epclusa, hcv undetectable 04-21-2021   Mitral valve regurgitation    followed by dr branch---  last echo in epic 02-10-2021  moderate MR without stenosis   Osteoporosis    Wears dentures    full upper and lower partial   Wears hearing aid in left ear     Assessment: Patient Reported Symptoms:  Cognitive Cognitive Status: Able to follow simple commands, Alert and oriented to person, place, and time, Normal speech and language skills   Health Maintenance Behaviors: Annual physical exam Health Facilitated by: Rest  Neurological Neurological Review of Symptoms: No symptoms reported Neurological Management Strategies: Coping strategies, Routine screening Neurological Self-Management Outcome: 4 (good)  HEENT HEENT Symptoms Reported: No  symptoms reported HEENT Management Strategies: Coping strategies, Routine screening, Medication therapy HEENT Self-Management Outcome: 4 (good) HEENT Comment: Patient continues to f/u q 4 months to Dr. Karis (ENT) for cleaning due to hx Mastoiditis with next f/u 12/01/2024 Ear problem(s)  Cardiovascular Cardiovascular Symptoms Reported: No symptoms reported Other Cardiovascular Symptoms: Pt taking Midodrine  with good relief with no reported side effects or related symptoms. Does patient have uncontrolled Hypertension?: No Cardiovascular Management Strategies: Coping strategies, Routine screening Cardiovascular Self-Management Outcome: 4 (good) Cardiovascular Comment: Denies any episodic occurrences as pt remains asymptomatic with no acute events.  Respiratory Respiratory Symptoms Reported: No symptoms reported Other Respiratory Symptoms: History of SOB with recovery with rest Respiratory Management Strategies: Adequate rest, Coping strategies, Routine screening Respiratory Self-Management Outcome: 4 (good)  Endocrine Endocrine Symptoms Reported: No symptoms reported Is patient diabetic?: No Endocrine Self-Management Outcome: 4 (good)  Gastrointestinal Gastrointestinal Symptoms Reported: No symptoms reported Gastrointestinal Management Strategies: Coping strategies Gastrointestinal Self-Management Outcome: 4 (good)    Genitourinary Genitourinary Symptoms Reported: No symptoms reported Genitourinary Management Strategies: Coping strategies Genitourinary Self-Management Outcome: 4 (good) Genitourinary Comment: Continues to follow up Dr. Nieves for Urologist (bladder cancer history)  Integumentary Integumentary Symptoms Reported: No symptoms reported Skin Management Strategies: Routine screening, Coping strategies Skin Self-Management Outcome: 4 (good)  Musculoskeletal Musculoskelatal Symptoms Reviewed: No symptoms reported Musculoskeletal Management Strategies: Routine screening, Coping  strategies, Adequate rest Musculoskeletal Self-Management Outcome: 4 (good)      Psychosocial Psychosocial Symptoms Reported: No symptoms reported Behavioral Management Strategies: Coping strategies, Support system, Support group, Abstinence from substances Behavioral  Health Self-Management Outcome: 5 (very good) Behavioral Health Comment: Continue AA meeting 1-2 times Major Change/Loss/Stressor/Fears (CP): Denies Techniques to Cope with Loss/Stress/Change: Support group, Diversional activities (public speaking to certain facilities to assist other individuals undergo ADHA/ADD) Quality of Family Relationships: helpful, involved, supportive Do you feel physically threatened by others?: No      There were no vitals filed for this visit. Pain Scale: 0-10 Pain Score: 0-No pain  Medications Reviewed Today     Reviewed by Alvia Olam BIRCH, RN (Registered Nurse) on 10/13/24 at 1007  Med List Status: <None>   Medication Order Taking? Sig Documenting Provider Last Dose Status Informant  acetaminophen  (TYLENOL ) 500 MG tablet [id number] Yes Take 500-1,000 mg by mouth every 6 (six) hours as needed for moderate pain. [provider]  Active Self  alendronate (FOSAMAX) 70 MG tablet [id number] Yes Take 70 mg by mouth once a week. Takes on Thursday's ;  Take with a full glass of water  on an empty stomach. [provider]  Active Self  apixaban  (ELIQUIS ) 5 MG TABS tablet [id number] Yes Take 1 tablet (5 mg total) by mouth 2 (two) times daily. Miriam Norris, NP  Active   Ascorbic Acid (VITAMIN C) 1000 MG tablet [id number] Yes Take 1,000 mg by mouth 2 (two) times daily. [provider]  Active Self  calcium -vitamin D (OSCAL WITH D) 500-200 MG-UNIT tablet [id number] Yes Take 1 tablet by mouth 2 (two) times daily. [provider]  Active Self  CIPROFLOXACIN -DEXAMETHASONE  OT [id number] Yes Place 3 drops in ear(s) daily as needed (ear infection). [provider]  Active  Self  digoxin  (LANOXIN ) 0.125 MG tablet [id number] Yes Take 1 tablet (0.125 mg total) by mouth daily. Miriam Norris, NP  Active   furosemide  (LASIX ) 40 MG tablet [id number] Yes Take 1 tablet (40 mg total) by mouth daily as needed (Swelling). Alvan Dorn FALCON, MD  Active   loperamide (IMODIUM A-D) 2 MG tablet [id number] Yes Take 2 mg by mouth 4 (four) times daily as needed for diarrhea or loose stools. [provider]  Active Self  Magnesium  250 MG TABS [id number] Yes Take 250 mg by mouth daily. [provider]  Active Self  metoprolol  succinate (TOPROL -XL) 25 MG 24 hr tablet [id number] Yes Take 1 tablet (25 mg total) by mouth daily. Miriam Norris, NP  Active   midodrine  (PROAMATINE ) 2.5 MG tablet [id number] Yes Take 1 tablet (2.5 mg total) by mouth 2 (two) times daily with a meal. Alvan Dorn FALCON, MD  Active   Multiple Vitamins-Minerals (MULTIVITAMIN PO) [id number] Yes Take 1 tablet by mouth daily. [provider]  Active Self  omeprazole (PRILOSEC) 20 MG capsule [id number] Yes Take 20 mg by mouth daily. [provider]  Active Self  Polyethyl Glycol-Propyl Glycol (SYSTANE OP) [id number] Yes Place 1 drop into both eyes daily as needed (dry eyes). [provider]  Active Self  potassium chloride  SA (KLOR-CON  M) 20 MEQ tablet [id number] Yes Take 1 tablet by mouth once daily Branch, Jonathan F, MD  Active   pseudoephedrine (SUDAFED) 30 MG tablet [id number] Yes Take 30 mg by mouth daily as needed for congestion. [provider]  Active Self  sildenafil (REVATIO) 20 MG tablet [id number] Yes Take 20-60 mg by mouth daily as needed (erectile dysfunction). [provider]  Active Self  sodium chloride  flush (NS) 0.9 % injection 3 mL [id number]   Miriam Norris, NP  Active             Recommendation:  PCP Follow-up Continue Current Plan of Care  Follow Up Plan:   Telephone follow up appointment date/time:  11/13/2024 @ 10:00am with Warren Quivers, RNCM   Olam Ku, RN, BSN [place]  [facility], [facility] RN Care Manager Direct Dial: [phone]  Fax: [phone]

## 2024-10-29 ENCOUNTER — Other Ambulatory Visit: Payer: Self-pay | Admitting: Nurse Practitioner

## 2024-11-13 ENCOUNTER — Other Ambulatory Visit: Payer: Self-pay

## 2024-11-13 ENCOUNTER — Telehealth: Payer: Self-pay | Admitting: Cardiology

## 2024-11-13 NOTE — Telephone Encounter (Signed)
 Pt states that he has been recording his blood pressure readings more frequently. Patient noticed that his readings are becoming higher. He states that he is feeling fine and would like to know if he should be concerned or make any changes. Please advise.

## 2024-11-13 NOTE — Telephone Encounter (Signed)
 Pt c/o BP issue: STAT if pt c/o blurred vision, one-sided weakness or slurred speech.  STAT if BP is GREATER than 180/120 TODAY.  STAT if BP is LESS than 90/60 and SYMPTOMATIC TODAY  1. What is your BP concern? BP too low   2. Have you taken any BP medication today?Yes  3. What are your last 5 BP readings? 126/86 126/68 152/100 150/90 156/83  4. Are you having any other symptoms (ex. Dizziness, headache, blurred vision, passed out)? No

## 2024-11-13 NOTE — Patient Outreach (Signed)
 Complex Care Management   Visit Note  11/13/2024  Name:  Kenneth [name] MRN: [id number] DOB: 03-09-1951  Situation: Referral received for Complex Care Management related to A-fib. I obtained verbal consent from Patient.  Visit completed with Patient  on the phone  Background:   Past Medical History:  Diagnosis Date   Anticoagulant long-term use    eliquis --- managed by cardiology   Arthritis    osteoporosis   Atrial fibrillation, unspecified type [facility]) 2018   cardiologist--- dr j. branch   Bladder neoplasm    Bradycardia    COPD (chronic obstructive pulmonary disease) (HCC)    pulled from [facility] report. Added by [facility] 03/06/24   Gallbladder polyp    followed by surgeon, dr r. cathy (lov note in epic 06-27-2021 stated surgical management after pt has completed bladder cancer treatment   GERD (gastroesophageal reflux disease)    Heart murmur    History of drug abuse in remission (HCC)    per pt in remission since 02-26-1999   History of hepatitis C 12/2020   followed by dr d. eartha flavors (aph/ [facility] and hepatology);  dx 03/ 2022, liver bx 01-12-2021 g2hepatitis, f2;  completed 12 wks treatment 04-14-2021 w/ epclusa, hcv undetectable 04-21-2021   Mitral valve regurgitation    followed by dr branch---  last echo in epic 02-10-2021  moderate MR without stenosis   Osteoporosis    Wears dentures    full upper and lower partial   Wears hearing aid in left ear     Assessment: Patient Reported Symptoms:  Cognitive Cognitive Status: No symptoms reported, Alert and oriented to person, place, and time, Normal speech and language skills Cognitive/Intellectual Conditions Management [RPT]: None reported or documented in medical history or problem list   Health Maintenance Behaviors: Sleep adequate, Annual physical exam Healing Pattern: Average Health Facilitated by: Stress management, Rest  Neurological Neurological Review of Symptoms: No symptoms  reported Neurological Management Strategies: Adequate rest, Routine screening Neurological Self-Management Outcome: 4 (good)  HEENT HEENT Symptoms Reported: No symptoms reported HEENT Management Strategies: Coping strategies, Medication therapy HEENT Self-Management Outcome: 4 (good) HEENT Comment: 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 No reported side effects. Hs of A-fib on Eliquis  Does patient have uncontrolled Hypertension?: No Cardiovascular Management Strategies: Routine screening, Diet modification, Adequate rest Weight: 195 lb (88.5 kg) Cardiovascular Self-Management Outcome: 4 (good)  Respiratory Respiratory Symptoms Reported: No symptoms reported Respiratory Management Strategies: Routine screening Respiratory Self-Management Outcome: 4 (good)  Endocrine Endocrine Symptoms Reported: No symptoms reported Is patient diabetic?: No Endocrine Self-Management Outcome: 4 (good)  Gastrointestinal Gastrointestinal Symptoms Reported: No symptoms reported Additional Gastrointestinal Details: Reports normal BM's daily Gastrointestinal Management Strategies: Coping strategies, Adequate rest Gastrointestinal Self-Management Outcome: 4 (good)    Genitourinary Genitourinary Symptoms Reported: No symptoms reported Additional Genitourinary Details: hx of bladder cancer- sees urologist every 4 months- no concerns at time of call. Follow up with urologist scheduled for May 2026 Genitourinary Management Strategies: Adequate rest Genitourinary Self-Management Outcome: 4 (good)  Integumentary Integumentary Symptoms Reported: No symptoms reported Skin Management Strategies: Routine screening Skin Self-Management Outcome: 4 (good)  Musculoskeletal Musculoskelatal Symptoms Reviewed: No symptoms reported Musculoskeletal  Management Strategies: Adequate rest Musculoskeletal Self-Management Outcome: 4 (good) Falls in the past year?: No Number of falls in past year: 1 or less Was there an injury with Fall?:  No Fall Risk Category Calculator: 0 Patient Fall Risk Level: Low Fall Risk Patient at Risk for Falls Due to: No Fall Risks Fall risk Follow up: Falls prevention discussed  Psychosocial Psychosocial Symptoms Reported: No symptoms reported Behavioral Management Strategies: Adequate rest, Support system, Coping strategies Behavioral Health Self-Management Outcome: 4 (good) Behavioral Health Comment: Continues with AA meetings. Major Change/Loss/Stressor/Fears (CP): Denies Techniques to Cope with Loss/Stress/Change: Support group, Diversional activities Quality of Family Relationships: helpful, involved, supportive Do you feel physically threatened by others?: No    11/13/2024    PHQ2-9 Depression Screening   Little interest or pleasure in doing things Not at all  Feeling down, depressed, or hopeless Not at all  PHQ-2 - Total Score 0  Trouble falling or staying asleep, or sleeping too much    Feeling tired or having little energy    Poor appetite or overeating     Feeling bad about yourself - or that you are a failure or have let yourself or your family down    Trouble concentrating on things, such as reading the newspaper or watching television    Moving or speaking so slowly that other people could have noticed.  Or the opposite - being so fidgety or restless that you have been moving around a lot more than usual    Thoughts that you would be better off dead, or hurting yourself in some way    PHQ2-9 Total Score    If you checked off any problems, how difficult have these problems made it for you to do your work, take care of things at home, or get along with other people    Depression Interventions/Treatment      Today's Vitals   11/13/24 1010  BP: 126/86  Weight: 195 lb (88.5 kg)   Pain Scale:  0-10 Pain Score: 0-No pain  Medications Reviewed Today     Reviewed by Leodis Warren DEL, RN (Registered Nurse) on 11/13/24 at 1002  Med List Status: <None>   Medication Order Taking? Sig Documenting Provider Last Dose Status Informant  acetaminophen  (TYLENOL ) 500 MG tablet [id number] Yes Take 500-1,000 mg by mouth every 6 (six) hours as needed for moderate pain. [provider]  Active Self  alendronate (FOSAMAX) 70 MG tablet [id number] Yes Take 70 mg by mouth once a week. Takes on Thursday's ;  Take with a full glass of water  on an empty stomach. [provider]  Active Self  apixaban  (ELIQUIS ) 5 MG TABS tablet [id number] Yes Take 1 tablet (5 mg total) by mouth 2 (two) times daily. Miriam Norris, NP  Active   Ascorbic Acid (VITAMIN C) 1000 MG tablet [id number] Yes Take 1,000 mg by mouth 2 (two) times daily. [provider]  Active Self  calcium -vitamin D (OSCAL WITH D) 500-200 MG-UNIT tablet [id number] Yes Take 1 tablet by mouth 2 (two) times daily. [provider]  Active Self  CIPROFLOXACIN -DEXAMETHASONE  OT [id number] Yes Place 3 drops in ear(s) daily as needed (ear infection). [provider]  Active Self  digoxin  (LANOXIN ) 0.125 MG tablet [id number] Yes Take 1 tablet by mouth once daily Miriam Norris, NP  Active   furosemide  (LASIX ) 40 MG tablet [id number] Yes Take 1 tablet (40 mg total) by mouth daily as needed (Swelling). Alvan Dorn FALCON, MD  Active   loperamide (IMODIUM A-D) 2 MG tablet [id number] Yes Take 2 mg by mouth 4 (four) times daily as needed for diarrhea or loose stools. [provider]  Active Self  Magnesium  250  MG TABS [id number] Yes Take 250 mg by mouth daily. [provider]  Active Self  metoprolol  succinate (TOPROL -XL) 25 MG 24 hr tablet [id number] Yes Take 1 tablet (25 mg total) by mouth daily. Miriam Norris, NP  Active   midodrine  (PROAMATINE ) 2.5 MG tablet [id number] Yes Take 1 tablet (2.5 mg total) by mouth 2  (two) times daily with a meal. Alvan Dorn FALCON, MD  Active   Multiple Vitamins-Minerals (MULTIVITAMIN PO) [id number] Yes Take 1 tablet by mouth daily. [provider]  Active Self  omeprazole (PRILOSEC) 20 MG capsule [id number] Yes Take 20 mg by mouth daily. [provider]  Active Self  Polyethyl Glycol-Propyl Glycol (SYSTANE OP) [id number] Yes Place 1 drop into both eyes daily as needed (dry eyes). [provider]  Active Self  potassium chloride  SA (KLOR-CON  M) 20 MEQ tablet [id number] Yes Take 1 tablet by mouth once daily Branch, Jonathan F, MD  Active   pseudoephedrine (SUDAFED) 30 MG tablet [id number] Yes Take 30 mg by mouth daily as needed for congestion. [provider]  Active Self  sildenafil (REVATIO) 20 MG tablet [id number] Yes Take 20-60 mg by mouth daily as needed (erectile dysfunction). [provider]  Active Self  sodium chloride  flush (NS) 0.9 % injection 3 mL [id number]   Miriam Norris, NP  Active             Recommendation:   Continue Current Plan of Care Call Cardiology office due to occasional elevated BP readings.  Follow Up Plan:   Telephone follow up appointment date/time:  12/10/24 at 11 am  Warren Quivers RN CM Population Health-Complex Care Management [facility] [phone]

## 2024-11-13 NOTE — Telephone Encounter (Signed)
 Spoke to pt who stated that he feels better now then he has in a while. Pt voiced understanding that provider is okay with slightly elevated bp's at this time.   Pt had no further questions or concerns at this time.

## 2024-11-13 NOTE — Patient Instructions (Signed)
 Visit Information  Thank you for taking time to visit with me today. Please don't hesitate to contact me if I can be of assistance to you before our next scheduled appointment.  Your next care management appointment is by telephone on 12/10/24 at 11 am.  Please call the care guide team at [phone] if you need to cancel, schedule, or reschedule an appointment.   Please call the Suicide and Crisis Lifeline: 988 call the [place]  National Suicide Prevention Lifeline: 216-725-4946 or TTY: (684)019-4108 TTY 352-057-0929) to talk to a trained counselor call 1-800-273-TALK (toll free, 24 hour hotline) if you are experiencing a Mental Health or Behavioral Health Crisis or need someone to talk to.  Warren Quivers RN CM Population Health-Complex Care Management [facility] [phone]

## 2024-12-01 ENCOUNTER — Ambulatory Visit (INDEPENDENT_AMBULATORY_CARE_PROVIDER_SITE_OTHER): Admitting: Otolaryngology

## 2024-12-10 ENCOUNTER — Telehealth

## 2025-01-22 ENCOUNTER — Ambulatory Visit: Admitting: Student
